# Patient Record
Sex: Female | Born: 1960
Health system: Southern US, Community
[De-identification: ages and names within clinical notes are randomized; demographics above are authoritative.]

## PROBLEM LIST (undated history)

## (undated) DIAGNOSIS — I10 Essential (primary) hypertension: Secondary | ICD-10-CM

## (undated) DIAGNOSIS — M545 Low back pain, unspecified: Secondary | ICD-10-CM

## (undated) DIAGNOSIS — G8929 Other chronic pain: Secondary | ICD-10-CM

## (undated) DIAGNOSIS — G894 Chronic pain syndrome: Secondary | ICD-10-CM

## (undated) DIAGNOSIS — F419 Anxiety disorder, unspecified: Secondary | ICD-10-CM

## (undated) DIAGNOSIS — G2581 Restless legs syndrome: Secondary | ICD-10-CM

## (undated) DIAGNOSIS — E119 Type 2 diabetes mellitus without complications: Secondary | ICD-10-CM

## (undated) DIAGNOSIS — M069 Rheumatoid arthritis, unspecified: Secondary | ICD-10-CM

## (undated) HISTORY — DX: Restless legs syndrome: G25.81

## (undated) HISTORY — DX: Anxiety disorder, unspecified: F41.9

## (undated) HISTORY — DX: Essential (primary) hypertension: I10

## (undated) HISTORY — DX: Rheumatoid arthritis, unspecified: M06.9

---
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
## 2005-10-07 ENCOUNTER — Inpatient Hospital Stay: Payer: Self-pay | Admitting: Endocrinology

## 2005-10-07 ENCOUNTER — Other Ambulatory Visit: Payer: Self-pay

## 2005-10-10 ENCOUNTER — Emergency Department: Payer: Self-pay | Admitting: Unknown Physician Specialty

## 2005-10-10 ENCOUNTER — Other Ambulatory Visit: Payer: Self-pay

## 2005-10-21 ENCOUNTER — Ambulatory Visit: Payer: Self-pay | Admitting: Endocrinology

## 2005-10-26 ENCOUNTER — Ambulatory Visit: Payer: Self-pay | Admitting: Rheumatology

## 2005-12-13 ENCOUNTER — Emergency Department: Payer: Self-pay | Admitting: General Practice

## 2009-01-08 ENCOUNTER — Emergency Department: Payer: Self-pay | Admitting: Emergency Medicine

## 2009-05-03 ENCOUNTER — Emergency Department: Payer: Self-pay | Admitting: Emergency Medicine

## 2009-05-10 ENCOUNTER — Emergency Department: Payer: Self-pay | Admitting: Emergency Medicine

## 2009-05-26 ENCOUNTER — Ambulatory Visit: Payer: Self-pay | Admitting: Neurology

## 2010-04-13 ENCOUNTER — Encounter: Admission: RE | Admit: 2010-04-13 | Discharge: 2010-04-13 | Payer: Self-pay | Admitting: Neurology

## 2010-06-03 ENCOUNTER — Emergency Department: Payer: Self-pay | Admitting: Emergency Medicine

## 2011-04-18 ENCOUNTER — Emergency Department: Payer: Self-pay | Admitting: Emergency Medicine

## 2012-08-17 ENCOUNTER — Emergency Department: Payer: Self-pay | Admitting: Emergency Medicine

## 2012-08-24 ENCOUNTER — Emergency Department: Payer: Self-pay | Admitting: Emergency Medicine

## 2012-08-27 ENCOUNTER — Emergency Department: Payer: Self-pay | Admitting: Emergency Medicine

## 2012-09-23 ENCOUNTER — Emergency Department: Payer: Self-pay | Admitting: Internal Medicine

## 2012-10-12 ENCOUNTER — Emergency Department: Payer: Self-pay | Admitting: Emergency Medicine

## 2012-10-12 LAB — URINALYSIS, COMPLETE
Glucose,UR: NEGATIVE mg/dL (ref 0–75)
Nitrite: NEGATIVE

## 2012-10-12 LAB — CBC
HCT: 40.6 % (ref 35.0–47.0)
HGB: 13.5 g/dL (ref 12.0–16.0)
MCHC: 33.2 g/dL (ref 32.0–36.0)
Platelet: 322 10*3/uL (ref 150–440)

## 2012-10-12 LAB — COMPREHENSIVE METABOLIC PANEL
Albumin: 3.3 g/dL — ABNORMAL LOW (ref 3.4–5.0)
Alkaline Phosphatase: 122 U/L (ref 50–136)
Anion Gap: 8 (ref 7–16)
BUN: 14 mg/dL (ref 7–18)
Bilirubin,Total: 0.4 mg/dL (ref 0.2–1.0)
Co2: 25 mmol/L (ref 21–32)
EGFR (African American): >60
Total Protein: 7.4 g/dL (ref 6.4–8.2)

## 2012-10-12 LAB — LIPASE, BLOOD: Lipase: 123 U/L (ref 73–393)

## 2013-02-04 ENCOUNTER — Emergency Department: Payer: Self-pay | Admitting: Emergency Medicine

## 2013-06-09 ENCOUNTER — Emergency Department: Payer: Self-pay | Admitting: Emergency Medicine

## 2013-06-09 LAB — URINALYSIS, COMPLETE
Leukocyte Esterase: NEGATIVE
Protein: NEGATIVE
RBC,UR: 2 /HPF (ref 0–5)
WBC UR: 1 /HPF (ref 0–5)

## 2013-09-20 ENCOUNTER — Ambulatory Visit (INDEPENDENT_AMBULATORY_CARE_PROVIDER_SITE_OTHER): Payer: 59 | Admitting: Neurology

## 2013-09-20 ENCOUNTER — Encounter: Payer: Self-pay | Admitting: Neurology

## 2013-09-20 ENCOUNTER — Ambulatory Visit: Payer: Self-pay | Admitting: Neurology

## 2013-09-20 VITALS — BP 144/72 | HR 80 | Resp 25 | Ht 68.0 in | Wt 155.0 lb

## 2013-09-20 DIAGNOSIS — G2581 Restless legs syndrome: Secondary | ICD-10-CM | POA: Insufficient documentation

## 2013-09-20 DIAGNOSIS — G609 Hereditary and idiopathic neuropathy, unspecified: Secondary | ICD-10-CM

## 2013-09-20 NOTE — Progress Notes (Signed)
Karen Chen was seen today in neurologic consultation at the request of Dr. Malvin Johns for second opinion re: RLS.  Her PCP is Elmo Putt, MD.  Dr. Geralyn Flash records were reviewed.    The pt presents with her husband who supplements the hx.  The pt reports that 6 years ago, she began with non stop, 24 hour creepy crawly sensation on the legs.  Then she started on the requip, which helped when she went on it initially.  She states that sx's would get better and worse with time and medications were added.  Stalevo was added, and again it was initially helpful.  However, sx's again returned and she was recently taken off of it.  She states that the RLS was got much worse after the stalevo was d/c.  On a side note, ritalin was added for ADD and initially she had so much energy that she forgot about the RLS but then it quit helping her concentration so she d/c that.   She does not think that it affected the RLS.  She remains on klonopin bid and she thinks that it doesn't help at all for the RLS, but it does help the anxiety.  She takes neurontin to help her sleep.  She states that Dr. Geralyn Flash plan is to get her off of the requip and she is worried about that because she cannot sleep without it.  She is on zoloft and has been on that for 2 months.  She is not sure if that made it worse.    The patient has been on numerous medications for restless leg.  She has been on Mirapex, Xanax, Requip, Stalevo, gabapentin, ferrous sulfate, hydrocodone, Pamelor, clonazepam, trazodone, tramadol.  PREVIOUS MEDICATIONS: n/a  ALLERGIES:   Allergies  Allergen Reactions  . Indocin [Indomethacin]   . Methotrexate Derivatives   . Remicade [Infliximab] Hives    CURRENT MEDICATIONS:     Medication List       This list is accurate as of: 09/20/13  3:24 PM.  Always use your most recent med list.               aspirin 81 MG tablet  Take 81 mg by mouth daily.     clonazePAM 1 MG tablet  Commonly known as:  KLONOPIN   Take 1 mg by mouth 3 (three) times daily as needed for anxiety.     gabapentin 300 MG capsule  Commonly known as:  NEURONTIN  Take 300 mg by mouth 3 (three) times daily.     hydrochlorothiazide 12.5 MG tablet  Commonly known as:  HYDRODIURIL  Take 12.5 mg by mouth daily.     ibuprofen 200 MG tablet  Commonly known as:  ADVIL,MOTRIN  Take 200 mg by mouth every 6 (six) hours as needed.     lisinopril 10 MG tablet  Commonly known as:  PRINIVIL,ZESTRIL  Take 10 mg by mouth daily.     POTASSIUM PO  Take by mouth daily.     predniSONE 5 MG tablet  Commonly known as:  DELTASONE  Take 5 mg by mouth daily with breakfast.     rOPINIRole 4 MG tablet  Commonly known as:  REQUIP  Take 4 mg by mouth at bedtime.     sertraline 100 MG tablet  Commonly known as:  ZOLOFT  Take 100 mg by mouth daily.     XELJANZ 5 MG Tabs  Generic drug:  Tofacitinib Citrate  Take 5 mg by mouth every  other day.         PAST MEDICAL HISTORY:   Past Medical History  Diagnosis Date  . Hypertension   . Anxiety   . Rheumatoid arthritis   . Restless leg     PAST SURGICAL HISTORY:   Past Surgical History  Procedure Laterality Date  . Back surgery  1980    SOCIAL HISTORY:   History   Social History  . Marital Status: Unknown    Spouse Name: N/A    Number of Children: N/A  . Years of Education: N/A   Occupational History  . Not on file.   Social History Main Topics  . Smoking status: Current Every Day Smoker -- 1.50 packs/day for 35 years  . Smokeless tobacco: Not on file  . Alcohol Use: No  . Drug Use: No  . Sexual Activity: Not on file   Other Topics Concern  . Not on file   Social History Narrative  . No narrative on file    FAMILY HISTORY:   Family Status  Relation Status Death Age  . Father Deceased     heart disease, alcoholism  . Mother Alive     diabetes, hypertension  . Sister Alive     diabetes, HTN  . Sister Alive     diabetes  . Brother Alive      rheumatoid arthritis  . Brother Alive     shunt in brain, unknown reason  . Brother Alive     HTN  . Daughter Alive     HTN  . Daughter Alive     anxiety    ROS:  A complete 10 system review of systems was obtained and was unremarkable apart from what is mentioned above.  PHYSICAL EXAMINATION:    VITALS:   Filed Vitals:   09/20/13 1247  BP: 144/72  Pulse: 80  Resp: 25  Height: 5\' 8"  (1.727 m)  Weight: 155 lb (70.308 kg)    GEN:  Normal appears female in no acute distress.  Appears stated age. HEENT:  Normocephalic, atraumatic. The mucous membranes are moist. The superficial temporal arteries are without ropiness or tenderness. Cardiovascular: Regular rate and rhythm. Lungs: Clear to auscultation bilaterally. Neck/Heme: There are no carotid bruits noted bilaterally. Musculoskeletal: Hands are deformed, as are the feet.  There is ulnar deviation of the fingers.  NEUROLOGICAL: Orientation:  The patient is alert and oriented x 3.  Fund of knowledge is appropriate.  Recent and remote memory intact.  Attention span and concentration normal.  Repeats and names without difficulty. Cranial nerves: There is good facial symmetry. The pupils are equal round and reactive to light bilaterally. Fundoscopic exam reveals clear disc margins bilaterally. Extraocular muscles are intact and visual fields are full to confrontational testing. Speech is fluent and clear. Soft palate rises symmetrically and there is no tongue deviation. Hearing is intact to conversational tone. Tone: Tone is good throughout. Sensation: Sensation is intact to light touch and pinprick throughout (facial, trunk, extremities).  Pinprick is decreased in a stocking distribution. Vibration is decreased at the bilateral big toe. There is no extinction with double simultaneous stimulation. There is no sensory dermatomal level identified. Coordination:  The patient has no difficulty with RAM's or FNF bilaterally. Motor: Strength  is 5/5 in the bilateral upper and lower extremities.  Shoulder shrug is equal and symmetric. There is no pronator drift.  There are no fasciculations noted. DTR's: Deep tendon reflexes are 2+-3-/4 at the bilateral biceps, triceps, brachioradialis, patella and  1/4 at the bilateral achilles.  Plantar responses are downgoing bilaterally. Gait and Station: The patient is able to ambulate without difficulty.  She is able to walk on his heels but not on the toes, primarily because of deformities from rheumatoid arthritis. The patient is able to ambulate in a tandem fashion. The patient is able to stand in the Romberg position.   IMPRESSION/PLAN  1. restless leg syndrome and paresthesias.  -Dr. Malvin Johns has done a very good workup and I agree with him that the patient is experiencing likely augmentation from exposure to dopaminergic medications.  I agree with him regarding discontinuing Stalevo for a while, as well as trying to discontinue or at least change the Requip.  While doing this, amantadine may be of value.  -Based on her examination I think that she may have a degree of peripheral neuropathy as well, which could be contributing.  She would be at risk for that.  I talked to her in detail about EMG.  I believe Dr. Malvin Johns has discussed this with her as well.  She was very nervous about it.  She is going to think it over, and talk about it further with Dr. Malvin Johns and they can decide if they would like to proceed.  -I. really have nothing further to offer, with the exception of the above recommendations.  She is going to return to Dr. Malvin Johns to discuss these and together they can decide the direction in which they would like to go.

## 2014-03-26 DIAGNOSIS — F419 Anxiety disorder, unspecified: Secondary | ICD-10-CM | POA: Insufficient documentation

## 2014-03-26 DIAGNOSIS — I10 Essential (primary) hypertension: Secondary | ICD-10-CM | POA: Insufficient documentation

## 2014-06-15 ENCOUNTER — Emergency Department: Payer: Self-pay | Admitting: Emergency Medicine

## 2014-06-15 LAB — BASIC METABOLIC PANEL
Anion Gap: 7 (ref 7–16)
BUN: 13 mg/dL (ref 7–18)
CALCIUM: 7.9 mg/dL — AB (ref 8.5–10.1)
CHLORIDE: 105 mmol/L (ref 98–107)
CO2: 28 mmol/L (ref 21–32)
CREATININE: 0.67 mg/dL (ref 0.60–1.30)
EGFR (Non-African Amer.): >60
GLUCOSE: 98 mg/dL (ref 65–99)
OSMOLALITY: 279 (ref 275–301)
POTASSIUM: 3.5 mmol/L (ref 3.5–5.1)
SODIUM: 140 mmol/L (ref 136–145)

## 2014-06-15 LAB — CBC WITH DIFFERENTIAL/PLATELET
BASOS ABS: 0.1 10*3/uL (ref 0.0–0.1)
BASOS PCT: 0.8 %
EOS ABS: 0.1 10*3/uL (ref 0.0–0.7)
EOS PCT: 1.5 %
HCT: 37 % (ref 35.0–47.0)
HGB: 11.9 g/dL — ABNORMAL LOW (ref 12.0–16.0)
LYMPHS PCT: 22.8 %
Lymphocyte #: 2.1 10*3/uL (ref 1.0–3.6)
MCH: 30.3 pg (ref 26.0–34.0)
MCHC: 32.2 g/dL (ref 32.0–36.0)
MCV: 94 fL (ref 80–100)
MONO ABS: 0.7 x10 3/mm (ref 0.2–0.9)
MONOS PCT: 7.2 %
NEUTROS ABS: 6.3 10*3/uL (ref 1.4–6.5)
NEUTROS PCT: 67.7 %
PLATELETS: 270 10*3/uL (ref 150–440)
RBC: 3.93 10*6/uL (ref 3.80–5.20)
RDW: 14 % (ref 11.5–14.5)
WBC: 9.3 10*3/uL (ref 3.6–11.0)

## 2014-06-18 DIAGNOSIS — M059 Rheumatoid arthritis with rheumatoid factor, unspecified: Secondary | ICD-10-CM | POA: Insufficient documentation

## 2014-10-12 ENCOUNTER — Emergency Department: Payer: Self-pay | Admitting: Emergency Medicine

## 2014-10-24 ENCOUNTER — Ambulatory Visit: Payer: Self-pay | Admitting: Orthopedic Surgery

## 2015-03-25 DIAGNOSIS — F909 Attention-deficit hyperactivity disorder, unspecified type: Secondary | ICD-10-CM | POA: Insufficient documentation

## 2015-03-25 DIAGNOSIS — F4310 Post-traumatic stress disorder, unspecified: Secondary | ICD-10-CM | POA: Insufficient documentation

## 2015-03-25 DIAGNOSIS — W57XXXA Bitten or stung by nonvenomous insect and other nonvenomous arthropods, initial encounter: Secondary | ICD-10-CM | POA: Insufficient documentation

## 2015-04-23 DIAGNOSIS — Z79811 Long term (current) use of aromatase inhibitors: Secondary | ICD-10-CM | POA: Insufficient documentation

## 2015-04-23 DIAGNOSIS — I10 Essential (primary) hypertension: Secondary | ICD-10-CM | POA: Insufficient documentation

## 2015-04-23 DIAGNOSIS — Z79899 Other long term (current) drug therapy: Secondary | ICD-10-CM | POA: Diagnosis not present

## 2015-04-23 DIAGNOSIS — L509 Urticaria, unspecified: Secondary | ICD-10-CM | POA: Insufficient documentation

## 2015-04-23 DIAGNOSIS — R21 Rash and other nonspecific skin eruption: Secondary | ICD-10-CM | POA: Diagnosis present

## 2015-04-23 DIAGNOSIS — Z720 Tobacco use: Secondary | ICD-10-CM | POA: Diagnosis not present

## 2015-04-23 DIAGNOSIS — Z7982 Long term (current) use of aspirin: Secondary | ICD-10-CM | POA: Insufficient documentation

## 2015-04-24 ENCOUNTER — Encounter: Payer: Self-pay | Admitting: Emergency Medicine

## 2015-04-24 ENCOUNTER — Emergency Department
Admission: EM | Admit: 2015-04-24 | Discharge: 2015-04-24 | Disposition: A | Discharge status: Home / Self Care | Payer: Medicare Other | Attending: Emergency Medicine | Admitting: Emergency Medicine

## 2015-04-24 DIAGNOSIS — L509 Urticaria, unspecified: Secondary | ICD-10-CM

## 2015-04-24 MED ORDER — PREDNISONE 20 MG PO TABS: 40.0000 mg | ORAL_TABLET | Freq: Once | ORAL | Status: DC

## 2015-04-24 MED ORDER — PREDNISONE 20 MG PO TABS
60.0000 mg | ORAL_TABLET | Freq: Once | ORAL | Status: AC
  Administered 2015-04-24: 60 mg via ORAL

## 2015-04-24 MED ORDER — DIPHENHYDRAMINE HCL 50 MG PO CAPS
50.0000 mg | ORAL_CAPSULE | Freq: Once | ORAL | Status: AC
  Administered 2015-04-24: 50 mg via ORAL

## 2015-04-24 MED ORDER — FAMOTIDINE 20 MG PO TABS
20.0000 mg | ORAL_TABLET | Freq: Once | ORAL | Status: AC
  Administered 2015-04-24: 20 mg via ORAL
  Filled 2015-04-24: qty 1

## 2015-04-24 MED ORDER — DIPHENHYDRAMINE HCL 25 MG PO CAPS
ORAL_CAPSULE | ORAL | Status: AC
  Administered 2015-04-24: 50 mg via ORAL
  Filled 2015-04-24: qty 2

## 2015-04-24 MED ORDER — PREDNISONE 20 MG PO TABS
ORAL_TABLET | ORAL | Status: AC
  Administered 2015-04-24: 60 mg via ORAL
  Filled 2015-04-24: qty 3

## 2015-04-24 NOTE — ED Provider Notes (Signed)
[facility] Emergency Department Provider Note  ____________________________________________  Time seen: 12:30 AM  I have reviewed the triage vital signs and the nursing notes.   HISTORY  Chief Complaint Rash    HPI Karen Chen is a 54 y.o. female [name] with generalized pruritic rash onset approximately 11:00 last night. Patient states rash started on her back and then rapidly spread to her chest and extremities. Patient denies any new change in medication or dietary change. Patient also denies any change in detergents. She denies any difficulty breathing or swallowing     Past Medical History  Diagnosis Date  . Hypertension   . Anxiety   . Rheumatoid arthritis   . Restless leg     Patient Active Problem List   Diagnosis Date Noted  . Restless leg 09/20/2013    Past Surgical History  Procedure Laterality Date  . Back surgery  1980    Current Outpatient Rx  Name  Route  Sig  Dispense  Refill  . aspirin 81 MG tablet   Oral   Take 81 mg by mouth daily.         . clonazePAM (KLONOPIN) 1 MG tablet   Oral   Take 1 mg by mouth 3 (three) times daily as needed for anxiety.         . gabapentin (NEURONTIN) 300 MG capsule   Oral   Take 300 mg by mouth 3 (three) times daily.          . hydrochlorothiazide (HYDRODIURIL) 12.5 MG tablet   Oral   Take 12.5 mg by mouth daily.         Marland Kitchen ibuprofen (ADVIL,MOTRIN) 200 MG tablet   Oral   Take 200 mg by mouth every 6 (six) hours as needed.         Marland Kitchen lisinopril (PRINIVIL,ZESTRIL) 10 MG tablet   Oral   Take 10 mg by mouth daily.          Marland Kitchen POTASSIUM PO   Oral   Take by mouth daily.         . predniSONE (DELTASONE) 5 MG tablet   Oral   Take 5 mg by mouth daily with breakfast.          . rOPINIRole (REQUIP) 4 MG tablet   Oral   Take 4 mg by mouth at bedtime.          . sertraline (ZOLOFT) 100 MG tablet   Oral   Take 100 mg by mouth daily.         . Tofacitinib  Citrate (XELJANZ) 5 MG TABS   Oral   Take 5 mg by mouth every other day.           Allergies Indocin; Methotrexate derivatives; and Remicade  Family History  Problem Relation Age of Onset  . Heart failure Father   . Hypertension Father   . Diabetes Sister     Social History Social History  Substance Use Topics  . Smoking status: Current Every Day Smoker -- 1.00 packs/day for 35 years  . Smokeless tobacco: None  . Alcohol Use: No    Review of Systems  Constitutional: Negative for fever. Eyes: Negative for visual changes. ENT: Negative for sore throat. Cardiovascular: Negative for chest pain. Respiratory: Negative for shortness of breath. Gastrointestinal: Negative for abdominal pain, vomiting and diarrhea. Genitourinary: Negative for dysuria. Musculoskeletal: Negative for back pain. Skin: Positive for rash. Neurological: Negative for headaches, focal weakness or numbness.  10-point ROS otherwise negative.  ____________________________________________   PHYSICAL EXAM:  VITAL SIGNS: ED Triage Vitals  Enc Vitals Group     BP 04/24/15 0030 121/71 mmHg     Pulse Rate 04/24/15 0030 0     Resp --      Temp 04/24/15 0027 97.1 F (36.2 C)     Temp Source 04/24/15 0027 Oral     SpO2 04/24/15 0030 98 %     Weight 04/24/15 0027 145 lb (65.772 kg)     Height 04/24/15 0027 5\' 8"  (1.727 m)     Head Cir --      Peak Flow --      Pain Score 04/24/15 0029 3     Pain Loc --      Pain Edu? --      Excl. in GC? --      Constitutional: Alert and oriented. Well appearing and in no distress. Eyes: Conjunctivae are normal. PERRL. Normal extraocular movements. ENT   Head: Normocephalic and atraumatic.   Nose: No congestion/rhinnorhea.   Mouth/Throat: Mucous membranes are moist.   Neck: No stridor. Hematological/Lymphatic/Immunilogical: No cervical lymphadenopathy. Cardiovascular: Normal rate, regular rhythm. Normal and symmetric distal pulses are present  in all extremities. No murmurs, rubs, or gallops. Respiratory: Normal respiratory effort without tachypnea nor retractions. Breath sounds are clear and equal bilaterally. No wheezes/rales/rhonchi. Gastrointestinal: Soft and nontender. No distention. There is no CVA tenderness. Genitourinary: deferred Musculoskeletal: Nontender with normal range of motion in all extremities. No joint effusions.  No lower extremity tenderness nor edema. Neurologic:  Normal speech and language. No gross focal neurologic deficits are appreciated. Speech is normal.  Skin:  Generalized hives noted on the patient's chest back bilateral lower upper extremities. Psychiatric: Mood and affect are normal. Speech and behavior are normal. Patient exhibits appropriate insight and judgment.     INITIAL IMPRESSION / ASSESSMENT AND PLAN / ED COURSE  Pertinent labs & imaging results that were available during my care of the patient were reviewed by me and considered in my medical decision making (see chart for details).  Patient received Benadryl prednisone and Pepcid with considerable improvement in hives.  ____________________________________________   FINAL CLINICAL IMPRESSION(S) / ED DIAGNOSES  Final diagnoses:  Hives      04/26/15, MD 04/24/15 [phone]

## 2015-04-24 NOTE — ED Notes (Signed)
Pt. States at around 11 pm this evening a rash started on back.  Pt. States it itches.  Pt. Denies SOB.  Pt. Has rash on all extremities and trunk.   Pt. Denies any change  In medication or cleaning detergents.  Pt. Denies change in diet.

## 2015-04-24 NOTE — Discharge Instructions (Signed)
Hives Hives are itchy, red, swollen areas of the skin. They can vary in size and location on your body. Hives can come and go for hours or several days (acute hives) or for several weeks (chronic hives). Hives do not spread from person to person (noncontagious). They may get worse with scratching, exercise, and emotional stress. CAUSES   Allergic reaction to food, additives, or drugs.  Infections, including the common cold.  Illness, such as vasculitis, lupus, or thyroid disease.  Exposure to sunlight, heat, or cold.  Exercise.  Stress.  Contact with chemicals. SYMPTOMS   Red or white swollen patches on the skin. The patches may change size, shape, and location quickly and repeatedly.  Itching.  Swelling of the hands, feet, and face. This may occur if hives develop deeper in the skin. DIAGNOSIS  Your caregiver can usually tell what is wrong by performing a physical exam. Skin or blood tests may also be done to determine the cause of your hives. In some cases, the cause cannot be determined. TREATMENT  Mild cases usually get better with medicines such as antihistamines. Severe cases may require an emergency epinephrine injection. If the cause of your hives is known, treatment includes avoiding that trigger.  HOME CARE INSTRUCTIONS   Avoid causes that trigger your hives.  Take antihistamines as directed by your caregiver to reduce the severity of your hives. Non-sedating or low-sedating antihistamines are usually recommended. Do not drive while taking an antihistamine.  Take any other medicines prescribed for itching as directed by your caregiver.  Wear loose-fitting clothing.  Keep all follow-up appointments as directed by your caregiver. SEEK MEDICAL CARE IF:   You have persistent or severe itching that is not relieved with medicine.  You have painful or swollen joints. SEEK IMMEDIATE MEDICAL CARE IF:   You have a fever.  Your tongue or lips are swollen.  You have  trouble breathing or swallowing.  You feel tightness in the throat or chest.  You have abdominal pain. These problems may be the first sign of a life-threatening allergic reaction. Call your local emergency services (911 in [place] MAKE SURE YOU:   Understand these instructions.  Will watch your condition.  Will get help right away if you are not doing well or get worse. Document Released: 08/15/2005 Document Revised: 08/20/2013 Document Reviewed: 11/08/2011 [employer] Patient Information 2015 [employer]. This information is not intended to replace advice given to you by your health care provider. Make sure you discuss any questions you have with your health care provider.  

## 2015-07-02 DIAGNOSIS — J3089 Other allergic rhinitis: Secondary | ICD-10-CM | POA: Diagnosis not present

## 2015-07-02 DIAGNOSIS — J3081 Allergic rhinitis due to animal (cat) (dog) hair and dander: Secondary | ICD-10-CM | POA: Diagnosis not present

## 2015-07-02 DIAGNOSIS — L509 Urticaria, unspecified: Secondary | ICD-10-CM | POA: Diagnosis not present

## 2015-07-02 DIAGNOSIS — F17200 Nicotine dependence, unspecified, uncomplicated: Secondary | ICD-10-CM | POA: Diagnosis not present

## 2015-07-03 DIAGNOSIS — M2041 Other hammer toe(s) (acquired), right foot: Secondary | ICD-10-CM | POA: Diagnosis not present

## 2015-07-03 DIAGNOSIS — M2042 Other hammer toe(s) (acquired), left foot: Secondary | ICD-10-CM | POA: Diagnosis not present

## 2015-07-03 DIAGNOSIS — L97512 Non-pressure chronic ulcer of other part of right foot with fat layer exposed: Secondary | ICD-10-CM | POA: Diagnosis not present

## 2015-07-03 DIAGNOSIS — M7751 Other enthesopathy of right foot: Secondary | ICD-10-CM | POA: Diagnosis not present

## 2015-07-03 DIAGNOSIS — L720 Epidermal cyst: Secondary | ICD-10-CM | POA: Diagnosis not present

## 2015-07-03 DIAGNOSIS — M810 Age-related osteoporosis without current pathological fracture: Secondary | ICD-10-CM | POA: Diagnosis not present

## 2015-08-02 DIAGNOSIS — R0781 Pleurodynia: Secondary | ICD-10-CM | POA: Diagnosis not present

## 2015-08-11 DIAGNOSIS — Z124 Encounter for screening for malignant neoplasm of cervix: Secondary | ICD-10-CM | POA: Diagnosis not present

## 2015-08-11 DIAGNOSIS — Z0001 Encounter for general adult medical examination with abnormal findings: Secondary | ICD-10-CM | POA: Diagnosis not present

## 2015-08-11 DIAGNOSIS — F17210 Nicotine dependence, cigarettes, uncomplicated: Secondary | ICD-10-CM | POA: Diagnosis not present

## 2015-08-11 DIAGNOSIS — R87810 Cervical high risk human papillomavirus (HPV) DNA test positive: Secondary | ICD-10-CM | POA: Diagnosis not present

## 2015-08-11 DIAGNOSIS — I739 Peripheral vascular disease, unspecified: Secondary | ICD-10-CM | POA: Diagnosis not present

## 2015-08-11 DIAGNOSIS — F411 Generalized anxiety disorder: Secondary | ICD-10-CM | POA: Diagnosis not present

## 2015-08-11 DIAGNOSIS — I10 Essential (primary) hypertension: Secondary | ICD-10-CM | POA: Diagnosis not present

## 2015-08-11 DIAGNOSIS — E559 Vitamin D deficiency, unspecified: Secondary | ICD-10-CM | POA: Diagnosis not present

## 2015-08-11 DIAGNOSIS — G2581 Restless legs syndrome: Secondary | ICD-10-CM | POA: Diagnosis not present

## 2015-08-17 DIAGNOSIS — N6489 Other specified disorders of breast: Secondary | ICD-10-CM | POA: Diagnosis not present

## 2015-08-17 DIAGNOSIS — Z1231 Encounter for screening mammogram for malignant neoplasm of breast: Secondary | ICD-10-CM | POA: Diagnosis not present

## 2015-08-26 DIAGNOSIS — G2581 Restless legs syndrome: Secondary | ICD-10-CM | POA: Diagnosis not present

## 2015-09-01 DIAGNOSIS — N63 Unspecified lump in breast: Secondary | ICD-10-CM | POA: Diagnosis not present

## 2015-09-03 DIAGNOSIS — M0579 Rheumatoid arthritis with rheumatoid factor of multiple sites without organ or systems involvement: Secondary | ICD-10-CM | POA: Diagnosis not present

## 2015-09-03 DIAGNOSIS — L508 Other urticaria: Secondary | ICD-10-CM | POA: Diagnosis not present

## 2015-09-03 DIAGNOSIS — M79641 Pain in right hand: Secondary | ICD-10-CM | POA: Diagnosis not present

## 2015-10-06 DIAGNOSIS — R110 Nausea: Secondary | ICD-10-CM | POA: Diagnosis not present

## 2015-10-06 DIAGNOSIS — F17210 Nicotine dependence, cigarettes, uncomplicated: Secondary | ICD-10-CM | POA: Diagnosis not present

## 2015-10-06 DIAGNOSIS — M069 Rheumatoid arthritis, unspecified: Secondary | ICD-10-CM | POA: Diagnosis not present

## 2015-10-06 DIAGNOSIS — I10 Essential (primary) hypertension: Secondary | ICD-10-CM | POA: Diagnosis not present

## 2015-10-06 DIAGNOSIS — G2581 Restless legs syndrome: Secondary | ICD-10-CM | POA: Diagnosis not present

## 2015-10-06 DIAGNOSIS — M150 Primary generalized (osteo)arthritis: Secondary | ICD-10-CM | POA: Diagnosis not present

## 2015-10-09 DIAGNOSIS — Z79899 Other long term (current) drug therapy: Secondary | ICD-10-CM | POA: Diagnosis not present

## 2015-10-13 DIAGNOSIS — G8929 Other chronic pain: Secondary | ICD-10-CM | POA: Diagnosis not present

## 2015-10-13 DIAGNOSIS — M810 Age-related osteoporosis without current pathological fracture: Secondary | ICD-10-CM | POA: Diagnosis not present

## 2015-10-13 DIAGNOSIS — M545 Low back pain: Secondary | ICD-10-CM | POA: Diagnosis not present

## 2015-10-13 DIAGNOSIS — M0579 Rheumatoid arthritis with rheumatoid factor of multiple sites without organ or systems involvement: Secondary | ICD-10-CM | POA: Diagnosis not present

## 2015-10-27 DIAGNOSIS — M0579 Rheumatoid arthritis with rheumatoid factor of multiple sites without organ or systems involvement: Secondary | ICD-10-CM | POA: Diagnosis not present

## 2015-10-27 DIAGNOSIS — M810 Age-related osteoporosis without current pathological fracture: Secondary | ICD-10-CM | POA: Diagnosis not present

## 2015-11-25 DIAGNOSIS — L97511 Non-pressure chronic ulcer of other part of right foot limited to breakdown of skin: Secondary | ICD-10-CM | POA: Diagnosis not present

## 2016-01-05 DIAGNOSIS — M069 Rheumatoid arthritis, unspecified: Secondary | ICD-10-CM | POA: Diagnosis not present

## 2016-01-05 DIAGNOSIS — I10 Essential (primary) hypertension: Secondary | ICD-10-CM | POA: Diagnosis not present

## 2016-01-05 DIAGNOSIS — F411 Generalized anxiety disorder: Secondary | ICD-10-CM | POA: Diagnosis not present

## 2016-01-05 DIAGNOSIS — G2581 Restless legs syndrome: Secondary | ICD-10-CM | POA: Diagnosis not present

## 2016-01-05 DIAGNOSIS — F17210 Nicotine dependence, cigarettes, uncomplicated: Secondary | ICD-10-CM | POA: Diagnosis not present

## 2016-01-08 DIAGNOSIS — K210 Gastro-esophageal reflux disease with esophagitis: Secondary | ICD-10-CM | POA: Diagnosis not present

## 2016-01-08 DIAGNOSIS — M0579 Rheumatoid arthritis with rheumatoid factor of multiple sites without organ or systems involvement: Secondary | ICD-10-CM | POA: Diagnosis not present

## 2016-01-08 DIAGNOSIS — M25512 Pain in left shoulder: Secondary | ICD-10-CM | POA: Diagnosis not present

## 2016-03-10 DIAGNOSIS — M25512 Pain in left shoulder: Secondary | ICD-10-CM | POA: Diagnosis not present

## 2016-03-10 DIAGNOSIS — M810 Age-related osteoporosis without current pathological fracture: Secondary | ICD-10-CM | POA: Diagnosis not present

## 2016-03-10 DIAGNOSIS — M0579 Rheumatoid arthritis with rheumatoid factor of multiple sites without organ or systems involvement: Secondary | ICD-10-CM | POA: Diagnosis not present

## 2016-03-31 DIAGNOSIS — M069 Rheumatoid arthritis, unspecified: Secondary | ICD-10-CM | POA: Diagnosis not present

## 2016-03-31 DIAGNOSIS — F411 Generalized anxiety disorder: Secondary | ICD-10-CM | POA: Diagnosis not present

## 2016-03-31 DIAGNOSIS — I10 Essential (primary) hypertension: Secondary | ICD-10-CM | POA: Diagnosis not present

## 2016-03-31 DIAGNOSIS — F17210 Nicotine dependence, cigarettes, uncomplicated: Secondary | ICD-10-CM | POA: Diagnosis not present

## 2016-03-31 DIAGNOSIS — G2581 Restless legs syndrome: Secondary | ICD-10-CM | POA: Diagnosis not present

## 2016-04-13 DIAGNOSIS — M069 Rheumatoid arthritis, unspecified: Secondary | ICD-10-CM | POA: Diagnosis not present

## 2016-04-13 DIAGNOSIS — I10 Essential (primary) hypertension: Secondary | ICD-10-CM | POA: Diagnosis not present

## 2016-04-13 DIAGNOSIS — G2581 Restless legs syndrome: Secondary | ICD-10-CM | POA: Diagnosis not present

## 2016-04-13 DIAGNOSIS — Z0000 Encounter for general adult medical examination without abnormal findings: Secondary | ICD-10-CM | POA: Diagnosis not present

## 2016-04-13 DIAGNOSIS — Z0001 Encounter for general adult medical examination with abnormal findings: Secondary | ICD-10-CM | POA: Diagnosis not present

## 2016-04-20 ENCOUNTER — Inpatient Hospital Stay: Payer: Medicare Other | Admitting: Hematology and Oncology

## 2016-04-22 ENCOUNTER — Ambulatory Visit: Payer: Medicare Other | Admitting: Oncology

## 2016-05-04 ENCOUNTER — Inpatient Hospital Stay: Payer: Medicare Other | Attending: Hematology and Oncology | Admitting: Hematology and Oncology

## 2016-05-04 ENCOUNTER — Encounter: Payer: Self-pay | Admitting: Hematology and Oncology

## 2016-05-04 ENCOUNTER — Inpatient Hospital Stay: Payer: Medicare Other

## 2016-05-04 DIAGNOSIS — G2581 Restless legs syndrome: Secondary | ICD-10-CM | POA: Insufficient documentation

## 2016-05-04 DIAGNOSIS — I10 Essential (primary) hypertension: Secondary | ICD-10-CM | POA: Insufficient documentation

## 2016-05-04 DIAGNOSIS — M069 Rheumatoid arthritis, unspecified: Secondary | ICD-10-CM

## 2016-05-04 DIAGNOSIS — F419 Anxiety disorder, unspecified: Secondary | ICD-10-CM | POA: Insufficient documentation

## 2016-05-04 DIAGNOSIS — Z7982 Long term (current) use of aspirin: Secondary | ICD-10-CM | POA: Insufficient documentation

## 2016-05-04 DIAGNOSIS — F17210 Nicotine dependence, cigarettes, uncomplicated: Secondary | ICD-10-CM | POA: Diagnosis not present

## 2016-05-04 DIAGNOSIS — M0579 Rheumatoid arthritis with rheumatoid factor of multiple sites without organ or systems involvement: Secondary | ICD-10-CM | POA: Insufficient documentation

## 2016-05-04 DIAGNOSIS — Z79899 Other long term (current) drug therapy: Secondary | ICD-10-CM

## 2016-05-04 LAB — CBC WITH DIFFERENTIAL/PLATELET
Basophils Absolute: 0.1 10*3/uL (ref 0–0.1)
Basophils Relative: 1 %
Eosinophils Absolute: 0.2 10*3/uL (ref 0–0.7)
Eosinophils Relative: 2 %
HCT: 37.1 % (ref 35.0–47.0)
Hemoglobin: 12.6 g/dL (ref 12.0–16.0)
Lymphocytes Relative: 26 %
Lymphs Abs: 2.8 10*3/uL (ref 1.0–3.6)
MCH: 31.4 pg (ref 26.0–34.0)
MCHC: 34 g/dL (ref 32.0–36.0)
MCV: 92.3 fL (ref 80.0–100.0)
Monocytes Absolute: 0.5 10*3/uL (ref 0.2–0.9)
Monocytes Relative: 5 %
Neutro Abs: 7.1 10*3/uL — ABNORMAL HIGH (ref 1.4–6.5)
Neutrophils Relative %: 66 %
Platelets: 306 10*3/uL (ref 150–440)
RBC: 4.02 MIL/uL (ref 3.80–5.20)
RDW: 14.1 % (ref 11.5–14.5)
WBC: 10.7 10*3/uL (ref 3.6–11.0)

## 2016-05-04 LAB — COMPREHENSIVE METABOLIC PANEL
ALT: 18 U/L (ref 14–54)
AST: 15 U/L (ref 15–41)
Albumin: 3.3 g/dL — ABNORMAL LOW (ref 3.5–5.0)
Alkaline Phosphatase: 85 U/L (ref 38–126)
Anion gap: 7 (ref 5–15)
BUN: 16 mg/dL (ref 6–20)
CO2: 29 mmol/L (ref 22–32)
Calcium: 8.8 mg/dL — ABNORMAL LOW (ref 8.9–10.3)
Chloride: 104 mmol/L (ref 101–111)
Creatinine, Ser: 0.86 mg/dL (ref 0.44–1.00)
GFR calc Af Amer: >60 mL/min (ref >60)
GFR calc non Af Amer: >60 mL/min (ref >60)
Glucose, Bld: 104 mg/dL — ABNORMAL HIGH (ref 65–99)
Potassium: 3.6 mmol/L (ref 3.5–5.1)
Sodium: 140 mmol/L (ref 135–145)
Total Bilirubin: <0.1 mg/dL — ABNORMAL LOW (ref 0.3–1.2)
Total Protein: 6.6 g/dL (ref 6.5–8.1)

## 2016-05-04 NOTE — Progress Notes (Addendum)
[facility] day:  05/04/2016  Chief Complaint: Karen Chen is a 55 y.o. female with rheumatoid arthritis who is referred in consultation by Dr. Jefm Bryant for Rituxan.  HPI:  The patient has had rheumatoid arthritis for 25 years.  Notes indicate she has erosive, seropositive rheumatoid arthritis. She has had pericarditis. She has received methotrexate (oral and IV).  She was on Enbrel.  She states Enbrel initially helped.  Leflunomide caused GI upset.  Sulfasalazine was stopped because of pill size.  Remicade caused watery eyes and itching.  Gwenlyn Fudge caused hives and shortness of breath.  Morrie Sheldon caused a rash and swallowing issues.  Symptomatically, the patient notes that her joints hurt. She can't open containers or drinks. She is in excruciating pain. In general, she doesn't feel good. Her neck and feet hurt. She sleeps poorly at night.  She notes that her weight is stable.   Past Medical History:  Diagnosis Date  . Anxiety   . Hypertension   . Restless leg   . Rheumatoid arthritis [facility])     Past Surgical History:  Procedure Laterality Date  . BACK SURGERY  1980    Family History  Problem Relation Age of Onset  . Heart failure Father   . Hypertension Father   . Diabetes Sister     Social History:  reports that she has been smoking.  She has a 35.00 pack-year smoking history. She does not have any smokeless tobacco history on file. She reports that she does not drink alcohol or use drugs. She smokes 1 pack a day.  She does not drink alcohol.  Allergies:  Allergies  Allergen Reactions  . Orencia [Abatacept] Anaphylaxis  . Sulfa Antibiotics Anaphylaxis  . Azathioprine Other (See Comments)  . Indocin [Indomethacin]   . Methotrexate Derivatives   . Remicade [Infliximab] Hives  . Adalimumab Itching and Rash    Current Medications: Current Outpatient Prescriptions  Medication Sig Dispense Refill  . ALPRAZolam (XANAX) 0.5 MG  tablet     . aspirin 81 MG tablet Take 81 mg by mouth daily.    . DULoxetine (CYMBALTA) 30 MG capsule     . gabapentin (NEURONTIN) 300 MG capsule Take 300 mg by mouth 3 (three) times daily.     . hydrochlorothiazide (HYDRODIURIL) 12.5 MG tablet Take 12.5 mg by mouth daily.    Marland Kitchen ibuprofen (ADVIL,MOTRIN) 200 MG tablet Take 200 mg by mouth every 6 (six) hours as needed.    Marland Kitchen lisinopril (PRINIVIL,ZESTRIL) 10 MG tablet Take 10 mg by mouth daily.     Marland Kitchen POTASSIUM PO Take by mouth daily.    . predniSONE (DELTASONE) 5 MG tablet Take 5 mg by mouth daily with breakfast.     . rOPINIRole (REQUIP) 4 MG tablet Take 4 mg by mouth at bedtime.      No current facility-administered medications for this visit.     Review of Systems:  GENERAL:  Doesn't feel good.  Feels cold sometimes.  No fevers, sweats or weight loss. PERFORMANCE STATUS (ECOG):  2 HEENT:  No visual changes, runny nose, sore throat, mouth sores or tenderness. Lungs: No shortness of breath or cough.  No hemoptysis. Cardiac:  No chest pain, palpitations, orthopnea, or PND. GI:  Difficulty swallowing pills.  No nausea, vomiting, diarrhea, constipation, melena or hematochezia.  No prior colonoscopy. GU:  No urgency, frequency, dysuria, or hematuria. Musculoskeletal:  Joints hurt (see HPI).  Neck and feet hurt.  No muscle tenderness.  Extremities:  No pain or swelling. Skin:  No rashes or skin changes. Neuro:  No headache, numbness or weakness, balance or coordination issues.  Restless leg. Endocrine:  No diabetes, thyroid issues, hot flashes or night sweats. Psych:  Poor sleep.  No mood changes, depression or anxiety. Pain:  No focal pain. Review of systems:  All other systems reviewed and found to be negative.  Physical Exam: Blood pressure 134/75, pulse 84, temperature 97.4 F (36.3 C), temperature source Tympanic, resp. rate 18, height '5\' 8"'$  (1.727 m), weight 147 lb 14.9 oz (67.1 kg). GENERAL:  Chronically ill appearing woman sitting  comfortably in the exam room in no acute distress. MENTAL STATUS:  Alert and oriented to person, place and time. HEAD:  Short brown hair.  Normocephalic, atraumatic, face symmetric, no Cushingoid features. EYES:  Blue eyes.  Pupils equal round and reactive to light and accomodation.  No conjunctivitis or scleral icterus. ENT:  Oropharynx clear without lesion.  Upper dentures.  Tongue normal. Mucous membranes moist.  RESPIRATORY:  Poor respiratory excursion.  Intermittent cough.  Clear to auscultation without rales, wheezes or rhonchi. CARDIOVASCULAR:  Regular rate and rhythm without murmur, rub or gallop. ABDOMEN:  Soft, non-tender, with active bowel sounds, and no hepatosplenomegaly.  No masses. SKIN:  Elbow nodules (right > left).  No rashes, ulcers or lesions. EXTREMITIES: Ulnar deviation of fingers. Trace lower extremity tibial edema.  No skin discoloration or tenderness.  No palpable cords. LYMPH NODES: No palpable cervical, supraclavicular, axillary or inguinal adenopathy  NEUROLOGICAL: Unremarkable. PSYCH:  Appropriate.   Appointment on 05/04/2016  Component Date Value Ref Range Status  . WBC 05/04/2016 10.7  3.6 - 11.0 K/uL Final  . RBC 05/04/2016 4.02  3.80 - 5.20 MIL/uL Final  . Hemoglobin 05/04/2016 12.6  12.0 - 16.0 g/dL Final  . HCT 05/04/2016 37.1  35.0 - 47.0 % Final  . MCV 05/04/2016 92.3  80.0 - 100.0 fL Final  . MCH 05/04/2016 31.4  26.0 - 34.0 pg Final  . MCHC 05/04/2016 34.0  32.0 - 36.0 g/dL Final  . RDW 05/04/2016 14.1  11.5 - 14.5 % Final  . Platelets 05/04/2016 306  150 - 440 K/uL Final  . Neutrophils Relative % 05/04/2016 66  % Final  . Neutro Abs 05/04/2016 7.1* 1.4 - 6.5 K/uL Final  . Lymphocytes Relative 05/04/2016 26  % Final  . Lymphs Abs 05/04/2016 2.8  1.0 - 3.6 K/uL Final  . Monocytes Relative 05/04/2016 5  % Final  . Monocytes Absolute 05/04/2016 0.5  0.2 - 0.9 K/uL Final  . Eosinophils Relative 05/04/2016 2  % Final  . Eosinophils Absolute  05/04/2016 0.2  0 - 0.7 K/uL Final  . Basophils Relative 05/04/2016 1  % Final  . Basophils Absolute 05/04/2016 0.1  0 - 0.1 K/uL Final  . Sodium 05/04/2016 140  135 - 145 mmol/L Final  . Potassium 05/04/2016 3.6  3.5 - 5.1 mmol/L Final  . Chloride 05/04/2016 104  101 - 111 mmol/L Final  . CO2 05/04/2016 29  22 - 32 mmol/L Final  . Glucose, Bld 05/04/2016 104* 65 - 99 mg/dL Final  . BUN 05/04/2016 16  6 - 20 mg/dL Final  . Creatinine, Ser 05/04/2016 0.86  0.44 - 1.00 mg/dL Final  . Calcium 05/04/2016 8.8* 8.9 - 10.3 mg/dL Final  . Total Protein 05/04/2016 6.6  6.5 - 8.1 g/dL Final  . Albumin 05/04/2016 3.3* 3.5 - 5.0 g/dL Final  . AST 05/04/2016 15  15 -  41 U/L Final  . ALT 05/04/2016 18  14 - 54 U/L Final  . Alkaline Phosphatase 05/04/2016 85  38 - 126 U/L Final  . Total Bilirubin 05/04/2016 <0.1* 0.3 - 1.2 mg/dL Final  . GFR calc non Af Amer 05/04/2016 >60  >60 mL/min Final  . GFR calc Af Amer 05/04/2016 >60  >60 mL/min Final   Comment: (NOTE) The eGFR has been calculated using the CKD EPI equation. This calculation has not been validated in all clinical situations. eGFR's persistently <60 mL/min signify possible Chronic Kidney Disease.   . Anion gap 05/04/2016 7  5 - 15 Final  . HCV Ab 05/05/2016 0.4  0.0 - 0.9 s/co ratio Final   Comment: (NOTE)                                  Negative:     < 0.8                             Indeterminate: 0.8 - 0.9                                  Positive:     > 0.9 The CDC recommends that a positive HCV antibody result be followed up with a HCV Nucleic Acid Amplification test ([id number]). Performed At: [facility] [place], [place] [id number] Lindon Romp MD FO:[id number]   . Hepatitis B Surface Ag 05/05/2016 Negative  Negative Final   Comment: (NOTE) Performed At: [facility] [place], [place] [id number] Lindon Romp MD JG:[id number]   . Hep B Core Total Ab 05/05/2016 Negative   Negative Final   Comment: (NOTE) Performed At: [facility] [place], [place] [id number] Lindon Romp MD FK:[id number]     Assessment:  Atina Feeley is a 55 y.o. female with severe rheumatoid arthritis.  She has been on multiple agents including methotrexate, Enbrel, leflunomide, sulfasalazine, Remicade, Hanley Seamen.  She has been allergic to multiple drugs in the past.  She has developed hives (Remicade) and anaphylaxis (sulfa and Orencia).   Symptomatically, she has significant joint pain secondary to rheumatoid arthritis.  Plan: 1.  Discuss administration of Rituxan.  Discussed weekly infusion x 2 spaced 2 weeks apart.  Discussed premedication with Solumedrol, Tylenol, and Benadryl.  Discussed potential side effects associated with Rituxan.  Most people tolerate extremely well.  We discussed small risk of allergic reactions.  Discuss administering Rituxan in [place] rather than [place] given her history of medications reactions.  Discuss checking for hepatitis virus secondary to risk of reactivation if patient has had hepatitis B.  Overall, discussed typically well tolerated medication. 2.  Labs today:  CBC with diff, CMP, hepatitis B core antibody total, hepatitis B surface antigen, hepatitis C antibody. 3.  Preauth Rituxan.  Plan Rituxan 1000 mg on days 1 and 15 with subsequent courses every 16-24 weeks (based on clinical evaluation).  Premedication with Solumedrol 100 mg IV, Tylenol 650 mg po, and Benadryl 50 mg po. 4.  RTC for MD assessment and week 1 Rituxan.  Addendum:  Spoke with Dr. Jefm Bryant after her visit.  Patient is considering other treatment options and will postpone administration of Rituxan at this time.   Lequita Asal, MD  05/04/2016

## 2016-05-05 LAB — HEPATITIS C ANTIBODY: HCV Ab: 0.4 s/co ratio (ref 0.0–0.9)

## 2016-05-05 LAB — HEPATITIS B SURFACE ANTIGEN: Hepatitis B Surface Ag: NEGATIVE

## 2016-05-05 LAB — HEPATITIS B CORE ANTIBODY, TOTAL: Hep B Core Total Ab: NEGATIVE

## 2016-05-09 DIAGNOSIS — M0579 Rheumatoid arthritis with rheumatoid factor of multiple sites without organ or systems involvement: Secondary | ICD-10-CM | POA: Diagnosis not present

## 2016-05-11 ENCOUNTER — Other Ambulatory Visit: Payer: Self-pay | Admitting: Hematology and Oncology

## 2016-05-12 ENCOUNTER — Inpatient Hospital Stay: Payer: Medicare Other

## 2016-05-12 ENCOUNTER — Other Ambulatory Visit: Payer: Medicare Other

## 2016-05-12 ENCOUNTER — Inpatient Hospital Stay: Payer: Medicare Other | Admitting: Hematology and Oncology

## 2016-05-12 ENCOUNTER — Telehealth: Payer: Self-pay | Admitting: *Deleted

## 2016-05-12 NOTE — Telephone Encounter (Signed)
Called pt just to confirm that she is not coming today. Dr. Merlene Pulling spoke to Dr. Gavin Potters about pt and how she has reactions to all the drugs given to her for rheumatoid arthritis.  They are going to try injections first in [place] office and if they need our help in future then Dr. Gavin Potters will contact us.  Patient and clinic on the same page and she ill not have any more appts unless called in future.

## 2016-05-28 ENCOUNTER — Encounter: Payer: Self-pay | Admitting: Hematology and Oncology

## 2016-07-12 DIAGNOSIS — M069 Rheumatoid arthritis, unspecified: Secondary | ICD-10-CM | POA: Diagnosis not present

## 2016-07-12 DIAGNOSIS — I10 Essential (primary) hypertension: Secondary | ICD-10-CM | POA: Diagnosis not present

## 2016-07-12 DIAGNOSIS — G2581 Restless legs syndrome: Secondary | ICD-10-CM | POA: Diagnosis not present

## 2016-08-23 DIAGNOSIS — G2581 Restless legs syndrome: Secondary | ICD-10-CM | POA: Diagnosis not present

## 2016-08-23 DIAGNOSIS — M150 Primary generalized (osteo)arthritis: Secondary | ICD-10-CM | POA: Diagnosis not present

## 2016-08-23 DIAGNOSIS — I10 Essential (primary) hypertension: Secondary | ICD-10-CM | POA: Diagnosis not present

## 2016-08-23 DIAGNOSIS — J4520 Mild intermittent asthma, uncomplicated: Secondary | ICD-10-CM | POA: Diagnosis not present

## 2016-09-12 DIAGNOSIS — M0579 Rheumatoid arthritis with rheumatoid factor of multiple sites without organ or systems involvement: Secondary | ICD-10-CM | POA: Diagnosis not present

## 2016-09-12 DIAGNOSIS — M5416 Radiculopathy, lumbar region: Secondary | ICD-10-CM | POA: Diagnosis not present

## 2016-09-12 DIAGNOSIS — M810 Age-related osteoporosis without current pathological fracture: Secondary | ICD-10-CM | POA: Diagnosis not present

## 2016-09-13 ENCOUNTER — Other Ambulatory Visit: Payer: Self-pay | Admitting: Rheumatology

## 2016-09-13 DIAGNOSIS — M5416 Radiculopathy, lumbar region: Secondary | ICD-10-CM

## 2016-09-15 ENCOUNTER — Encounter: Payer: Self-pay | Admitting: Emergency Medicine

## 2016-09-15 ENCOUNTER — Observation Stay
Admission: EM | Admit: 2016-09-15 | Discharge: 2016-09-16 | Disposition: A | Discharge status: Home / Self Care | Payer: Medicare Other | Attending: Internal Medicine | Admitting: Internal Medicine

## 2016-09-15 ENCOUNTER — Emergency Department: Payer: Medicare Other

## 2016-09-15 DIAGNOSIS — I10 Essential (primary) hypertension: Secondary | ICD-10-CM | POA: Diagnosis not present

## 2016-09-15 DIAGNOSIS — I6523 Occlusion and stenosis of bilateral carotid arteries: Secondary | ICD-10-CM | POA: Insufficient documentation

## 2016-09-15 DIAGNOSIS — M545 Low back pain: Secondary | ICD-10-CM | POA: Diagnosis not present

## 2016-09-15 DIAGNOSIS — N390 Urinary tract infection, site not specified: Secondary | ICD-10-CM | POA: Diagnosis not present

## 2016-09-15 DIAGNOSIS — R531 Weakness: Principal | ICD-10-CM | POA: Insufficient documentation

## 2016-09-15 DIAGNOSIS — I6359 Cerebral infarction due to unspecified occlusion or stenosis of other cerebral artery: Secondary | ICD-10-CM

## 2016-09-15 DIAGNOSIS — G629 Polyneuropathy, unspecified: Secondary | ICD-10-CM | POA: Diagnosis not present

## 2016-09-15 DIAGNOSIS — M48061 Spinal stenosis, lumbar region without neurogenic claudication: Secondary | ICD-10-CM | POA: Insufficient documentation

## 2016-09-15 DIAGNOSIS — J323 Chronic sphenoidal sinusitis: Secondary | ICD-10-CM | POA: Insufficient documentation

## 2016-09-15 DIAGNOSIS — G2581 Restless legs syndrome: Secondary | ICD-10-CM | POA: Insufficient documentation

## 2016-09-15 DIAGNOSIS — G8929 Other chronic pain: Secondary | ICD-10-CM | POA: Diagnosis not present

## 2016-09-15 DIAGNOSIS — Z888 Allergy status to other drugs, medicaments and biological substances status: Secondary | ICD-10-CM | POA: Diagnosis not present

## 2016-09-15 DIAGNOSIS — F17210 Nicotine dependence, cigarettes, uncomplicated: Secondary | ICD-10-CM | POA: Insufficient documentation

## 2016-09-15 DIAGNOSIS — R200 Anesthesia of skin: Secondary | ICD-10-CM | POA: Diagnosis not present

## 2016-09-15 DIAGNOSIS — Z7982 Long term (current) use of aspirin: Secondary | ICD-10-CM | POA: Insufficient documentation

## 2016-09-15 DIAGNOSIS — G459 Transient cerebral ischemic attack, unspecified: Secondary | ICD-10-CM | POA: Diagnosis present

## 2016-09-15 DIAGNOSIS — M069 Rheumatoid arthritis, unspecified: Secondary | ICD-10-CM | POA: Diagnosis not present

## 2016-09-15 DIAGNOSIS — R600 Localized edema: Secondary | ICD-10-CM | POA: Insufficient documentation

## 2016-09-15 DIAGNOSIS — Z882 Allergy status to sulfonamides status: Secondary | ICD-10-CM | POA: Insufficient documentation

## 2016-09-15 DIAGNOSIS — I639 Cerebral infarction, unspecified: Secondary | ICD-10-CM | POA: Diagnosis present

## 2016-09-15 DIAGNOSIS — I6350 Cerebral infarction due to unspecified occlusion or stenosis of unspecified cerebral artery: Secondary | ICD-10-CM | POA: Diagnosis not present

## 2016-09-15 DIAGNOSIS — F419 Anxiety disorder, unspecified: Secondary | ICD-10-CM | POA: Insufficient documentation

## 2016-09-15 DIAGNOSIS — E785 Hyperlipidemia, unspecified: Secondary | ICD-10-CM | POA: Diagnosis not present

## 2016-09-15 DIAGNOSIS — I739 Peripheral vascular disease, unspecified: Secondary | ICD-10-CM | POA: Diagnosis not present

## 2016-09-15 DIAGNOSIS — F329 Major depressive disorder, single episode, unspecified: Secondary | ICD-10-CM | POA: Insufficient documentation

## 2016-09-15 DIAGNOSIS — K219 Gastro-esophageal reflux disease without esophagitis: Secondary | ICD-10-CM | POA: Diagnosis not present

## 2016-09-15 DIAGNOSIS — Z9889 Other specified postprocedural states: Secondary | ICD-10-CM | POA: Diagnosis not present

## 2016-09-15 DIAGNOSIS — M549 Dorsalgia, unspecified: Secondary | ICD-10-CM

## 2016-09-15 HISTORY — DX: Other chronic pain: G89.29

## 2016-09-15 HISTORY — DX: Low back pain, unspecified: M54.50

## 2016-09-15 HISTORY — DX: Low back pain: M54.5

## 2016-09-15 LAB — URINALYSIS, COMPLETE (UACMP) WITH MICROSCOPIC
BILIRUBIN URINE: NEGATIVE
Bacteria, UA: NONE SEEN
GLUCOSE, UA: NEGATIVE mg/dL
HGB URINE DIPSTICK: NEGATIVE
Ketones, ur: NEGATIVE mg/dL
LEUKOCYTES UA: NEGATIVE
Nitrite: POSITIVE — AB
PH: 8 (ref 5.0–8.0)
PROTEIN: NEGATIVE mg/dL
Specific Gravity, Urine: 1.017 (ref 1.005–1.030)
WBC UA: NONE SEEN WBC/hpf (ref 0–5)

## 2016-09-15 LAB — COMPREHENSIVE METABOLIC PANEL
ALBUMIN: 3.6 g/dL (ref 3.5–5.0)
ALT: 20 U/L (ref 14–54)
ANION GAP: 8 (ref 5–15)
AST: 21 U/L (ref 15–41)
Alkaline Phosphatase: 87 U/L (ref 38–126)
BUN: 21 mg/dL — ABNORMAL HIGH (ref 6–20)
CHLORIDE: 99 mmol/L — AB (ref 101–111)
CO2: 30 mmol/L (ref 22–32)
Calcium: 9.2 mg/dL (ref 8.9–10.3)
Creatinine, Ser: 0.74 mg/dL (ref 0.44–1.00)
GFR calc Af Amer: >60 mL/min (ref >60)
Glucose, Bld: 162 mg/dL — ABNORMAL HIGH (ref 65–99)
Potassium: 3.5 mmol/L (ref 3.5–5.1)
Sodium: 137 mmol/L (ref 135–145)
TOTAL PROTEIN: 6.9 g/dL (ref 6.5–8.1)
Total Bilirubin: 0.4 mg/dL (ref 0.3–1.2)

## 2016-09-15 LAB — CBC WITH DIFFERENTIAL/PLATELET
BASOS ABS: 0.1 10*3/uL (ref 0–0.1)
BASOS PCT: 1 %
EOS PCT: 0 %
Eosinophils Absolute: 0 10*3/uL (ref 0–0.7)
HEMATOCRIT: 39.4 % (ref 35.0–47.0)
Hemoglobin: 13.4 g/dL (ref 12.0–16.0)
Lymphocytes Relative: 15 %
Lymphs Abs: 1.8 10*3/uL (ref 1.0–3.6)
MCH: 31.3 pg (ref 26.0–34.0)
MCHC: 34 g/dL (ref 32.0–36.0)
MCV: 91.9 fL (ref 80.0–100.0)
MONOS PCT: 3 %
Monocytes Absolute: 0.3 10*3/uL (ref 0.2–0.9)
Neutro Abs: 9.5 10*3/uL — ABNORMAL HIGH (ref 1.4–6.5)
Neutrophils Relative %: 81 %
PLATELETS: 388 10*3/uL (ref 150–440)
RBC: 4.28 MIL/uL (ref 3.80–5.20)
RDW: 13.4 % (ref 11.5–14.5)
WBC: 11.7 10*3/uL — ABNORMAL HIGH (ref 3.6–11.0)

## 2016-09-15 NOTE — ED Notes (Signed)
This RN spoke with Dr. Cyril Loosen regarding patient symptoms, received VORB for CT, and lab work at this time.

## 2016-09-15 NOTE — H&P (Signed)
History and Physical   [facility] [place] Admission History and Physical [employer], D.O.    Patient Name: Karen Chen MR#: [id number] Date of Birth: 02/10/1961 Date of Admission: 09/15/2016  Referring MD/NP/PA: Dr. Darnelle Catalan Primary Care Physician: Dr. Dayna Barker Patient coming from: Home  Chief Complaint: Weakness  HPI: Karen Chen is a 56 y.o. female with a known history of anxiety, hypertension, restless legs, rheumatoid arthritis was in a usual state of health until today when she reports the sudden onset of left leg weakness associated with numbness and a cold sensation in the left lower extremity. She also reports left upper extremity numbness. She states that she has been worked up through her primary care provider for radicular low back pain and was supposed to have an MRI of her low spine.   Otherwise there has been no change in status. Patient has been taking medication as prescribed and there has been no recent change in medication or diet.  No recent antibiotics.  There has been no recent illness, hospitalizations, travel or sick contacts.    Patient denies  facial droop, speech difficultyfevers/chills, weakness, dizziness, chest pain, shortness of breath, N/V/C/D, abdominal pain, dysuria/frequency, changes in mental status, bladder or bowel incontinence.    Review of Systems:  CONSTITUTIONAL: No fever/chills, fatigue, weakness, weight gain/loss, headache. EYES: No blurry or double vision. ENT: No tinnitus, postnasal drip, redness or soreness of the oropharynx. RESPIRATORY: No cough, dyspnea, wheeze.  No hemoptysis.  CARDIOVASCULAR: No chest pain, palpitations, syncope, orthopnea. No lower extremity edema.  GASTROINTESTINAL: No nausea, vomiting, abdominal pain, diarrhea, constipation.  No hematemesis, melena or hematochezia. GENITOURINARY: No dysuria, frequency, hematuria. ENDOCRINE: No polyuria or nocturia. No heat or cold intolerance. HEMATOLOGY: No  anemia, bruising, bleeding. INTEGUMENTARY: No rashes, ulcers, lesions. MUSCULOSKELETAL: No arthritis, gout, dyspnea. positive severe restless leg syndrome NEUROLOGIC:  Positive left sided weakness,numbness, tingling as described above.  ataxia, seizure-type activity. PSYCHIATRIC: No anxiety, depression, insomnia.   Past Medical History:  Diagnosis Date  . Anxiety   . Chronic lower back pain   . Hypertension   . Restless leg   . Rheumatoid arthritis [facility])     Past Surgical History:  Procedure Laterality Date  . BACK SURGERY  1980     reports that she has been smoking.  She has a 35.00 pack-year smoking history. She does not have any smokeless tobacco history on file. She reports that she does not drink alcohol or use drugs.  Allergies  Allergen Reactions  . Orencia [Abatacept] Anaphylaxis  . Sulfa Antibiotics Anaphylaxis  . Azathioprine Other (See Comments)  . Indocin [Indomethacin]   . Methotrexate Derivatives   . Remicade [Infliximab] Hives  . Adalimumab Itching and Rash    Family History  Problem Relation Age of Onset  . Heart failure Father   . Hypertension Father   . Diabetes Sister   Father died at 25 of unknown cause. Was an alcoholic  Family history has been reviewed and confirmed with patient.   Prior to Admission medications   Medication Sig Start Date End Date Taking? Authorizing Provider  ALPRAZolam Prudy Feeler) 0.5 MG tablet  04/29/16   Historical Provider, MD  aspirin 81 MG tablet Take 81 mg by mouth daily.    Historical Provider, MD  DULoxetine (CYMBALTA) 30 MG capsule  04/29/16   Historical Provider, MD  gabapentin (NEURONTIN) 300 MG capsule Take 300 mg by mouth 3 (three) times daily.  09/05/13   Historical Provider, MD  hydrochlorothiazide (HYDRODIURIL)  12.5 MG tablet Take 12.5 mg by mouth daily.    Historical Provider, MD  ibuprofen (ADVIL,MOTRIN) 200 MG tablet Take 200 mg by mouth every 6 (six) hours as needed.    Historical Provider, MD  lisinopril  (PRINIVIL,ZESTRIL) 10 MG tablet Take 10 mg by mouth daily.  09/13/13   Historical Provider, MD  POTASSIUM PO Take by mouth daily.    Historical Provider, MD  predniSONE (DELTASONE) 5 MG tablet Take 5 mg by mouth daily with breakfast.  09/13/13   Historical Provider, MD  rOPINIRole (REQUIP) 4 MG tablet Take 4 mg by mouth at bedtime.  09/05/13   Historical Provider, MD    Physical Exam: Vitals:   09/15/16 2109  BP: (!) 132/59  Pulse: 90  Resp: 18  Temp: 98 F (36.7 C)  TempSrc: Oral  SpO2: 99%  Weight: 68 kg (150 lb)  Height: 5\' 8"  (1.727 m)    GENERAL: 56 y.o.-year-old Chronically ill-appearing white female patient, well-developed, well-nourished lying in the bed in no acute distress.  Pleasant and cooperative.  Somewhat anxious with resting tremor that is worse with intentional movement HEENT: Head atraumatic, normocephalic. Pupils equal, round, reactive to light and accommodation. No scleral icterus. Extraocular muscles intact. Nares are patent. Oropharynx is clear. Mucus membranes moist. NECK: Supple, full range of motion. No JVD, no bruit heard. No thyroid enlargement, no tenderness, no cervical lymphadenopathy. CHEST: Normal breath sounds bilaterally. No wheezing, rales, rhonchi or crackles. No use of accessory muscles of respiration.  No reproducible chest wall tenderness.  CARDIOVASCULAR: S1, S2 normal. No murmurs, rubs, or gallops. Cap refill <2 seconds. Pulses intact distally.  ABDOMEN: Soft, nondistended, nontender. No rebound, guarding, rigidity. Normoactive bowel sounds present in all four quadrants. No organomegaly or mass. EXTREMITIES: No pedal edema, cyanosis, or clubbing. No calf tenderness or Homan's sign.  Ulnar deviation bilateral hands with hypertrophic joints. Left lower extremity is cool to the touch from the mid calf to the foot with chronic-appearing peripheral vascular changes including discoloration. She does have palpable dorsalis pedis and sensation is intact  bilaterally NEUROLOGIC: The patient is alert and oriented x 3. Cranial nerves II through XII are grossly intact with no focal sensorimotor deficit. Muscle strength 3/5 in  left upper and left lower extremities. 4 out of 5 in right upper and right lower extremities. Sensation intact. Gait not checked. PSYCHIATRIC:  Normal affect, mood, thought content. SKIN: Warm, dry, and intact without obvious rash, lesion, or ulcer.    Labs on Admission:  CBC:  Recent Labs Lab 09/15/16 2114  WBC 11.7*  NEUTROABS 9.5*  HGB 13.4  HCT 39.4  MCV 91.9  PLT 388   Basic Metabolic Panel:  Recent Labs Lab 09/15/16 2114  NA 137  K 3.5  CL 99*  CO2 30  GLUCOSE 162*  BUN 21*  CREATININE 0.74  CALCIUM 9.2   GFR: Estimated Creatinine Clearance: 80.2 mL/min (by C-G formula based on SCr of 0.74 mg/dL). Liver Function Tests:  Recent Labs Lab 09/15/16 2114  AST 21  ALT 20  ALKPHOS 87  BILITOT 0.4  PROT 6.9  ALBUMIN 3.6   No results for input(s): LIPASE, AMYLASE in the last 168 hours. No results for input(s): AMMONIA in the last 168 hours. Coagulation Profile: No results for input(s): INR, PROTIME in the last 168 hours. Cardiac Enzymes: No results for input(s): CKTOTAL, CKMB, CKMBINDEX, TROPONINI in the last 168 hours. BNP (last 3 results) No results for input(s): PROBNP in the last 8760 hours. HbA1C:  No results for input(s): HGBA1C in the last 72 hours. CBG: No results for input(s): GLUCAP in the last 168 hours. Lipid Profile: No results for input(s): CHOL, HDL, LDLCALC, TRIG, CHOLHDL, LDLDIRECT in the last 72 hours. Thyroid Function Tests: No results for input(s): TSH, T4TOTAL, FREET4, T3FREE, THYROIDAB in the last 72 hours. Anemia Panel: No results for input(s): VITAMINB12, FOLATE, FERRITIN, TIBC, IRON, RETICCTPCT in the last 72 hours. Urine analysis:    Component Value Date/Time   COLORURINE YELLOW (A) 09/15/2016 2114   APPEARANCEUR CLOUDY (A) 09/15/2016 2114   APPEARANCEUR  Clear 06/09/2013 0651   LABSPEC 1.017 09/15/2016 2114   LABSPEC 1.012 06/09/2013 0651   PHURINE 8.0 09/15/2016 2114   GLUCOSEU NEGATIVE 09/15/2016 2114   GLUCOSEU Negative 06/09/2013 0651   HGBUR NEGATIVE 09/15/2016 2114   BILIRUBINUR NEGATIVE 09/15/2016 2114   BILIRUBINUR Negative 06/09/2013 0651   KETONESUR NEGATIVE 09/15/2016 2114   PROTEINUR NEGATIVE 09/15/2016 2114   NITRITE POSITIVE (A) 09/15/2016 2114   LEUKOCYTESUR NEGATIVE 09/15/2016 2114   LEUKOCYTESUR Negative 06/09/2013 0651   Sepsis Labs: @LABRCNTIP (procalcitonin:4,lacticidven:4) )No results found for this or any previous visit (from the past 240 hour(s)).   Radiological Exams on Admission: Ct Head Wo Contrast  Result Date: 09/15/2016 CLINICAL DATA:  Left leg numbness.  Left-sided weakness. EXAM: CT HEAD WITHOUT CONTRAST TECHNIQUE: Contiguous axial images were obtained from the base of the skull through the vertex without intravenous contrast. COMPARISON:  Head CT 06/15/2016 FINDINGS: Brain: No evidence of acute infarction, hemorrhage, hydrocephalus, extra-axial collection or mass lesion/mass effect. Vascular: No hyperdense vessel or unexpected calcification. Skull: Normal. Negative for fracture or focal lesion. Sinuses/Orbits: Paranasal sinuses and mastoid air cells are clear. The visualized orbits are unremarkable. Other: None. IMPRESSION: No acute intracranial abnormality. Electronically Signed   By: 06/17/2016 M.D.   On: 09/15/2016 21:49    EKG: Normal sinus rhythm at 84 bpm with normal axis, LAE and nonspecific ST-T wave changes.   Assessment/Plan  This is a 56 y.o. female with a history of anxiety, RLS, LBP, HTN, RA now being admitted with: 1. Left sided weakness, rule out TIA/CVA - Admit telemetry observation for neuro workup including: - Studies: MRA/MRI, Echo, Carotids - Labs: CBC, BMP, Lipids, TFTs, A1C - Nursing: Neurochecks, O2, dysphagia screen, permissive hypertension.  - Consults: Neurology,  PT/OT, S/S consults.  - Meds: Daily aspirin 81mg .   - Fluids: IVNS@75cc /hr.   - Routine DVT Px: with Lovenox, SCDs, early ambulation  2. Peripheral vascular disease -Check arterial and venous Doppler bilateral lower extremities -Vascular surgery consultation has been requested  -Continue gabapentin for peripheral neuropathy  3. Hypertension -Continue hydrochlorothiazide, lisinopril   4. Anxiety -Continue Xanax, Zoloft  5. History of restless legs  -continue Requip  6. History of GERD -Continue Protonix in place of Prilosec  7. History of rheumatoid arthritis -Continue prednisone   Admission status: Observation IV Fluids: NS Diet/Nutrition: NPO Consults called: Neuro, Vascular  DVT Px: Lovenox, SCDs and early ambulation. Code Status: Full Code  Disposition Plan: To home in <24 hours   All the records are reviewed and case discussed with ED provider. Management plans discussed with the patient and/or family who express understanding and agree with plan of care.  Andreia Gandolfi D.O. on 09/15/2016 at 11:05 PM Between 7am to 6pm - Pager - [phone] After 6pm go to [email or web address] - 09/17/2016 [place] [facility] [phone] CC: Primary care physician; No primary care provider on file.   09/15/2016, 11:05 PM

## 2016-09-15 NOTE — ED Triage Notes (Signed)
Pt c/o lower back pain and L leg numbness. Pt states L leg numbness started today but has chronic lower back issues. Pt states saw Dr. Gavin Potters on Monday and was noted to have L sided weakness and decreased reflexes to her L leg. Pt also c/o L arm numbness that started this morning at approx 11 am. Pt is A&Ox4 at this time. Grip strengths equal and weak bilaterally, bilateral equal leg strength at this time.

## 2016-09-16 ENCOUNTER — Observation Stay: Admit: 2016-09-16 | Payer: Medicare Other

## 2016-09-16 ENCOUNTER — Observation Stay: Payer: Medicare Other

## 2016-09-16 DIAGNOSIS — G2581 Restless legs syndrome: Secondary | ICD-10-CM | POA: Diagnosis not present

## 2016-09-16 DIAGNOSIS — G459 Transient cerebral ischemic attack, unspecified: Secondary | ICD-10-CM | POA: Diagnosis present

## 2016-09-16 DIAGNOSIS — I63233 Cerebral infarction due to unspecified occlusion or stenosis of bilateral carotid arteries: Secondary | ICD-10-CM | POA: Diagnosis not present

## 2016-09-16 DIAGNOSIS — R200 Anesthesia of skin: Secondary | ICD-10-CM | POA: Diagnosis not present

## 2016-09-16 DIAGNOSIS — I10 Essential (primary) hypertension: Secondary | ICD-10-CM | POA: Diagnosis not present

## 2016-09-16 DIAGNOSIS — R531 Weakness: Secondary | ICD-10-CM | POA: Diagnosis not present

## 2016-09-16 DIAGNOSIS — F411 Generalized anxiety disorder: Secondary | ICD-10-CM | POA: Diagnosis not present

## 2016-09-16 DIAGNOSIS — M069 Rheumatoid arthritis, unspecified: Secondary | ICD-10-CM | POA: Diagnosis not present

## 2016-09-16 LAB — CBC
HEMATOCRIT: 39 % (ref 35.0–47.0)
HEMOGLOBIN: 13.4 g/dL (ref 12.0–16.0)
MCH: 31.7 pg (ref 26.0–34.0)
MCHC: 34.4 g/dL (ref 32.0–36.0)
MCV: 92.3 fL (ref 80.0–100.0)
Platelets: 359 10*3/uL (ref 150–440)
RBC: 4.22 MIL/uL (ref 3.80–5.20)
RDW: 13 % (ref 11.5–14.5)
WBC: 10.6 10*3/uL (ref 3.6–11.0)

## 2016-09-16 LAB — LIPID PANEL
CHOL/HDL RATIO: 3.6 ratio
Cholesterol: 191 mg/dL (ref 0–200)
HDL: 53 mg/dL (ref >40)
LDL Cholesterol: 119 mg/dL — ABNORMAL HIGH (ref 0–99)
Triglycerides: 94 mg/dL (ref <150)
VLDL: 19 mg/dL (ref 0–40)

## 2016-09-16 LAB — TROPONIN I: Troponin I: <0.03 ng/mL (ref <0.03)

## 2016-09-16 LAB — URINE DRUG SCREEN, QUALITATIVE (ARMC ONLY)
AMPHETAMINES, UR SCREEN: NOT DETECTED
BARBITURATES, UR SCREEN: NOT DETECTED
BENZODIAZEPINE, UR SCRN: POSITIVE — AB
Cannabinoid 50 Ng, Ur ~~LOC~~: NOT DETECTED
Cocaine Metabolite,Ur ~~LOC~~: NOT DETECTED
MDMA (Ecstasy)Ur Screen: NOT DETECTED
METHADONE SCREEN, URINE: NOT DETECTED
OPIATE, UR SCREEN: NOT DETECTED
Phencyclidine (PCP) Ur S: NOT DETECTED
TRICYCLIC, UR SCREEN: NOT DETECTED

## 2016-09-16 LAB — URINALYSIS, DIPSTICK ONLY
BILIRUBIN URINE: NEGATIVE
Glucose, UA: NEGATIVE mg/dL
HGB URINE DIPSTICK: NEGATIVE
Ketones, ur: NEGATIVE mg/dL
Leukocytes, UA: NEGATIVE
Nitrite: POSITIVE — AB
PH: 6 (ref 5.0–8.0)
Protein, ur: NEGATIVE mg/dL
SPECIFIC GRAVITY, URINE: 1.017 (ref 1.005–1.030)

## 2016-09-16 LAB — GLUCOSE, CAPILLARY: Glucose-Capillary: 170 mg/dL — ABNORMAL HIGH (ref 65–99)

## 2016-09-16 LAB — TSH: TSH: 1.757 u[IU]/mL (ref 0.350–4.500)

## 2016-09-16 LAB — CREATININE, SERUM
CREATININE: 0.67 mg/dL (ref 0.44–1.00)
GFR calc Af Amer: >60 mL/min (ref >60)
GFR calc non Af Amer: >60 mL/min (ref >60)

## 2016-09-16 MED ORDER — ATORVASTATIN CALCIUM 20 MG PO TABS
40.0000 mg | ORAL_TABLET | Freq: Every day | ORAL | Status: DC
  Filled 2016-09-16: qty 2

## 2016-09-16 MED ORDER — STROKE: EARLY STAGES OF RECOVERY BOOK
Freq: Once | Status: AC
  Administered 2016-09-16: 12:00:00

## 2016-09-16 MED ORDER — SENNOSIDES-DOCUSATE SODIUM 8.6-50 MG PO TABS: 1.0000 | ORAL_TABLET | Freq: Every evening | ORAL | Status: DC | PRN

## 2016-09-16 MED ORDER — ALPRAZOLAM 0.5 MG PO TABS
0.5000 mg | ORAL_TABLET | Freq: Every day | ORAL | Status: DC
  Administered 2016-09-16: 0.5 mg via ORAL
  Filled 2016-09-16: qty 1

## 2016-09-16 MED ORDER — ROPINIROLE HCL 1 MG PO TABS
4.0000 mg | ORAL_TABLET | Freq: Every day | ORAL | Status: DC
  Administered 2016-09-16: 4 mg via ORAL
  Filled 2016-09-16: qty 4

## 2016-09-16 MED ORDER — ASPIRIN 81 MG PO CHEW
324.0000 mg | CHEWABLE_TABLET | Freq: Once | ORAL | Status: AC
  Administered 2016-09-16: 324 mg via ORAL
  Filled 2016-09-16: qty 4

## 2016-09-16 MED ORDER — GABAPENTIN 300 MG PO CAPS
300.0000 mg | ORAL_CAPSULE | Freq: Every day | ORAL | Status: DC
  Administered 2016-09-16: 300 mg via ORAL
  Filled 2016-09-16: qty 1

## 2016-09-16 MED ORDER — ACETAMINOPHEN 325 MG PO TABS: 650.0000 mg | ORAL_TABLET | ORAL | Status: DC | PRN

## 2016-09-16 MED ORDER — IBUPROFEN 800 MG PO TABS
800.0000 mg | ORAL_TABLET | Freq: Three times a day (TID) | ORAL | Status: DC | PRN
  Filled 2016-09-16: qty 1

## 2016-09-16 MED ORDER — SERTRALINE HCL 50 MG PO TABS
200.0000 mg | ORAL_TABLET | Freq: Every day | ORAL | Status: DC
  Administered 2016-09-16: 13:00:00 200 mg via ORAL
  Filled 2016-09-16: qty 4

## 2016-09-16 MED ORDER — CIPROFLOXACIN HCL 500 MG PO TABS
500.0000 mg | ORAL_TABLET | Freq: Two times a day (BID) | ORAL | Status: DC
  Filled 2016-09-16: qty 1

## 2016-09-16 MED ORDER — ASPIRIN EC 81 MG PO TBEC
81.0000 mg | DELAYED_RELEASE_TABLET | Freq: Every day | ORAL | Status: DC
  Administered 2016-09-16: 81 mg via ORAL
  Filled 2016-09-16: qty 1

## 2016-09-16 MED ORDER — LORAZEPAM 2 MG/ML IJ SOLN
1.0000 mg | Freq: Once | INTRAMUSCULAR | Status: AC
  Administered 2016-09-16: 15:00:00 1 mg via INTRAVENOUS
  Filled 2016-09-16: qty 1

## 2016-09-16 MED ORDER — POTASSIUM CHLORIDE CRYS ER 10 MEQ PO TBCR
10.0000 meq | EXTENDED_RELEASE_TABLET | Freq: Every day | ORAL | Status: DC
  Administered 2016-09-16: 13:00:00 10 meq via ORAL
  Filled 2016-09-16 (×2): qty 1

## 2016-09-16 MED ORDER — ASPIRIN EC 81 MG PO TBEC: 81.0000 mg | DELAYED_RELEASE_TABLET | Freq: Every day | ORAL | Status: DC

## 2016-09-16 MED ORDER — LORAZEPAM 2 MG/ML IJ SOLN
1.0000 mg | Freq: Once | INTRAMUSCULAR | Status: AC
  Administered 2016-09-16: 1 mg via INTRAVENOUS
  Filled 2016-09-16: qty 1

## 2016-09-16 MED ORDER — LISINOPRIL 10 MG PO TABS
10.0000 mg | ORAL_TABLET | Freq: Every day | ORAL | Status: DC
  Administered 2016-09-16: 10 mg via ORAL
  Filled 2016-09-16: qty 1

## 2016-09-16 MED ORDER — HYDROCHLOROTHIAZIDE 25 MG PO TABS
25.0000 mg | ORAL_TABLET | Freq: Every day | ORAL | Status: DC
  Administered 2016-09-16: 25 mg via ORAL
  Filled 2016-09-16: qty 1

## 2016-09-16 MED ORDER — ENOXAPARIN SODIUM 40 MG/0.4ML ~~LOC~~ SOLN
40.0000 mg | SUBCUTANEOUS | Status: DC
  Administered 2016-09-16: 40 mg via SUBCUTANEOUS
  Filled 2016-09-16: qty 0.4

## 2016-09-16 MED ORDER — ACETAMINOPHEN 650 MG RE SUPP: 650.0000 mg | RECTAL | Status: DC | PRN

## 2016-09-16 MED ORDER — CIPROFLOXACIN HCL 500 MG PO TABS: 500.0000 mg | ORAL_TABLET | Freq: Two times a day (BID) | ORAL | 0 refills | Status: DC

## 2016-09-16 MED ORDER — ACETAMINOPHEN 160 MG/5ML PO SOLN
650.0000 mg | ORAL | Status: DC | PRN
  Filled 2016-09-16: qty 20.3

## 2016-09-16 MED ORDER — SODIUM CHLORIDE 0.9 % IV SOLN
INTRAVENOUS | Status: DC
  Administered 2016-09-16: 04:00:00 via INTRAVENOUS

## 2016-09-16 MED ORDER — PREDNISONE 5 MG PO TABS
10.0000 mg | ORAL_TABLET | Freq: Every day | ORAL | Status: DC
  Administered 2016-09-16: 10 mg via ORAL
  Filled 2016-09-16: qty 2

## 2016-09-16 MED ORDER — PANTOPRAZOLE SODIUM 40 MG PO TBEC
40.0000 mg | DELAYED_RELEASE_TABLET | Freq: Every day | ORAL | Status: DC
  Administered 2016-09-16: 13:00:00 40 mg via ORAL
  Filled 2016-09-16: qty 1

## 2016-09-16 NOTE — Progress Notes (Signed)
OT Cancellation Note  Patient Details Name: Karen Chen MRN: [id number] DOB: 02/14/1961   Cancelled Treatment:    Reason Eval/Treat Not Completed: OT screened, no needs identified, will sign off.  Order received and chart reviewed. Attempted twice to see patient earlier today and was at testing.  Met with patient, her husband and her sister.  Discussed options for ADLs to accommodate for arthritis in hands and given an adaptive equipment catalog but only screening completed, not a full OT evaluation, since she is able to complete all other areas of self care.  Thank you for the referral.  Chrys Racer, OTR/L ascom [phone] 09/16/16, 4:56 PM

## 2016-09-16 NOTE — Progress Notes (Signed)
SLP Cancellation Note  Patient Details Name: Karen Chen MRN: [id number] DOB: [birth year]   Cancelled treatment:       Reason Eval/Treat Not Completed: SLP screened, no needs identified, will sign off (reviewed chart and consulted NSG, then pt/family). Pt denied any difficulty swallowing at meals but indicated inconsistent difficulty swallowing pills - "some are just hard to get down but I know it's in my head. I've tried everything.". Discussed briefly the handout on tips for swallowing pills(provided) and offered the option of crushing the pills and mixing in ice cream, pudding, etc. Instructed pt to FIRST confirm w/ NSG or Pharmacist if her pills can be crushed. Pt and family agreed. No further skilled ST services indicated. NSG to reconsult if any change in status while admitted. Pt d/t discharge home today.   Jerilynn Som, MS, CCC-SLP [name],[name] 09/16/2016, 4:40 PM

## 2016-09-16 NOTE — Consult Note (Signed)
Reason for Consult:L sided weakness  Referring Physician: Dr. Imogene Burn   CC: L sided weakness   HPI: Karen Chen is an 56 y.o. female  known history of anxiety, hypertension, restless legs, rheumatoid arthritis was in a usual state of health until today when she reports the sudden onset of left leg weakness associated with numbness and a cold sensation in the left lower extremity. She also reports left upper extremity numbness. She states that she has been worked up through her primary care provider for radicular low back pain and was supposed to have an MRI of her low spine.     Past Medical History:  Diagnosis Date  . Anxiety   . Chronic lower back pain   . Hypertension   . Restless leg   . Rheumatoid arthritis [facility])     Past Surgical History:  Procedure Laterality Date  . BACK SURGERY  1980    Family History  Problem Relation Age of Onset  . Heart failure Father   . Hypertension Father   . Diabetes Sister     Social History:  reports that she has been smoking.  She has a 35.00 pack-year smoking history. She does not have any smokeless tobacco history on file. She reports that she does not drink alcohol or use drugs.  Allergies  Allergen Reactions  . Orencia [Abatacept] Anaphylaxis  . Sulfa Antibiotics Anaphylaxis  . Azathioprine Other (See Comments)  . Indocin [Indomethacin]   . Methotrexate Derivatives   . Remicade [Infliximab] Hives  . Adalimumab Itching and Rash    Medications: I have reviewed the patient's current medications.  ROS: Significant for anxiety/depression   Physical Examination: Blood pressure (!) 113/59, pulse 72, temperature 97.6 F (36.4 C), temperature source Oral, resp. rate 18, height 5\' 8"  (1.727 m), weight 66 kg (145 lb 9.6 oz), SpO2 100 %.   Neurological Examination Mental Status: Alert, oriented, thought content appropriate.  Speech fluent without evidence of aphasia.  Able to follow 3 step commands without difficulty. Cranial  Nerves: II: Discs flat bilaterally; Visual fields grossly normal, pupils equal, round, reactive to light and accommodation III,IV, VI: ptosis not present, extra-ocular motions intact bilaterally V,VII: smile symmetric, facial light touch sensation normal bilaterally VIII: hearing normal bilaterally IX,X: gag reflex present XI: bilateral shoulder shrug XII: midline tongue extension Motor: Right : Upper extremity   5/5    Left:     Upper extremity   5/5  Lower extremity   5/5     Lower extremity   5/5 Tone and bulk:normal tone throughout; no atrophy noted Sensory: Pinprick and light touch intact throughout, bilaterally Deep Tendon Reflexes: 2+ and symmetric throughout Plantars: Right: downgoing   Left: downgoing Cerebellar: normal finger-to-nose, normal rapid alternating movements and normal heel-to-shin test Gait: normal gait and station      Laboratory Studies:   Basic Metabolic Panel:  Recent Labs Lab 09/15/16 2114 09/16/16 0356  NA 137  --   K 3.5  --   CL 99*  --   CO2 30  --   GLUCOSE 162*  --   BUN 21*  --   CREATININE 0.74 0.67  CALCIUM 9.2  --     Liver Function Tests:  Recent Labs Lab 09/15/16 2114  AST 21  ALT 20  ALKPHOS 87  BILITOT 0.4  PROT 6.9  ALBUMIN 3.6   No results for input(s): LIPASE, AMYLASE in the last 168 hours. No results for input(s): AMMONIA in the last 168 hours.  CBC:  Recent Labs Lab 09/15/16 2114 09/16/16 0356  WBC 11.7* 10.6  NEUTROABS 9.5*  --   HGB 13.4 13.4  HCT 39.4 39.0  MCV 91.9 92.3  PLT 388 359    Cardiac Enzymes:  Recent Labs Lab 09/16/16 0356 09/16/16 1152  TROPONINI <0.03 <0.03    BNP: Invalid input(s): POCBNP  CBG: No results for input(s): GLUCAP in the last 168 hours.  Microbiology: No results found for this or any previous visit.  Coagulation Studies: No results for input(s): LABPROT, INR in the last 72 hours.  Urinalysis:  Recent Labs Lab 09/15/16 2114 09/16/16 1144  COLORURINE  YELLOW* YELLOW*  LABSPEC 1.017 1.017  PHURINE 8.0 6.0  GLUCOSEU NEGATIVE NEGATIVE  HGBUR NEGATIVE NEGATIVE  BILIRUBINUR NEGATIVE NEGATIVE  KETONESUR NEGATIVE NEGATIVE  PROTEINUR NEGATIVE NEGATIVE  NITRITE POSITIVE* POSITIVE*  LEUKOCYTESUR NEGATIVE NEGATIVE    Lipid Panel:     Component Value Date/Time   CHOL 191 09/16/2016 0356   TRIG 94 09/16/2016 0356   HDL 53 09/16/2016 0356   CHOLHDL 3.6 09/16/2016 0356   VLDL 19 09/16/2016 0356   LDLCALC 119 (H) 09/16/2016 0356    HgbA1C: No results found for: HGBA1C  Urine Drug Screen:     Component Value Date/Time   LABOPIA NONE DETECTED 09/16/2016 1143   COCAINSCRNUR NONE DETECTED 09/16/2016 1143   LABBENZ POSITIVE (A) 09/16/2016 1143   AMPHETMU NONE DETECTED 09/16/2016 1143   THCU NONE DETECTED 09/16/2016 1143   LABBARB NONE DETECTED 09/16/2016 1143    Alcohol Level: No results for input(s): ETH in the last 168 hours.  Other results: EKG: normal EKG, normal sinus rhythm, unchanged from previous tracings.  Imaging: Ct Head Wo Contrast  Result Date: 09/15/2016 CLINICAL DATA:  Left leg numbness.  Left-sided weakness. EXAM: CT HEAD WITHOUT CONTRAST TECHNIQUE: Contiguous axial images were obtained from the base of the skull through the vertex without intravenous contrast. COMPARISON:  Head CT 06/15/2016 FINDINGS: Brain: No evidence of acute infarction, hemorrhage, hydrocephalus, extra-axial collection or mass lesion/mass effect. Vascular: No hyperdense vessel or unexpected calcification. Skull: Normal. Negative for fracture or focal lesion. Sinuses/Orbits: Paranasal sinuses and mastoid air cells are clear. The visualized orbits are unremarkable. Other: None. IMPRESSION: No acute intracranial abnormality. Electronically Signed   By: Rubye Oaks M.D.   On: 09/15/2016 21:49   US Carotid Bilateral (at [facility] Only)  Result Date: 09/16/2016 CLINICAL DATA:  CVA. EXAM: BILATERAL CAROTID DUPLEX ULTRASOUND TECHNIQUE: Wallace Cullens scale  imaging, color Doppler and duplex ultrasound were performed of bilateral carotid and vertebral arteries in the neck. COMPARISON:  None. FINDINGS: Criteria: Quantification of carotid stenosis is based on velocity parameters that correlate the residual internal carotid diameter with NASCET-based stenosis levels, using the diameter of the distal internal carotid lumen as the denominator for stenosis measurement. The following velocity measurements were obtained: RIGHT ICA:  118 cm/sec CCA:  88 cm/sec SYSTOLIC ICA/CCA RATIO:  0.8 DIASTOLIC ICA/CCA RATIO:  0.9 ECA:  127 cm/sec LEFT ICA:  100 cm/sec CCA:  97 cm/sec SYSTOLIC ICA/CCA RATIO:  1.0 DIASTOLIC ICA/CCA RATIO:  0.9 ECA:  170 cm/sec RIGHT CAROTID ARTERY: Small amount of echogenic plaque at the right carotid bulb without significant stenosis. External carotid artery is patent with normal waveform. Small amount of echogenic plaque in the proximal internal carotid artery. Normal waveforms and velocities in the internal carotid artery. RIGHT VERTEBRAL ARTERY: Antegrade flow and normal waveform in the right vertebral artery. LEFT CAROTID ARTERY: Small amount of echogenic plaque at  the left carotid bulb. Echogenic plaque at the origin of the external carotid artery with mild stenosis. External carotid artery is patent with normal waveform. Small amount of plaque in the proximal internal carotid artery. Normal waveforms and velocities in the internal carotid artery. LEFT VERTEBRAL ARTERY: Antegrade flow and normal waveform in the left vertebral artery. IMPRESSION: Mild atherosclerotic disease in the carotid arteries. Estimated degree of stenosis in the internal carotid arteries is less than 50% bilaterally. Patent vertebral arteries. Electronically Signed   By: Richarda Overlie M.D.   On: 09/16/2016 11:56   [place] Lower Ext Art Bilat  Result Date: 09/16/2016 CLINICAL DATA:  Bilateral restless leg syndrome and peripheral vascular disease. History of hypertension and tobacco use.  EXAM: BILATERAL LOWER EXTREMITY ARTERIAL DUPLEX SCAN TECHNIQUE: Gray-scale sonography as well as color Doppler and duplex ultrasound was performed to evaluate the arteries of both lower extremities. COMPARISON:  None. FINDINGS: Right lower extremity: Normal common femoral artery without evidence of plaque or velocity elevation. Superficial femoral artery demonstrates biphasic waveform without evidence of significant focal plaque, stenosis or occlusion. Popliteal artery shows biphasic waveform without evidence of velocity elevation or focal plaque. Anterior tibial and posterior tibial arteries show normal velocities and biphasic waveforms. Left lower extremity: Common femoral artery demonstrates a mild amount of calcified plaque without evidence of velocity elevation. Waveform is biphasic. Superficial femoral artery demonstrates normal triphasic waveforms and velocities. Popliteal artery demonstrates no focal plaque or evidence of stenosis. Waveforms are biphasic. Anterior tibial and posterior tibial arteries show normal velocities and biphasic waveforms. IMPRESSION: No significant arterial occlusive disease identified by direct duplex interrogation of both lower extremities. Mild amount of plaque is present in the left common femoral artery without evidence of significant stenosis. Electronically Signed   By: Irish Lack M.D.   On: 09/16/2016 12:21     Assessment/Plan:  56 y.o. female  known history of anxiety, hypertension, restless legs, rheumatoid arthritis was in a usual state of health until today when she reports the sudden onset of left leg weakness associated with numbness and a cold sensation in the left lower extremity. She also reports left upper extremity numbness. She states that she has been worked up through her primary care provider for radicular low back pain and was supposed to have an MRI of her low spine.  - CTH no acute abnormality - Pt was not able to tolerate MRI due to restless  leg symptoms - Symptoms appear to be anxiety related and she is distract able and back to baseline - out pt follow up - no further imaging.   09/16/2016, 1:08 PM

## 2016-09-16 NOTE — Progress Notes (Signed)
OT Cancellation Note  Patient Details Name: Gustie Bobb MRN: [id number] DOB: Jun 18, 1961   Cancelled Treatment:    Reason Eval/Treat Not Completed: Patient at procedure or test/ unavailable.  Attempted evaluation again and pt's husband who was in the room indicated she was in MRI.  Will attempt again tomorrow.  Susanne Borders, OTR/L ascom [phone] 09/16/16, 1:43 PM

## 2016-09-16 NOTE — Discharge Summary (Addendum)
[facility] [place] [facility]   PATIENT NAME: Karen Chen    MR#:  [id number]  DATE OF BIRTH:  Jul 14, 1961  DATE OF ADMISSION:  09/15/2016   ADMITTING PHYSICIAN: Tonye Royalty, DO  DATE OF DISCHARGE: 09/16/2016 PRIMARY CARE PHYSICIAN: [facility]   ADMISSION DIAGNOSIS:  Peripheral vascular disease (HCC) [I73.9] CVA (cerebral vascular accident) (HCC) [I63.9] Cerebrovascular accident (CVA) due to occlusion of other cerebral artery (HCC) [I63.59] DISCHARGE DIAGNOSIS:  Active Problems:   CVA (cerebral vascular accident) (HCC)   TIA (transient ischemic attack) Left sided weakness, ruled out TIA/CVA UTI SECONDARY DIAGNOSIS:   Past Medical History:  Diagnosis Date  . Anxiety   . Chronic lower back pain   . Hypertension   . Restless leg   . Rheumatoid arthritis [facility])    HOSPITAL COURSE:  This is a 56 y.o. female with a history of anxiety, RLS, LBP, HTN, RA now being admitted with:  1. Left sided weakness, ruled out TIA/CVA No CVA per MRA/MRI, Echo is pending, Carotids: No stenosis. Symptoms appear to be anxiety related and she is distract able and back to baseline per neurologist. Continue aspirin. Hyperlipidemia. Follow-up with PCP.  2. peripheral neuropathy No significant arterial occlusive disease arterial Doppler bilateral lower extremities. -Continue gabapentin for peripheral neuropathy  3. Hypertension -Continue hydrochlorothiazide, lisinopril   4. Anxiety -Continue Xanax, Zoloft  5. History of restless legs  -continue Requip  6. History of GERD -Continue Protonix in place of Prilosec  7. History of rheumatoid arthritis -Continue prednisone, follow-up with Dr. Gavin Potters as outpatient.  UTI. Urinalysis show positive nitrate, start Cipro twice a day for 5 days. Lumbar spine stenosis, status post surgery. Per Dr. Gavin Potters, lumbar spine MRI today, but the patient could not get an MRI due to restless  leg.  Tobacco abuse. Smoking cessation was counseled for 3 minutes. DISCHARGE CONDITIONS:  Stable, discharge to home today. CONSULTS OBTAINED:  Treatment Team:  Pauletta Browns, MD Renford Dills, MD DRUG ALLERGIES:   Allergies  Allergen Reactions  . Orencia [Abatacept] Anaphylaxis  . Sulfa Antibiotics Anaphylaxis  . Azathioprine Other (See Comments)  . Indocin [Indomethacin]   . Methotrexate Derivatives   . Remicade [Infliximab] Hives  . Adalimumab Itching and Rash   DISCHARGE MEDICATIONS:   Allergies as of 09/16/2016      Reactions   Orencia [abatacept] Anaphylaxis   Sulfa Antibiotics Anaphylaxis   Azathioprine Other (See Comments)   Indocin [indomethacin]    Methotrexate Derivatives    Remicade [infliximab] Hives   Adalimumab Itching, Rash      Medication List    TAKE these medications   ALPRAZolam 0.5 MG tablet Commonly known as:  XANAX Take 0.5 mg by mouth at bedtime as needed.   aspirin 81 MG tablet Take 81 mg by mouth daily.   ciprofloxacin 500 MG tablet Commonly known as:  CIPRO Take 1 tablet (500 mg total) by mouth 2 (two) times daily.   gabapentin 300 MG capsule Commonly known as:  NEURONTIN Take 300 mg by mouth at bedtime.   hydrochlorothiazide 12.5 MG tablet Commonly known as:  HYDRODIURIL Take 25 mg by mouth daily.   hydrOXYzine 25 MG tablet Commonly known as:  ATARAX/VISTARIL Take 25 mg by mouth 3 (three) times daily as needed.   ibuprofen 200 MG tablet Commonly known as:  ADVIL,MOTRIN Take 800 mg by mouth every 8 (eight) hours as needed.   lisinopril 10 MG tablet Commonly known as:  PRINIVIL,ZESTRIL Take 10  mg by mouth daily.   omeprazole 20 MG capsule Commonly known as:  PRILOSEC Take 20 mg by mouth daily.   potassium chloride 10 MEQ tablet Commonly known as:  K-DUR Take 10 mEq by mouth daily.   predniSONE 5 MG tablet Commonly known as:  DELTASONE Take 10 mg by mouth daily with breakfast.   rOPINIRole 4 MG  tablet Commonly known as:  REQUIP Take 4 mg by mouth at bedtime.   sertraline 100 MG tablet Commonly known as:  ZOLOFT Take 200 mg by mouth daily.        DISCHARGE INSTRUCTIONS:  See AVS.  If you experience worsening of your admission symptoms, develop shortness of breath, life threatening emergency, suicidal or homicidal thoughts you must seek medical attention immediately by calling 911 or calling your MD immediately  if symptoms less severe.  You Must read complete instructions/literature along with all the possible adverse reactions/side effects for all the Medicines you take and that have been prescribed to you. Take any new Medicines after you have completely understood and accpet all the possible adverse reactions/side effects.   Please note  You were cared for by a hospitalist during your hospital stay. If you have any questions about your discharge medications or the care you received while you were in the hospital after you are discharged, you can call the unit and asked to speak with the hospitalist on call if the hospitalist that took care of you is not available. Once you are discharged, your primary care physician will handle any further medical issues. Please note that NO REFILLS for any discharge medications will be authorized once you are discharged, as it is imperative that you return to your primary care physician (or establish a relationship with a primary care physician if you do not have one) for your aftercare needs so that they can reassess your need for medications and monitor your lab values.    On the day of Discharge:  VITAL SIGNS:  Blood pressure 119/62, pulse 90, temperature 98.4 F (36.9 C), temperature source Oral, resp. rate 18, height 5\' 8"  (1.727 m), weight 145 lb 9.6 oz (66 kg), SpO2 91 %. PHYSICAL EXAMINATION:  GENERAL:  56 y.o.-year-old patient lying in the bed with no acute distress.  EYES: Pupils equal, round, reactive to light and accommodation.  No scleral icterus. Extraocular muscles intact.  HEENT: Head atraumatic, normocephalic. Oropharynx and nasopharynx clear.  NECK:  Supple, no jugular venous distention. No thyroid enlargement, no tenderness.  LUNGS: Normal breath sounds bilaterally, no wheezing, rales,rhonchi or crepitation. No use of accessory muscles of respiration.  CARDIOVASCULAR: S1, S2 normal. No murmurs, rubs, or gallops.  ABDOMEN: Soft, non-tender, non-distended. Bowel sounds present. No organomegaly or mass.  EXTREMITIES: No pedal edema, cyanosis, or clubbing.  NEUROLOGIC: Cranial nerves II through XII are intact. Muscle strength 5/5 in all extremities. Sensation intact. Gait not checked.  PSYCHIATRIC: The patient is alert and oriented x 3.  SKIN: No obvious rash, lesion, or ulcer.  DATA REVIEW:   CBC  Recent Labs Lab 09/16/16 0356  WBC 10.6  HGB 13.4  HCT 39.0  PLT 359    Chemistries   Recent Labs Lab 09/15/16 2114 09/16/16 0356  NA 137  --   K 3.5  --   CL 99*  --   CO2 30  --   GLUCOSE 162*  --   BUN 21*  --   CREATININE 0.74 0.67  CALCIUM 9.2  --   AST 21  --  ALT 20  --   ALKPHOS 87  --   BILITOT 0.4  --      Microbiology Results  No results found for this or any previous visit.  RADIOLOGY:  Ct Head Wo Contrast  Result Date: 09/15/2016 CLINICAL DATA:  Left leg numbness.  Left-sided weakness. EXAM: CT HEAD WITHOUT CONTRAST TECHNIQUE: Contiguous axial images were obtained from the base of the skull through the vertex without intravenous contrast. COMPARISON:  Head CT 06/15/2016 FINDINGS: Brain: No evidence of acute infarction, hemorrhage, hydrocephalus, extra-axial collection or mass lesion/mass effect. Vascular: No hyperdense vessel or unexpected calcification. Skull: Normal. Negative for fracture or focal lesion. Sinuses/Orbits: Paranasal sinuses and mastoid air cells are clear. The visualized orbits are unremarkable. Other: None. IMPRESSION: No acute intracranial abnormality.  Electronically Signed   By: Rubye Oaks M.D.   On: 09/15/2016 21:49   Mr Brain Wo Contrast  Result Date: 09/16/2016 CLINICAL DATA:  Sudden onset of left leg weakness. Left upper extremity numbness. EXAM: MRI HEAD WITHOUT CONTRAST MRA HEAD WITHOUT CONTRAST TECHNIQUE: Multiplanar, multiecho pulse sequences of the brain and surrounding structures were obtained without intravenous contrast. Angiographic images of the head were obtained using MRA technique without contrast. COMPARISON:  Head CT from yesterday FINDINGS: MRI HEAD FINDINGS Brain: No acute infarction, hemorrhage, hydrocephalus, extra-axial collection or mass lesion. 10-20 foci of FLAIR hyperintensity in the periventricular, deep, and juxta cortical cerebral white matter. Subtle patchy FLAIR hyperintensity in the pons. None of these areas restrict diffusion. Normal brain volume. Vascular: Arterial findings below. Normal dural venous sinus flow voids. Skull and upper cervical spine: Negative for marrow lesion. Sinuses/Orbits: Chronic right sphenoid sinusitis with sclerotic wall thickening and circumferential mucosal edema. MRA HEAD FINDINGS Symmetric carotid and vertebral arteries. Intact circle-of-Willis. Apparent proximal right M2 branch narrowing is not convincing on source images. No treatable stenosis or major branch occlusion. No vessel beading or aneurysm. IMPRESSION: 1. No acute finding, including infarct. 2. Nonspecific signal abnormalities in the cerebral white matter as seen with chronic microvascular disease or demyelination. 3. Negative intracranial MRA. 4. Chronic right sphenoid sinusitis. Electronically Signed   By: Marnee Spring M.D.   On: 09/16/2016 14:48   US Carotid Bilateral (at [facility] Only)  Result Date: 09/16/2016 CLINICAL DATA:  CVA. EXAM: BILATERAL CAROTID DUPLEX ULTRASOUND TECHNIQUE: Wallace Cullens scale imaging, color Doppler and duplex ultrasound were performed of bilateral carotid and vertebral arteries in the neck.  COMPARISON:  None. FINDINGS: Criteria: Quantification of carotid stenosis is based on velocity parameters that correlate the residual internal carotid diameter with NASCET-based stenosis levels, using the diameter of the distal internal carotid lumen as the denominator for stenosis measurement. The following velocity measurements were obtained: RIGHT ICA:  118 cm/sec CCA:  88 cm/sec SYSTOLIC ICA/CCA RATIO:  0.8 DIASTOLIC ICA/CCA RATIO:  0.9 ECA:  127 cm/sec LEFT ICA:  100 cm/sec CCA:  97 cm/sec SYSTOLIC ICA/CCA RATIO:  1.0 DIASTOLIC ICA/CCA RATIO:  0.9 ECA:  170 cm/sec RIGHT CAROTID ARTERY: Small amount of echogenic plaque at the right carotid bulb without significant stenosis. External carotid artery is patent with normal waveform. Small amount of echogenic plaque in the proximal internal carotid artery. Normal waveforms and velocities in the internal carotid artery. RIGHT VERTEBRAL ARTERY: Antegrade flow and normal waveform in the right vertebral artery. LEFT CAROTID ARTERY: Small amount of echogenic plaque at the left carotid bulb. Echogenic plaque at the origin of the external carotid artery with mild stenosis. External carotid artery is patent with normal waveform. Small  amount of plaque in the proximal internal carotid artery. Normal waveforms and velocities in the internal carotid artery. LEFT VERTEBRAL ARTERY: Antegrade flow and normal waveform in the left vertebral artery. IMPRESSION: Mild atherosclerotic disease in the carotid arteries. Estimated degree of stenosis in the internal carotid arteries is less than 50% bilaterally. Patent vertebral arteries. Electronically Signed   By: Richarda Overlie M.D.   On: 09/16/2016 11:56   [place] Lower Ext Art Bilat  Result Date: 09/16/2016 CLINICAL DATA:  Bilateral restless leg syndrome and peripheral vascular disease. History of hypertension and tobacco use. EXAM: BILATERAL LOWER EXTREMITY ARTERIAL DUPLEX SCAN TECHNIQUE: Gray-scale sonography as well as color Doppler and  duplex ultrasound was performed to evaluate the arteries of both lower extremities. COMPARISON:  None. FINDINGS: Right lower extremity: Normal common femoral artery without evidence of plaque or velocity elevation. Superficial femoral artery demonstrates biphasic waveform without evidence of significant focal plaque, stenosis or occlusion. Popliteal artery shows biphasic waveform without evidence of velocity elevation or focal plaque. Anterior tibial and posterior tibial arteries show normal velocities and biphasic waveforms. Left lower extremity: Common femoral artery demonstrates a mild amount of calcified plaque without evidence of velocity elevation. Waveform is biphasic. Superficial femoral artery demonstrates normal triphasic waveforms and velocities. Popliteal artery demonstrates no focal plaque or evidence of stenosis. Waveforms are biphasic. Anterior tibial and posterior tibial arteries show normal velocities and biphasic waveforms. IMPRESSION: No significant arterial occlusive disease identified by direct duplex interrogation of both lower extremities. Mild amount of plaque is present in the left common femoral artery without evidence of significant stenosis. Electronically Signed   By: Irish Lack M.D.   On: 09/16/2016 12:21   Mr Maxine Glenn Head/brain ZO Cm  Result Date: 09/16/2016 CLINICAL DATA:  Sudden onset of left leg weakness. Left upper extremity numbness. EXAM: MRI HEAD WITHOUT CONTRAST MRA HEAD WITHOUT CONTRAST TECHNIQUE: Multiplanar, multiecho pulse sequences of the brain and surrounding structures were obtained without intravenous contrast. Angiographic images of the head were obtained using MRA technique without contrast. COMPARISON:  Head CT from yesterday FINDINGS: MRI HEAD FINDINGS Brain: No acute infarction, hemorrhage, hydrocephalus, extra-axial collection or mass lesion. 10-20 foci of FLAIR hyperintensity in the periventricular, deep, and juxta cortical cerebral white matter. Subtle  patchy FLAIR hyperintensity in the pons. None of these areas restrict diffusion. Normal brain volume. Vascular: Arterial findings below. Normal dural venous sinus flow voids. Skull and upper cervical spine: Negative for marrow lesion. Sinuses/Orbits: Chronic right sphenoid sinusitis with sclerotic wall thickening and circumferential mucosal edema. MRA HEAD FINDINGS Symmetric carotid and vertebral arteries. Intact circle-of-Willis. Apparent proximal right M2 branch narrowing is not convincing on source images. No treatable stenosis or major branch occlusion. No vessel beading or aneurysm. IMPRESSION: 1. No acute finding, including infarct. 2. Nonspecific signal abnormalities in the cerebral white matter as seen with chronic microvascular disease or demyelination. 3. Negative intracranial MRA. 4. Chronic right sphenoid sinusitis. Electronically Signed   By: Marnee Spring M.D.   On: 09/16/2016 14:48     Management plans discussed with the patient, her husband and they are in agreement.  CODE STATUS:     Code Status Orders        Start     Ordered   09/16/16 0347  Full code  Continuous     09/16/16 0346    Code Status History    Date Active Date Inactive Code Status Order ID Comments User Context   This patient has a current code status but no historical  code status.      TOTAL TIME TAKING CARE OF THIS PATIENT: 38 minutes.    Shaune Pollack M.D on 09/16/2016 at 4:26 PM  Between 7am to 6pm - Pager - [phone]  After 6pm go to [email or web address] - Social research officer, government  [facility] [place] [facility]  Office  [phone]  CC: Primary care physician; [facility]   Note: This dictation was prepared with Dragon dictation along with smaller phrase technology. Any transcriptional errors that result from this process are unintentional.

## 2016-09-16 NOTE — Progress Notes (Signed)
PT Cancellation Note  Patient Details Name: Karen Chen MRN: [id number] DOB: 05-14-1961   Cancelled Treatment:    Reason Eval/Treat Not Completed: Patient at procedure or test/unavailable; Attempted PT eval in AM with pt out of room at procedure; attempted PT eval in PM with pt out of room at procedure.  Will attempt PT eval at a future date as appropriate.   Ovidio Hanger PT, DPT 09/16/16, 3:50 PM

## 2016-09-16 NOTE — Discharge Instructions (Signed)
Heart healthy diet. Fall precaution. Smoking cessation.

## 2016-09-16 NOTE — Progress Notes (Signed)
OT Cancellation Note  Patient Details Name: Karen Chen MRN: [id number] DOB: [birth year]   Cancelled Treatment:    Reason Eval/Treat Not Completed: Patient at procedure or test/ unavailable. Order received and chart reviewed.  Pt at testing and not available for OT evaluation.  Pt has orders for multiple tests but will attempt again later today or tomorrow.  Susanne Borders, OTR/L ascom [phone] 09/16/16, 10:51 AM

## 2016-09-16 NOTE — Progress Notes (Signed)
Pt has been discharged home. Discharge papers given and explained to pt. Pt verbalized understanding. Meds and f/u appointments reviewed with pt. RX given.  

## 2016-09-17 LAB — HEMOGLOBIN A1C
HEMOGLOBIN A1C: 6.7 % — AB (ref 4.8–5.6)
Mean Plasma Glucose: 146 mg/dL

## 2016-09-20 LAB — URINE CULTURE: Culture: >=100000 — AB

## 2016-09-26 NOTE — ED Provider Notes (Signed)
[facility] Emergency Department Provider Note   ____________________________________________   First MD Initiated Contact with Patient 09/15/16 2252     (approximate)  I have reviewed the triage vital signs and the nursing notes.   HISTORY  Chief Complaint Back Pain and Numbness    HPI Karen Chen is a 56 y.o. female patient had seen Dr. Rennie Plowman notable for previous Monday had left leg weakness and decreased reflexes. She also had some back pain and was scheduled to have an MRI of the back. Patient then developed left arm numbness and weakness as well and came to the emergency room. In the emergency room patient continued had left arm numbness and weakness. The left arm numbness and weakness was new leg weakness had not changed any. Patient had no other known problems no headache nausea vomiting etc.   Past Medical History:  Diagnosis Date  . Anxiety   . Chronic lower back pain   . Hypertension   . Restless leg   . Rheumatoid arthritis [facility])     Patient Active Problem List   Diagnosis Date Noted  . TIA (transient ischemic attack) 09/16/2016  . CVA (cerebral vascular accident) (HCC) 09/15/2016  . Rheumatoid arthritis involving multiple joints (HCC) 05/04/2016  . Restless leg 09/20/2013    Past Surgical History:  Procedure Laterality Date  . BACK SURGERY  1980    Prior to Admission medications   Medication Sig Start Date End Date Taking? Authorizing Provider  ALPRAZolam Prudy Feeler) 0.5 MG tablet Take 0.5 mg by mouth at bedtime as needed.  04/29/16  Yes Historical Provider, MD  aspirin 81 MG tablet Take 81 mg by mouth daily.   Yes Historical Provider, MD  gabapentin (NEURONTIN) 300 MG capsule Take 300 mg by mouth at bedtime.  09/05/13  Yes Historical Provider, MD  hydrochlorothiazide (HYDRODIURIL) 12.5 MG tablet Take 25 mg by mouth daily.    Yes Historical Provider, MD  hydrOXYzine (ATARAX/VISTARIL) 25 MG tablet Take 25 mg by mouth 3 (three)  times daily as needed.   Yes Historical Provider, MD  ibuprofen (ADVIL,MOTRIN) 200 MG tablet Take 800 mg by mouth every 8 (eight) hours as needed.    Yes Historical Provider, MD  lisinopril (PRINIVIL,ZESTRIL) 10 MG tablet Take 10 mg by mouth daily.  09/13/13  Yes Historical Provider, MD  omeprazole (PRILOSEC) 20 MG capsule Take 20 mg by mouth daily.   Yes Historical Provider, MD  potassium chloride (K-DUR) 10 MEQ tablet Take 10 mEq by mouth daily.   Yes Historical Provider, MD  predniSONE (DELTASONE) 5 MG tablet Take 10 mg by mouth daily with breakfast.  09/13/13  Yes Historical Provider, MD  rOPINIRole (REQUIP) 4 MG tablet Take 4 mg by mouth at bedtime.  09/05/13  Yes Historical Provider, MD  sertraline (ZOLOFT) 100 MG tablet Take 200 mg by mouth daily.    Yes Historical Provider, MD  ciprofloxacin (CIPRO) 500 MG tablet Take 1 tablet (500 mg total) by mouth 2 (two) times daily. 09/16/16   Shaune Pollack, MD    Allergies Orencia [abatacept]; Sulfa antibiotics; Azathioprine; Indocin [indomethacin]; Methotrexate derivatives; Remicade [infliximab]; and Adalimumab  Family History  Problem Relation Age of Onset  . Heart failure Father   . Hypertension Father   . Diabetes Sister     Social History Social History  Substance Use Topics  . Smoking status: Current Every Day Smoker    Packs/day: 1.00    Years: 35.00  . Smokeless tobacco: Never Used  .  Alcohol use No    Review of Systems Constitutional: No fever/chills Eyes: No visual changes. ENT: No sore throat. Cardiovascular: Denies chest pain. Respiratory: Denies shortness of breath. Gastrointestinal: No abdominal pain.  No nausea, no vomiting.  No diarrhea.  No constipation. Genitourinary: Negative for dysuria. Musculoskeletal: The history of present illness Skin: Negative for rash. Neurological:See history of present illness  10-point ROS otherwise negative.  ____________________________________________   PHYSICAL EXAM:  VITAL  SIGNS: ED Triage Vitals [09/15/16 2109]  Enc Vitals Group     BP (!) 132/59     Pulse Rate 90     Resp 18     Temp 98 F (36.7 C)     Temp Source Oral     SpO2 99 %     Weight 150 lb (68 kg)     Height 5\' 8"  (1.727 m)     Head Circumference      Peak Flow      Pain Score 0     Pain Loc      Pain Edu?      Excl. in GC?     Constitutional: Alert and oriented. Well appearing and in no acute distress. Eyes: Conjunctivae are normal. PERRL. EOMI. Head: Atraumatic. Nose: No congestion/rhinnorhea. Mouth/Throat: Mucous membranes are moist.  Oropharynx non-erythematous. Neck: No stridor.  Cardiovascular: Normal rate, regular rhythm. Grossly normal heart sounds.  Good peripheral circulation. Respiratory: Normal respiratory effort.  No retractions. Lungs CTAB. Gastrointestinal: Soft and nontender. No distention. No abdominal bruits. No CVA tenderness. Musculoskeletal: No lower extremity tenderness nor edema. Neurologic:  Normal speech and language.Cranial nerves II through XII are intact cerebellar finger to nose was intact motor strength was decreased on the left side both arm and the leg at all major muscle groups. The patient also complained of numbness on the left side especially in the arm and the leg. The numbness was all around the extremity. Skin:  Skin is warm, dry and intact. No rash noted. Psychiatric: Mood and affect are normal. Speech and behavior are normal.  ____________________________________________   LABS (all labs ordered are listed, but only abnormal results are displayed)  Labs Reviewed  URINE CULTURE - Abnormal; Notable for the following:       Result Value   Culture >=100,000 COLONIES/mL ESCHERICHIA COLI (*)    Organism ID, Bacteria ESCHERICHIA COLI (*)    All other components within normal limits  CBC WITH DIFFERENTIAL/PLATELET - Abnormal; Notable for the following:    WBC 11.7 (*)    Neutro Abs 9.5 (*)    All other components within normal limits    COMPREHENSIVE METABOLIC PANEL - Abnormal; Notable for the following:    Chloride 99 (*)    Glucose, Bld 162 (*)    BUN 21 (*)    All other components within normal limits  URINALYSIS, COMPLETE (UACMP) WITH MICROSCOPIC - Abnormal; Notable for the following:    Color, Urine YELLOW (*)    APPearance CLOUDY (*)    Nitrite POSITIVE (*)    Squamous Epithelial / LPF 0-5 (*)    All other components within normal limits  URINE DRUG SCREEN, QUALITATIVE ([facility] ONLY) - Abnormal; Notable for the following:    Benzodiazepine, Ur Scrn POSITIVE (*)    All other components within normal limits  HEMOGLOBIN A1C - Abnormal; Notable for the following:    Hgb A1c MFr Bld 6.7 (*)    All other components within normal limits  LIPID PANEL - Abnormal; Notable for the following:  LDL Cholesterol 119 (*)    All other components within normal limits  URINALYSIS, DIPSTICK ONLY - Abnormal; Notable for the following:    Color, Urine YELLOW (*)    APPearance HAZY (*)    Nitrite POSITIVE (*)    All other components within normal limits  GLUCOSE, CAPILLARY - Abnormal; Notable for the following:    Glucose-Capillary 170 (*)    All other components within normal limits  CBC  CREATININE, SERUM  TROPONIN I  TROPONIN I  TSH   ____________________________________________  EKG  EKG read and interpreted by me shows normal sinus rhythm rate of 84 normal axis no acute ST-T wave changes there is possible left atrial enlargement. ____________________________________________  RADIOLOGY  Study Result   CLINICAL DATA:  Left leg numbness.  Left-sided weakness.  EXAM: CT HEAD WITHOUT CONTRAST  TECHNIQUE: Contiguous axial images were obtained from the base of the skull through the vertex without intravenous contrast.  COMPARISON:  Head CT 06/15/2016  FINDINGS: Brain: No evidence of acute infarction, hemorrhage, hydrocephalus, extra-axial collection or mass lesion/mass effect.  Vascular: No hyperdense  vessel or unexpected calcification.  Skull: Normal. Negative for fracture or focal lesion.  Sinuses/Orbits: Paranasal sinuses and mastoid air cells are clear. The visualized orbits are unremarkable.  Other: None.  IMPRESSION: No acute intracranial abnormality.   Electronically Signed   By: Rubye Oaks M.D.   On: 09/15/2016 21:49     ____________________________________________   PROCEDURES  Procedure(s) performed:  Procedures  Critical Care performed:   ____________________________________________   INITIAL IMPRESSION / ASSESSMENT AND PLAN / ED COURSE  Pertinent labs & imaging results that were available during my care of the patient were reviewed by me and considered in my medical decision making (see chart for details).        ____________________________________________   FINAL CLINICAL IMPRESSION(S) / ED DIAGNOSES  Final diagnoses:  Cerebrovascular accident (CVA) due to occlusion of other cerebral artery (HCC)  Peripheral vascular disease (HCC)  CVA (cerebral vascular accident) (HCC)      NEW MEDICATIONS STARTED DURING THIS VISIT:  Discharge Medication List as of 09/16/2016  4:56 PM    START taking these medications   Details  ciprofloxacin (CIPRO) 500 MG tablet Take 1 tablet (500 mg total) by mouth 2 (two) times daily., Starting Fri 09/16/2016, Print         Note:  This document was prepared using Dragon voice recognition software and may include unintentional dictation errors.    Arnaldo Natal, MD 09/26/16 2056

## 2016-09-28 ENCOUNTER — Ambulatory Visit
Admission: RE | Admit: 2016-09-28 | Discharge: 2016-09-28 | Disposition: A | Discharge status: Home / Self Care | Payer: Medicare Other | Source: Ambulatory Visit | Attending: Rheumatology | Admitting: Rheumatology

## 2016-09-28 DIAGNOSIS — M5416 Radiculopathy, lumbar region: Secondary | ICD-10-CM | POA: Diagnosis present

## 2016-09-28 DIAGNOSIS — M48061 Spinal stenosis, lumbar region without neurogenic claudication: Secondary | ICD-10-CM | POA: Insufficient documentation

## 2016-09-28 DIAGNOSIS — Z9889 Other specified postprocedural states: Secondary | ICD-10-CM | POA: Insufficient documentation

## 2016-09-28 DIAGNOSIS — M5116 Intervertebral disc disorders with radiculopathy, lumbar region: Secondary | ICD-10-CM | POA: Diagnosis not present

## 2016-09-28 DIAGNOSIS — M545 Low back pain: Secondary | ICD-10-CM | POA: Diagnosis not present

## 2016-09-28 DIAGNOSIS — M0579 Rheumatoid arthritis with rheumatoid factor of multiple sites without organ or systems involvement: Secondary | ICD-10-CM | POA: Diagnosis present

## 2016-10-04 DIAGNOSIS — M069 Rheumatoid arthritis, unspecified: Secondary | ICD-10-CM | POA: Diagnosis not present

## 2016-10-04 DIAGNOSIS — N390 Urinary tract infection, site not specified: Secondary | ICD-10-CM | POA: Diagnosis not present

## 2016-10-04 DIAGNOSIS — M5117 Intervertebral disc disorders with radiculopathy, lumbosacral region: Secondary | ICD-10-CM | POA: Diagnosis not present

## 2016-10-04 DIAGNOSIS — I739 Peripheral vascular disease, unspecified: Secondary | ICD-10-CM | POA: Diagnosis not present

## 2016-10-04 DIAGNOSIS — I10 Essential (primary) hypertension: Secondary | ICD-10-CM | POA: Diagnosis not present

## 2016-10-06 DIAGNOSIS — M545 Low back pain: Secondary | ICD-10-CM | POA: Diagnosis not present

## 2016-10-06 DIAGNOSIS — G8929 Other chronic pain: Secondary | ICD-10-CM | POA: Diagnosis not present

## 2016-10-24 DIAGNOSIS — M545 Low back pain: Secondary | ICD-10-CM | POA: Diagnosis not present

## 2016-10-24 DIAGNOSIS — G8929 Other chronic pain: Secondary | ICD-10-CM | POA: Diagnosis not present

## 2016-10-24 DIAGNOSIS — M5416 Radiculopathy, lumbar region: Secondary | ICD-10-CM | POA: Diagnosis not present

## 2016-10-24 DIAGNOSIS — M0579 Rheumatoid arthritis with rheumatoid factor of multiple sites without organ or systems involvement: Secondary | ICD-10-CM | POA: Diagnosis not present

## 2016-11-16 DIAGNOSIS — M5126 Other intervertebral disc displacement, lumbar region: Secondary | ICD-10-CM | POA: Diagnosis not present

## 2016-11-16 DIAGNOSIS — M5416 Radiculopathy, lumbar region: Secondary | ICD-10-CM | POA: Diagnosis not present

## 2016-12-20 DIAGNOSIS — M5416 Radiculopathy, lumbar region: Secondary | ICD-10-CM | POA: Diagnosis not present

## 2016-12-20 DIAGNOSIS — M5126 Other intervertebral disc displacement, lumbar region: Secondary | ICD-10-CM | POA: Diagnosis not present

## 2016-12-21 DIAGNOSIS — J069 Acute upper respiratory infection, unspecified: Secondary | ICD-10-CM | POA: Diagnosis not present

## 2016-12-21 DIAGNOSIS — M069 Rheumatoid arthritis, unspecified: Secondary | ICD-10-CM | POA: Diagnosis not present

## 2016-12-21 DIAGNOSIS — R062 Wheezing: Secondary | ICD-10-CM | POA: Diagnosis not present

## 2016-12-21 DIAGNOSIS — I10 Essential (primary) hypertension: Secondary | ICD-10-CM | POA: Diagnosis not present

## 2017-01-02 ENCOUNTER — Encounter: Payer: Self-pay | Admitting: Emergency Medicine

## 2017-01-02 ENCOUNTER — Emergency Department: Payer: Medicare Other

## 2017-01-02 ENCOUNTER — Emergency Department
Admission: EM | Admit: 2017-01-02 | Discharge: 2017-01-02 | Disposition: A | Discharge status: Home / Self Care | Payer: Medicare Other | Attending: Emergency Medicine | Admitting: Emergency Medicine

## 2017-01-02 DIAGNOSIS — R52 Pain, unspecified: Secondary | ICD-10-CM

## 2017-01-02 DIAGNOSIS — Z79899 Other long term (current) drug therapy: Secondary | ICD-10-CM | POA: Diagnosis not present

## 2017-01-02 DIAGNOSIS — Z7982 Long term (current) use of aspirin: Secondary | ICD-10-CM | POA: Insufficient documentation

## 2017-01-02 DIAGNOSIS — F17210 Nicotine dependence, cigarettes, uncomplicated: Secondary | ICD-10-CM | POA: Insufficient documentation

## 2017-01-02 DIAGNOSIS — M25551 Pain in right hip: Secondary | ICD-10-CM | POA: Insufficient documentation

## 2017-01-02 DIAGNOSIS — I10 Essential (primary) hypertension: Secondary | ICD-10-CM | POA: Insufficient documentation

## 2017-01-02 MED ORDER — PREDNISONE 10 MG (21) PO TBPK: ORAL_TABLET | ORAL | 0 refills | Status: DC

## 2017-01-02 MED ORDER — KETOROLAC TROMETHAMINE 30 MG/ML IJ SOLN
30.0000 mg | Freq: Once | INTRAMUSCULAR | Status: AC
  Administered 2017-01-02: 30 mg via INTRAMUSCULAR
  Filled 2017-01-02: qty 1

## 2017-01-02 MED ORDER — LORAZEPAM 2 MG/ML IJ SOLN
1.0000 mg | Freq: Once | INTRAMUSCULAR | Status: AC
  Administered 2017-01-02: 1 mg via INTRAMUSCULAR
  Filled 2017-01-02: qty 1

## 2017-01-02 NOTE — Discharge Instructions (Signed)
Take medication as prescribed. Return to emergency department if symptoms worsen and follow-up with PCP as needed.   °

## 2017-01-02 NOTE — ED Triage Notes (Signed)
Pt reports crossing her legs yesterday and felt something "pop" in her right hip, reports weakness when walking now.

## 2017-01-02 NOTE — ED Provider Notes (Signed)
[facility] Emergency Department Provider Note ____________________________________________  Time seen: Approximately 3:04 PM  I have reviewed the triage vital signs and the nursing notes.   HISTORY  Chief Complaint Hip Pain   HPI Karen Chen is a 56 y.o. female presents with right hip pain she noted worsening after crossing her legs yesterday and feeling a "pop". She denies recent or remote falls. Pt has a long history of rheumatoid arthritis. She described the "popping" sensation along the superficial anterior aspect of the right hip. Patients states her right hip has become progressively more sore due to compensating her left sided back pain that is causing left leg pain. Patient reports increased pain with weight-bearing activities and unsteadiness of the right lower extremity. She denies radicular symptoms. Pt denies recent illness, fever, chills, headache, vision changes, chest pain or abdominal pain.   Past Medical History:  Diagnosis Date  . Anxiety   . Chronic lower back pain   . Hypertension   . Restless leg   . Rheumatoid arthritis [facility])     Patient Active Problem List   Diagnosis Date Noted  . TIA (transient ischemic attack) 09/16/2016  . CVA (cerebral vascular accident) (HCC) 09/15/2016  . Rheumatoid arthritis involving multiple joints (HCC) 05/04/2016  . Restless leg 09/20/2013    Past Surgical History:  Procedure Laterality Date  . BACK SURGERY  1980    Prior to Admission medications   Medication Sig Start Date End Date Taking? Authorizing Provider  ALPRAZolam Prudy Feeler) 0.5 MG tablet Take 0.5 mg by mouth at bedtime as needed.  04/29/16   [provider]  aspirin 81 MG tablet Take 81 mg by mouth daily.    [provider]  ciprofloxacin (CIPRO) 500 MG tablet Take 1 tablet (500 mg total) by mouth 2 (two) times daily. 09/16/16   Shaune Pollack, MD  gabapentin (NEURONTIN) 300 MG capsule Take 300 mg by mouth at bedtime.  09/05/13    [provider]  hydrochlorothiazide (HYDRODIURIL) 12.5 MG tablet Take 25 mg by mouth daily.     [provider]  hydrOXYzine (ATARAX/VISTARIL) 25 MG tablet Take 25 mg by mouth 3 (three) times daily as needed.    [provider]  ibuprofen (ADVIL,MOTRIN) 200 MG tablet Take 800 mg by mouth every 8 (eight) hours as needed.     [provider]  lisinopril (PRINIVIL,ZESTRIL) 10 MG tablet Take 10 mg by mouth daily.  09/13/13   [provider]  omeprazole (PRILOSEC) 20 MG capsule Take 20 mg by mouth daily.    [provider]  potassium chloride (K-DUR) 10 MEQ tablet Take 10 mEq by mouth daily.    [provider]  predniSONE (STERAPRED UNI-PAK 21 TAB) 10 MG (21) TBPK tablet Take 6 tablets on day 1. Take 5 tablets on day 2. Take 4 tablets on day 3. Take 3 tablets on day 4. Take 2 tablets on day 5. Take 1 tablets on day 6. 01/02/17   Bradlee Heitman M, PA-C  rOPINIRole (REQUIP) 4 MG tablet Take 4 mg by mouth at bedtime.  09/05/13   [provider]  sertraline (ZOLOFT) 100 MG tablet Take 200 mg by mouth daily.     [provider]    Allergies Orencia [abatacept]; Sulfa antibiotics; Azathioprine; Indocin [indomethacin]; Methotrexate derivatives; Remicade [infliximab]; and Adalimumab  Family History  Problem Relation Age of Onset  . Heart failure Father   . Hypertension Father   . Diabetes Sister  Social History Social History  Substance Use Topics  . Smoking status: Current Every Day Smoker    Packs/day: 1.00    Years: 35.00  . Smokeless tobacco: Never Used  . Alcohol use No    Review of Systems Constitutional: No recent illness. No fever/chills Eyes: No visual changes. Cardiovascular: Negative for chest pain. Respiratory: Negative shortness of breath. Gastrointestinal: Negative for abdominal pain Genitourinary: Negative for dysuria. Musculoskeletal: Left shoulder pain Skin: No rashes noted Neurological:  Negative for headaches.  ____________________________________________   PHYSICAL EXAM:  VITAL SIGNS: ED Triage Vitals  Enc Vitals Group     BP 01/02/17 1340 127/63     Pulse Rate 01/02/17 1339 78     Resp 01/02/17 1339 16     Temp 01/02/17 1339 98.2 F (36.8 C)     Temp Source 01/02/17 1339 Oral     SpO2 01/02/17 1339 99%     Weight 01/02/17 1339 150 lb (68 kg)     Height 01/02/17 1339 5\' 8"  (1.727 m)     Head Circumference --      Peak Flow --      Pain Score 01/02/17 1339 0     Pain Loc --      Pain Edu? --      Excl. in GC? --     Constitutional: Alert and oriented. Well appearing and in no acute distress. Eyes: Conjunctivae are normal. PERRL. Head: Atraumatic Mouth/Throat: Mucous membranes are moist.  Cardiovascular: Normal rate, regular rhythm. Grossly normal heart sounds.  Good peripheral circulation. Respiratory: Normal respiratory effort.  No retractions. Lungs CTA bilaterally.  Gastrointestinal: Soft and nontender. No distention. Musculoskeletal: Right hip pain. Increases with weight bearing. Right lower extremity strength limited by pain.  Neurologic:  Normal speech and language. No gross focal neurologic deficits are appreciated. No gait instability. No radicular symptoms. Skin: No rash noted. Skin warm and intact.  Psychiatric: Mood and affect are normal. ____________________________________________   LABS (all labs ordered are listed, but only abnormal results are displayed)  Labs Reviewed - No data to display ____________________________________________  EKG none ____________________________________________  RADIOLOGY Right Hip FINDINGS: There is no evidence of hip fracture or dislocation. There is no evidence of arthropathy or other focal bone abnormality.  IMPRESSION: Negative. ____________________________________________   PROCEDURES  Procedure(s) performed: no  Critical Care performed:  No  ____________________________________________   INITIAL IMPRESSION / ASSESSMENT AND PLAN / ED COURSE     Pertinent labs & imaging results that were available during my care of the patient were reviewed by me and considered in my medical decision making (see chart for details).  Patient presented with atraumatic right hip pain. Imaging unremarkable for fracture. Physical exam revealed painful ROM and tenderness along hip complex musculature, consistent with muscle strain. Patient pain management initiated during ED course. She will continue with Prednisone taper once discharged. Patient was advised to follow up with PCP or her Orthopedic doctor as needed . She was also advised to return to the emergency department for symptoms that change or worsen if unable to schedule an appointment.  ____________________________________________   FINAL CLINICAL IMPRESSION(S) / ED DIAGNOSES  Final diagnoses:  Pain  Right hip pain     Note:  This document was prepared using Dragon voice recognition software and may include unintentional dictation errors.   Arya Boxley, 03/04/17, PA-C 01/02/17 1720    03/04/17, MD 01/03/17 1447

## 2017-01-10 DIAGNOSIS — M5416 Radiculopathy, lumbar region: Secondary | ICD-10-CM | POA: Diagnosis not present

## 2017-01-10 DIAGNOSIS — M5126 Other intervertebral disc displacement, lumbar region: Secondary | ICD-10-CM | POA: Diagnosis not present

## 2017-01-17 ENCOUNTER — Telehealth: Payer: Self-pay | Admitting: *Deleted

## 2017-01-26 DIAGNOSIS — M150 Primary generalized (osteo)arthritis: Secondary | ICD-10-CM | POA: Diagnosis not present

## 2017-01-26 DIAGNOSIS — R251 Tremor, unspecified: Secondary | ICD-10-CM | POA: Diagnosis not present

## 2017-01-26 DIAGNOSIS — M0579 Rheumatoid arthritis with rheumatoid factor of multiple sites without organ or systems involvement: Secondary | ICD-10-CM | POA: Diagnosis not present

## 2017-01-26 DIAGNOSIS — M545 Low back pain: Secondary | ICD-10-CM | POA: Diagnosis not present

## 2017-01-26 DIAGNOSIS — I10 Essential (primary) hypertension: Secondary | ICD-10-CM | POA: Diagnosis not present

## 2017-01-26 DIAGNOSIS — M810 Age-related osteoporosis without current pathological fracture: Secondary | ICD-10-CM | POA: Diagnosis not present

## 2017-02-13 DIAGNOSIS — R251 Tremor, unspecified: Secondary | ICD-10-CM | POA: Diagnosis not present

## 2017-02-13 DIAGNOSIS — G2581 Restless legs syndrome: Secondary | ICD-10-CM | POA: Diagnosis not present

## 2017-02-13 DIAGNOSIS — I959 Hypotension, unspecified: Secondary | ICD-10-CM | POA: Diagnosis not present

## 2017-03-02 ENCOUNTER — Ambulatory Visit: Payer: Medicare Other | Admitting: Pain Medicine

## 2017-03-06 NOTE — Progress Notes (Signed)
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                 Electrolytes Lab Results  Component Value Date   NA 144 03/07/2017   K 3.5 03/07/2017   CL 100 03/07/2017   CALCIUM 9.9 03/07/2017   MG 2.0 03/07/2017                 Neuropathy Markers Lab Results  Component Value Date   VITAMINB12 607 03/07/2017                 Bone Pathology Markers Lab Results  Component Value Date   ALKPHOS 98 03/07/2017   25OHVITD1 WILL FOLLOW 03/07/2017   25OHVITD2 WILL FOLLOW 03/07/2017   25OHVITD3 WILL FOLLOW 03/07/2017   CALCIUM 9.9 03/07/2017                 Coagulation Parameters Lab Results  Component Value Date   PLT 359 09/16/2016                 Cardiovascular Markers Lab Results  Component Value Date   HGB 13.4 09/16/2016   HCT 39.0 09/16/2016                 Note: Lab results reviewed.  [place]  Drug: Karen Chen  reports that she does not use drugs. Alcohol:  reports that she does not drink  alcohol. Tobacco:  reports that she has been smoking.  She has a 35.00 pack-year smoking history. She has never used smokeless tobacco. Medical:  has a past medical history of Anxiety; Chronic lower back pain; Hypertension; Restless leg; and Rheumatoid arthritis ([place]). Family: family history includes Diabetes in her sister; Heart failure in her father; Hypertension in her father.  Past Surgical History:  Procedure Laterality Date  . BACK SURGERY  1980   Active Ambulatory Problems    Diagnosis Date Noted  .  Restless leg 09/20/2013  . Rheumatoid arthritis involving multiple sites with positive rheumatoid factor ([place]) 05/04/2016  . CVA (cerebral vascular accident) ([place]) 09/15/2016  . TIA (transient ischemic attack) 09/16/2016  . ADHD (attention deficit hyperactivity disorder) 03/25/2015  . Anxiety 03/26/2014  . Bite from insect 03/25/2015  . Hypertension 03/26/2014  . PTSD (post-traumatic stress disorder) 03/25/2015  . Chronic pain syndrome 03/07/2017  . Long-term use of high-risk medication 03/07/2017  . Long term prescription benzodiazepine use 03/07/2017  . Chronic low back pain (Location of Primary Source of Pain) (Bilateral) (L>R) 03/07/2017  . Chronic lower extremity pain (Location of Secondary source of pain) (Bilateral) (L>R) 03/07/2017  . Chronic hand pain (Location of Tertiary source of pain) (Bilateral) (R>L) 03/07/2017  . Chronic neck pain (Posterior) (Bilateral) (R>L) 03/07/2017  . Chronic shoulder pain (Bilateral) (R>L) 03/07/2017  . Chronic foot pain (Bilateral) (R>L) 03/07/2017  . Failed back surgical syndrome 03/09/2017  . Grade II Anterolisthesis of L4 over L5 03/09/2017  . Epidural fibrosis 03/09/2017  . Chronic L4 bilateral pars defect 03/09/2017  . L2-3 and L3-4 intervertebral disc extrusion 03/09/2017  . Abnormal MRI, lumbar spine (09/28/2016) 03/09/2017  . Lumbar facet hypertrophy 03/09/2017  . Lumbar facet syndrome (Bilateral) (L>R) 03/09/2017  . Lumbar L3-4  lateral recess stenosis (Bilateral) (L>R) 03/09/2017   Resolved Ambulatory Problems    Diagnosis Date Noted  . No Resolved Ambulatory Problems   Past Medical History:  Diagnosis Date  . Anxiety   . Chronic lower back pain   . Hypertension   . Restless leg   . Rheumatoid arthritis ([place])    Constitutional Exam  General appearance: Well nourished, well developed, and well hydrated. In no apparent acute distress Vitals:   03/07/17 1054  BP: 119/85  Pulse: 72  Resp: 16  Temp: 98.9 F (37.2 C)  TempSrc: Oral  SpO2: 99%  Weight: 145 lb (65.8 kg)  Height: _0  (1.727 m)   BMI Assessment: Estimated body mass index is 22.05 kg/m as calculated from the following:   Height as of this encounter: _1  (1.727 m).   Weight as of this encounter: 145 lb (65.8 kg).  BMI interpretation table: BMI level Category Range association with higher incidence of chronic pain  <18 kg/m2 Underweight   18.5-24.9 kg/m2 Ideal body weight   25-29.9 kg/m2 Overweight Increased incidence by 20%  30-34.9 kg/m2 Obese (Class I) Increased incidence by 68%  35-39.9 kg/m2 Severe obesity (Class II) Increased incidence by 136%  >40 kg/m2 Extreme obesity (Class III) Increased incidence by 254%   BMI Readings from Last 4 Encounters:  03/07/17 22.05 kg/m  01/02/17 22.81 kg/m  09/16/16 22.14 kg/m  05/04/16 22.49 kg/m   Wt Readings from Last 4 Encounters:  03/07/17 145 lb (65.8 kg)  01/02/17 150 lb (68 kg)  09/16/16 145 lb 9.6 oz (66 kg)  05/04/16 147 lb 14.9 oz (67.1 kg)  Psych/Mental status: Alert, oriented x 3 (person, place, & time)       Eyes: PERLA Respiratory: No evidence of acute respiratory distress  Cervical Spine Exam  Inspection: No masses, redness, or swelling Alignment: Symmetrical Functional ROM: Unrestricted ROM      Stability: No instability detected Muscle strength & Tone: Functionally intact Sensory: Unimpaired Palpation: No palpable anomalies              Upper Extremity (UE)  Exam    Side: Right upper extremity  Side: Left upper extremity  Inspection: No masses, redness, swelling, or asymmetry. No contractures  Inspection: No masses, redness, swelling, or asymmetry. No contractures  Functional ROM: Unrestricted ROM          Functional ROM: Unrestricted ROM          Muscle strength & Tone: Functionally intact  Muscle strength & Tone: Functionally intact  Sensory: Unimpaired  Sensory: Unimpaired  Palpation: No palpable anomalies              Palpation: No palpable anomalies              Specialized Test(s): Deferred         Specialized Test(s): Deferred          Thoracic Spine Exam  Inspection: No masses, redness, or swelling Alignment: Symmetrical Functional ROM: Unrestricted ROM Stability: No instability detected Sensory: Unimpaired Muscle strength & Tone: No palpable anomalies  Lumbar Spine Exam  Inspection: No masses, redness, or swelling Alignment: Symmetrical Functional ROM: Decreased ROM      Stability: No instability detected Muscle strength & Tone: Functionally intact Sensory: Movement-associated pain Palpation: Complains of area being tender to palpation       Provocative Tests: Lumbar Hyperextension and rotation test: Positive bilaterally for facet joint pain. Patrick's Maneuver: evaluation deferred today                    Gait & Posture Assessment  Ambulation: Limited Gait: Antalgic Posture: Antalgic   Lower Extremity Exam    Side: Right lower extremity  Side: Left lower extremity  Inspection: No masses, redness, swelling, or asymmetry. No contractures  Inspection: No masses, redness, swelling, or asymmetry. No contractures  Functional ROM: Unrestricted ROM          Functional ROM: Unrestricted ROM          Muscle strength & Tone: Functionally intact  Muscle strength & Tone: Functionally intact  Sensory: Unimpaired  Sensory: Unimpaired  Palpation: No palpable anomalies  Palpation: No palpable anomalies   Assessment  Primary Diagnosis  & Pertinent Problem List: The primary encounter diagnosis was Chronic low back pain (Location of Primary Source of Pain) (Bilateral) (L>R). Diagnoses of Lumbar facet hypertrophy, Lumbar facet syndrome (Bilateral) (L>R), Chronic lower extremity pain (Location of Secondary source of pain) (Bilateral) (L>R), Failed back surgical syndrome, Epidural fibrosis, L2-3 and L3-4 intervertebral disc extrusion, Chronic L4 bilateral pars defect, Grade II Anterolisthesis of L4 over L5, Chronic foot pain (Bilateral) (R>L), Chronic hand pain (Location of Tertiary source of pain) (Bilateral) (R>L), Rheumatoid arthritis involving multiple joints (HCC), Rheumatoid arthritis involving multiple sites with positive rheumatoid factor (HCC), Chronic neck pain (Posterior) (Bilateral) (R>L), Chronic shoulder pain (Bilateral) (R>L), Abnormal MRI, lumbar spine (09/28/2016), Chronic pain syndrome, Restless leg, Long term prescription benzodiazepine use, and Lumbar L3-4 lateral recess stenosis (Bilateral) (L>R) were also pertinent to this visit.  Visit Diagnosis (New problems to examiner): 1. Chronic low back pain (Location of Primary Source of Pain) (Bilateral) (L>R)   2. Lumbar facet hypertrophy   3. Lumbar facet syndrome (Bilateral) (L>R)   4. Chronic lower extremity pain (Location of Secondary source of pain) (Bilateral) (L>R)   5. Failed back surgical syndrome   6. Epidural fibrosis   7. L2-3 and L3-4 intervertebral disc extrusion   8. Chronic L4 bilateral pars defect   9. Grade II Anterolisthesis of L4 over L5   10. Chronic foot pain (Bilateral) (R>L)   11. Chronic hand pain (Location of Tertiary source of pain) (Bilateral) (R>L)   12. Rheumatoid arthritis involving multiple joints (HCC)   13.  Rheumatoid arthritis involving multiple sites with positive rheumatoid factor ([place])   14. Chronic neck pain (Posterior) (Bilateral) (R>L)   15. Chronic shoulder pain (Bilateral) (R>L)   16. Abnormal MRI, lumbar spine (09/28/2016)    17. Chronic pain syndrome   18. Restless leg   19. Long term prescription benzodiazepine use   20. Lumbar L3-4 lateral recess stenosis (Bilateral) (L>R)    Plan of Care (Initial workup plan)  Note: Please be advised that as per protocol, today's visit has been an evaluation only. We have not taken over the patient's controlled substance management.  Problem-specific plan: No problem-specific Assessment & Plan notes found for this encounter.  Ordered Lab-work, Procedure(s), Referral(s), & Consult(s): Orders Placed This Encounter  Procedures  . DG Cervical Spine Complete  . DG Lumbar Spine Complete W/Bend  . DG Shoulder Left  . DG Shoulder Right  . DG Hand Complete Right  . DG Hand Complete Left  . DG Foot Complete Left  . DG Foot Complete Right  . Compliance Drug Analysis, Ur  . C-reactive protein  . Sedimentation rate  . Comprehensive metabolic panel  . Magnesium  . 25-Hydroxyvitamin D Lcms D2+D3  . Vitamin B12  . Ambulatory referral to Psychology   Pharmacotherapy (current): Medications ordered:  No orders of the defined types were placed in this encounter.  Medications administered during this visit: Karen Chen had no medications administered during this visit.   Pharmacological management options:  Opioid Analgesics: The patient was informed that there is no guarantee that she would be a candidate for opioid analgesics. The decision will be made following CDC guidelines. This decision will be based on the results of diagnostic studies, as well as Karen Chen's risk profile.   Membrane stabilizer: To be determined at a later time  Muscle relaxant: To be determined at a later time  NSAID: To be determined at a later time  Other analgesic(s): To be determined at a later time   Interventional management options: Karen Chen was informed that there is no guarantee that she would be a candidate for interventional therapies. The decision will be based on the results of  diagnostic studies, as well as Karen Chen's risk profile.  Dr. Sharlet Salina procedures included: Left L3-4 transforaminal epidural steroid injection 2.(No help) Procedure(s) under consideration:  Diagnostic bilateral lumbar facet block  Possible bilateral lumbar facet RFA  Diagnostic right L2-3 and bilateral L3-4 transforaminal epidural steroid injection  Diagnostic bilateral L4-5 transforaminal epidural steroid injection  Possible bilateral L4-5 transforaminal Racz procedure  Diagnostic bilateral S1 transforaminal epidural steroid injection  Diagnostic caudal epidural steroid injection + diagnostic epidurogram  Possible Racz procedure  Possible spinal cord stimulator trial  Possible spinal cord stimulator implant  Diagnostic right-sided cervical epidural steroid injection  Diagnostic bilateral cervical facet block  Possible bilateral cervical facet RFA  Diagnostic bilateral intra-articular shoulder joint injection  Diagnostic bilateral suprascapular nerve block  Possible bilateral suprascapular nerve RFA    Provider-requested follow-up: Return for 2nd Visit, after MedPsych eval.  No future appointments.  Primary Care Physician: [facility] Location: [facility] Note by: Gaspar Cola, MD Date: 03/07/2017; Time: 1:08 PM  Patient Instructions    Domestic Violence Information What is domestic violence? Domestic violence, also called intimate partner violence, can involve physical, emotional, psychological, sexual, and economic abuse by a current or former intimate partner. Stalking is also considered a type of domestic violence. Domestic violence can happen between people who are or were:  Married.  Dating.  Living together.  Abusers repeatedly act to maintain control and power over their partner. Physical abuse Physical abuse can include:  Slapping.  Hitting.  Kicking.  Punching.  Choking.  Pulling the victim's  hair.  Damaging the victim's property.  Threatening or hurting the victim with weapons.  Trapping the victim in his or her home.  Forcing the victim to use drugs or alcohol.  Emotional and psychological abuse Emotional and psychological abuse can include:  Threats.  Insults.  Isolation.  Humiliation.  Jealousy and possessiveness.  Blame.  Withholding affection.  Intimidation.  Manipulation.  Limiting contact with friends and family.  Sexual abuse Sexual abuse can include:  Forcing sex.  Forcing sexual touching.  Hurting the victim during sex.  Forcing the victim to have sex with other people.  Giving the victim a sexually transmitted disease (STD) on purpose.  Economic abuse Economic abuse can include:  Controlling resources, such as money, food, transportation, a phone, or computer.  Stealing money from the victim or his or her family or friends.  Forbidding the victim to work.  Refusing to work or to contribute to the household.  Stalking Stalking can include:  Making repeated, unwanted phone calls, emails, or text messages.  Leaving cards, letters, flowers or other items the victim does not want.  Watching or following the victim from a distance.  Going to places where the victim does not want the abuser.  Entering the victim's home or car.  Damaging the victim's personal property.  What are some warning signs of domestic violence? Physical signs  Bruises.  Broken bones.  Burns or cuts.  Physical pain.  Head injury. Emotional and psychological signs  Crying.  Depression.  Hopelessness.  Desperation.  Trouble sleeping.  Fear of partner.  Anxiety.  Suicidal behavior.  Antisocial behavior.  Low self-esteem.  Fear of intimacy.  Flashbacks. Sexual signs  Bruising, swelling, or bleeding of the genital or rectal area.  Signs of a sexually transmitted infection, such as genital sores, warts, or discharge  coming from the genital area.  Pain in the genital area.  Unintended pregnancy.  Problems with pregnancy, including delayed prenatal care and prematurity. Economic signs  Having little money or food.  Homelessness.  Asking for or borrowing money. What are common behaviors of those affected by domestic violence? Those affected by domestic violence may:  Be late to work or other events.  Not show up to places as promised.  Have to let their partner know where they are and who they are with.  Be isolated or kept from seeing friends or family.  Make comments about their partner's temper or behavior.  Make excuses for their partner.  Engage in high-risk sexual behaviors.  Use drugs or alcohol.  Have unhealthy diet-related behaviors.  What are common feelings of those affected by domestic violence? Those affected by domestic violence may feel that:  They have to be careful not to say or do things that trigger their partner's anger.  They cannot do anything right.  They deserve to be treated badly.  They are overreacting to their partner's behavior or temper.  They cannot trust their own feelings.  They cannot trust other people.  They are trapped.  Their partner would take away their children.  They are emotionally drained or numb.  Their life is in danger.  They might have to kill their partner to survive.  Where can you get help? Domestic violence hotlines and websites If you do  not feel safe searching for help online at home, use a computer at a [employer] to access the Internet. Call 911 if you are in immediate danger or need medical help.  The [employer]. ? The 24-hour phone hotline is 7628351378 or (671)512-1616 (TTY). ? The videophone is available Monday through Friday, 9 a.m. to 5 p.m. Call 234-653-3212. ? The website is http://thehotline.org  The National Sexual Assault Hotline. ? The 24-hour phone hotline is  229-571-8074. ? You can access the online hotline at http://www.dixon.info/  Shelters for victims of domestic violence If you are a victim of domestic violence, there are resources to help you find a temporary place for you and your children to live (shelter). The specific address of these shelters is often not known to the public. Police Report assaults, threats, and stalking to the police. Counselors and counseling centers People who have been victims of domestic violence can benefit from counseling. Counseling can help you cope with difficult emotions and empower you to plan for your future safety. The topics you discuss with a counselor are private and confidential. Children of domestic violence victims also might need counseling to manage stress and anxiety. The court system You can work with a Chief Executive Officer or an advocate to get legal protection against an abuser. Protection includes restraining orders and private addresses. Crimes against you, such as assault, can also be prosecuted through the courts. Laws vary by state. This information is not intended to replace advice given to you by your health care provider. Make sure you discuss any questions you have with your health care provider. Document Released: 11/05/2003 Document Revised: 07/29/2016 Document Reviewed: 05/02/2014 Elsevier Interactive Patient Education  2018 [employer].  ____________________________________________________________________________________________  Appointment Policy Summary  It is our goal and responsibility to provide the medical community with assistance in the evaluation and management of patients with chronic pain. Unfortunately our resources are limited. Because we do not have an unlimited amount of time, or available appointments, we are required to closely monitor and manage their use. The following rules exist to maximize their use:  Patient's responsibilities: 1. Punctuality:  At what time  should I arrive? You should be physically present in our office 30 minutes before your scheduled appointment. Your scheduled appointment is with your assigned healthcare provider. However, it takes 5-10 minutes to be "checked-in", and another 15 minutes for the nurses to do the admission. If you arrive to our office at the time you were given for your appointment, you will end up being at least 20-25 minutes late to your appointment with the provider. 2. Tardiness:  What happens if I arrive only a few minutes after my scheduled appointment time? You will need to reschedule your appointment. The cutoff is your appointment time. This is why it is so important that you arrive at least 30 minutes before that appointment. If you have an appointment scheduled for 10:00 AM and you arrive at 10:01, you will be required to reschedule your appointment.  3. Plan ahead:  Always assume that you will encounter traffic on your way in. Plan for it. If you are dependent on a driver, make sure they understand these rules and the need to arrive early. 4. Other appointments and responsibilities:  Avoid scheduling any other appointments before or after your pain clinic appointments.  5. Be prepared:  Write down everything that you need to discuss with your healthcare provider and give this information to the admitting nurse. Write down the medications that  you will need refilled. Bring your pills and bottles (even the empty ones), to all of your appointments, except for those where a procedure is scheduled. 6. No children or pets:  Find someone to take care of them. It is not appropriate to bring them in. 7. Scheduling changes:  We request "advanced notification" of any changes or cancellations. 8. Advanced notification:  Defined as a time period of more than 24 hours prior to the originally scheduled appointment. This allows for the appointment to be offered to other patients. 9. Rescheduling:  When a visit is  rescheduled, it will require the cancellation of the original appointment. For this reason they both fall within the category of "Cancellations".  10. Cancellations:  They require advanced notification. Any cancellation less than 24 hours before the  appointment will be recorded as a "No Show". 11. No Show:  Defined as an unkept appointment where the patient failed to notify or declare to the practice their intention or inability to keep the appointment.  Corrective process for repeat offenders:  1. Tardiness: Three (3) episodes of rescheduling due to late arrivals will be recorded as one (1) "No Show". 2. Cancellation or reschedule: Three (3) cancellations or rescheduling will be recorded as one (1) "No Show". 3. "No Shows": Three (3) "No Shows" within a 12 month period will result in discharge from the practice.  ____________________________________________________________________________________________  ____________________________________________________________________________________________  Pain Scale  Introduction: The pain score used by this practice is the Verbal Numerical Rating Scale (VNRS-11). This is an 11-point scale. It is for adults and children 10 years or older. There are significant differences in how the pain score is reported, used, and applied. Forget everything you learned in the past and learn this scoring system.  General Information: The scale should reflect your current level of pain. Unless you are specifically asked for the level of your worst pain, or your average pain. If you are asked for one of these two, then it should be understood that it is over the past 24 hours.  Basic Activities of Daily Living (ADL): Personal hygiene, dressing, eating, transferring, and using restroom.  Instructions: Most patients tend to report their level of pain as a combination of two factors, their physical pain and their psychosocial pain. This last one is also known as  "suffering" and it is reflection of how physical pain affects you socially and psychologically. From now on, report them separately. From this point on, when asked to report your pain level, report only your physical pain. Use the following table for reference.  Pain Clinic Pain Levels (0-5/10)  Pain Level Score  Description  No Pain 0   Mild pain 1 Nagging, annoying, but does not interfere with basic activities of daily living (ADL). Patients are able to eat, bathe, get dressed, toileting (being able to get on and off the toilet and perform personal hygiene functions), transfer (move in and out of bed or a chair without assistance), and maintain continence (able to control bladder and bowel functions). Blood pressure and heart rate are unaffected. A normal heart rate for a healthy adult ranges from 60 to 100 bpm (beats per minute).   Mild to moderate pain 2 Noticeable and distracting. Impossible to hide from other people. More frequent flare-ups. Still possible to adapt and function close to normal. It can be very annoying and may have occasional stronger flare-ups. With discipline, patients may get used to it and adapt.   Moderate pain 3 Interferes significantly with activities of  daily living (ADL). It becomes difficult to feed, bathe, get dressed, get on and off the toilet or to perform personal hygiene functions. Difficult to get in and out of bed or a chair without assistance. Very distracting. With effort, it can be ignored when deeply involved in activities.   Moderately severe pain 4 Impossible to ignore for more than a few minutes. With effort, patients may still be able to manage work or participate in some social activities. Very difficult to concentrate. Signs of autonomic nervous system discharge are evident: dilated pupils (mydriasis); mild sweating (diaphoresis); sleep interference. Heart rate becomes elevated (>115 bpm). Diastolic blood pressure (lower number) rises above 100 mmHg.  Patients find relief in laying down and not moving.   Severe pain 5 Intense and extremely unpleasant. Associated with frowning face and frequent crying. Pain overwhelms the senses.  Ability to do any activity or maintain social relationships becomes significantly limited. Conversation becomes difficult. Pacing back and forth is common, as getting into a comfortable position is nearly impossible. Pain wakes you up from deep sleep. Physical signs will be obvious: pupillary dilation; increased sweating; goosebumps; brisk reflexes; cold, clammy hands and feet; nausea, vomiting or dry heaves; loss of appetite; significant sleep disturbance with inability to fall asleep or to remain asleep. When persistent, significant weight loss is observed due to the complete loss of appetite and sleep deprivation.  Blood pressure and heart rate becomes significantly elevated. Caution: If elevated blood pressure triggers a pounding headache associated with blurred vision, then the patient should immediately seek attention at an urgent or emergency care unit, as these may be signs of an impending stroke.    Emergency Department Pain Levels (6-10/10)  Emergency Room Pain 6 Severely limiting. Requires emergency care and should not be seen or managed at an outpatient pain management facility. Communication becomes difficult and requires great effort. Assistance to reach the emergency department may be required. Facial flushing and profuse sweating along with potentially dangerous increases in heart rate and blood pressure will be evident.   Distressing pain 7 Self-care is very difficult. Assistance is required to transport, or use restroom. Assistance to reach the emergency department will be required. Tasks requiring coordination, such as bathing and getting dressed become very difficult.   Disabling pain 8 Self-care is no longer possible. At this level, pain is disabling. The individual is unable to do even the most "basic"  activities such as walking, eating, bathing, dressing, transferring to a bed, or toileting. Fine motor skills are lost. It is difficult to think clearly.   Incapacitating pain 9 Pain becomes incapacitating. Thought processing is no longer possible. Difficult to remember your own name. Control of movement and coordination are lost.   The worst pain imaginable 10 At this level, most patients pass out from pain. When this level is reached, collapse of the autonomic nervous system occurs, leading to a sudden drop in blood pressure and heart rate. This in turn results in a temporary and dramatic drop in blood flow to the brain, leading to a loss of consciousness. Fainting is one of the body's self defense mechanisms. Passing out puts the brain in a calmed state and causes it to shut down for a while, in order to begin the healing process.    Summary: 1. Refer to this scale when providing [place] with your pain level. 2. Be accurate and careful when reporting your pain level. This will help with your care. 3. Over-reporting your pain level will lead to loss  of credibility. 4. Even a level of 1/10 means that there is pain and will be treated at our facility. 5. High, inaccurate reporting will be documented as "Symptom Exaggeration", leading to loss of credibility and suspicions of possible secondary gains such as obtaining more narcotics, or wanting to appear disabled, for fraudulent reasons. 6. Only pain levels of 5 or below will be seen at our facility. 7. Pain levels of 6 and above will be sent to the Emergency Department and the appointment cancelled.  Please get your x-rays done as soon as possible. ____________________________________________________________________________________________

## 2017-03-07 ENCOUNTER — Ambulatory Visit
Admission: RE | Admit: 2017-03-07 | Discharge: 2017-03-07 | Disposition: A | Discharge status: Home / Self Care | Payer: Medicare Other | Source: Ambulatory Visit | Attending: Pain Medicine | Admitting: Pain Medicine

## 2017-03-07 ENCOUNTER — Ambulatory Visit: Payer: Medicare Other | Attending: Pain Medicine | Admitting: Pain Medicine

## 2017-03-07 ENCOUNTER — Encounter: Payer: Self-pay | Admitting: Pain Medicine

## 2017-03-07 VITALS — BP 119/85 | HR 72 | Temp 98.9°F | Resp 16 | Ht 68.0 in | Wt 145.0 lb

## 2017-03-07 DIAGNOSIS — M79642 Pain in left hand: Secondary | ICD-10-CM | POA: Insufficient documentation

## 2017-03-07 DIAGNOSIS — R937 Abnormal findings on diagnostic imaging of other parts of musculoskeletal system: Secondary | ICD-10-CM

## 2017-03-07 DIAGNOSIS — M069 Rheumatoid arthritis, unspecified: Secondary | ICD-10-CM | POA: Insufficient documentation

## 2017-03-07 DIAGNOSIS — G96198 Other disorders of meninges, not elsewhere classified: Secondary | ICD-10-CM

## 2017-03-07 DIAGNOSIS — M25511 Pain in right shoulder: Secondary | ICD-10-CM

## 2017-03-07 DIAGNOSIS — M79605 Pain in left leg: Secondary | ICD-10-CM

## 2017-03-07 DIAGNOSIS — G8929 Other chronic pain: Secondary | ICD-10-CM | POA: Insufficient documentation

## 2017-03-07 DIAGNOSIS — M0579 Rheumatoid arthritis with rheumatoid factor of multiple sites without organ or systems involvement: Secondary | ICD-10-CM

## 2017-03-07 DIAGNOSIS — M79604 Pain in right leg: Secondary | ICD-10-CM

## 2017-03-07 DIAGNOSIS — I10 Essential (primary) hypertension: Secondary | ICD-10-CM | POA: Diagnosis not present

## 2017-03-07 DIAGNOSIS — M79672 Pain in left foot: Secondary | ICD-10-CM | POA: Insufficient documentation

## 2017-03-07 DIAGNOSIS — F419 Anxiety disorder, unspecified: Secondary | ICD-10-CM | POA: Insufficient documentation

## 2017-03-07 DIAGNOSIS — M79641 Pain in right hand: Secondary | ICD-10-CM | POA: Insufficient documentation

## 2017-03-07 DIAGNOSIS — G2581 Restless legs syndrome: Secondary | ICD-10-CM | POA: Diagnosis not present

## 2017-03-07 DIAGNOSIS — G9619 Other disorders of meninges, not elsewhere classified: Secondary | ICD-10-CM | POA: Insufficient documentation

## 2017-03-07 DIAGNOSIS — M5136 Other intervertebral disc degeneration, lumbar region: Secondary | ICD-10-CM | POA: Diagnosis not present

## 2017-03-07 DIAGNOSIS — M12871 Other specific arthropathies, not elsewhere classified, right ankle and foot: Secondary | ICD-10-CM | POA: Diagnosis not present

## 2017-03-07 DIAGNOSIS — Z79899 Other long term (current) drug therapy: Secondary | ICD-10-CM | POA: Diagnosis not present

## 2017-03-07 DIAGNOSIS — M4310 Spondylolisthesis, site unspecified: Secondary | ICD-10-CM

## 2017-03-07 DIAGNOSIS — M25512 Pain in left shoulder: Secondary | ICD-10-CM | POA: Diagnosis not present

## 2017-03-07 DIAGNOSIS — M79643 Pain in unspecified hand: Secondary | ICD-10-CM

## 2017-03-07 DIAGNOSIS — M50322 Other cervical disc degeneration at C5-C6 level: Secondary | ICD-10-CM | POA: Diagnosis not present

## 2017-03-07 DIAGNOSIS — M4316 Spondylolisthesis, lumbar region: Secondary | ICD-10-CM | POA: Insufficient documentation

## 2017-03-07 DIAGNOSIS — F17210 Nicotine dependence, cigarettes, uncomplicated: Secondary | ICD-10-CM | POA: Insufficient documentation

## 2017-03-07 DIAGNOSIS — M12841 Other specific arthropathies, not elsewhere classified, right hand: Secondary | ICD-10-CM | POA: Diagnosis not present

## 2017-03-07 DIAGNOSIS — Z7982 Long term (current) use of aspirin: Secondary | ICD-10-CM | POA: Insufficient documentation

## 2017-03-07 DIAGNOSIS — M4696 Unspecified inflammatory spondylopathy, lumbar region: Secondary | ICD-10-CM

## 2017-03-07 DIAGNOSIS — M12872 Other specific arthropathies, not elsewhere classified, left ankle and foot: Secondary | ICD-10-CM | POA: Diagnosis not present

## 2017-03-07 DIAGNOSIS — M5126 Other intervertebral disc displacement, lumbar region: Secondary | ICD-10-CM | POA: Diagnosis not present

## 2017-03-07 DIAGNOSIS — M8938 Hypertrophy of bone, other site: Secondary | ICD-10-CM | POA: Diagnosis not present

## 2017-03-07 DIAGNOSIS — M19011 Primary osteoarthritis, right shoulder: Secondary | ICD-10-CM | POA: Diagnosis not present

## 2017-03-07 DIAGNOSIS — G894 Chronic pain syndrome: Secondary | ICD-10-CM | POA: Diagnosis not present

## 2017-03-07 DIAGNOSIS — M47816 Spondylosis without myelopathy or radiculopathy, lumbar region: Secondary | ICD-10-CM

## 2017-03-07 DIAGNOSIS — M79671 Pain in right foot: Secondary | ICD-10-CM | POA: Insufficient documentation

## 2017-03-07 DIAGNOSIS — M542 Cervicalgia: Secondary | ICD-10-CM | POA: Diagnosis not present

## 2017-03-07 DIAGNOSIS — M545 Low back pain, unspecified: Secondary | ICD-10-CM | POA: Insufficient documentation

## 2017-03-07 DIAGNOSIS — M12842 Other specific arthropathies, not elsewhere classified, left hand: Secondary | ICD-10-CM | POA: Diagnosis not present

## 2017-03-07 DIAGNOSIS — M47896 Other spondylosis, lumbar region: Secondary | ICD-10-CM

## 2017-03-07 DIAGNOSIS — M48061 Spinal stenosis, lumbar region without neurogenic claudication: Secondary | ICD-10-CM | POA: Insufficient documentation

## 2017-03-07 DIAGNOSIS — M961 Postlaminectomy syndrome, not elsewhere classified: Secondary | ICD-10-CM

## 2017-03-07 DIAGNOSIS — M79673 Pain in unspecified foot: Secondary | ICD-10-CM

## 2017-03-07 DIAGNOSIS — M19012 Primary osteoarthritis, left shoulder: Secondary | ICD-10-CM | POA: Diagnosis not present

## 2017-03-07 DIAGNOSIS — M50321 Other cervical disc degeneration at C4-C5 level: Secondary | ICD-10-CM | POA: Diagnosis not present

## 2017-03-07 DIAGNOSIS — M4306 Spondylolysis, lumbar region: Secondary | ICD-10-CM

## 2017-03-07 NOTE — Progress Notes (Signed)
Safety precautions to be maintained throughout the outpatient stay will include: orient to surroundings, keep bed in low position, maintain call bell within reach at all times, provide assistance with transfer out of bed and ambulation.  

## 2017-03-07 NOTE — Patient Instructions (Addendum)
Domestic Violence Information What is domestic violence? Domestic violence, also called intimate partner violence, can involve physical, emotional, psychological, sexual, and economic abuse by a current or former intimate partner. Stalking is also considered a type of domestic violence. Domestic violence can happen between people who are or were:  Married.  Dating.  Living together.  Abusers repeatedly act to maintain control and power over their partner. Physical abuse Physical abuse can include:  Slapping.  Hitting.  Kicking.  Punching.  Choking.  Pulling the victim's hair.  Damaging the victim's property.  Threatening or hurting the victim with weapons.  Trapping the victim in his or her home.  Forcing the victim to use drugs or alcohol.  Emotional and psychological abuse Emotional and psychological abuse can include:  Threats.  Insults.  Isolation.  Humiliation.  Jealousy and possessiveness.  Blame.  Withholding affection.  Intimidation.  Manipulation.  Limiting contact with friends and family.  Sexual abuse Sexual abuse can include:  Forcing sex.  Forcing sexual touching.  Hurting the victim during sex.  Forcing the victim to have sex with other people.  Giving the victim a sexually transmitted disease (STD) on purpose.  Economic abuse Economic abuse can include:  Controlling resources, such as money, food, transportation, a phone, or computer.  Stealing money from the victim or his or her family or friends.  Forbidding the victim to work.  Refusing to work or to contribute to the household.  Stalking Stalking can include:  Making repeated, unwanted phone calls, emails, or text messages.  Leaving cards, letters, flowers or other items the victim does not want.  Watching or following the victim from a distance.  Going to places where the victim does not want the abuser.  Entering the victim's home or car.  Damaging  the victim's personal property.  What are some warning signs of domestic violence? Physical signs  Bruises.  Broken bones.  Burns or cuts.  Physical pain.  Head injury. Emotional and psychological signs  Crying.  Depression.  Hopelessness.  Desperation.  Trouble sleeping.  Fear of partner.  Anxiety.  Suicidal behavior.  Antisocial behavior.  Low self-esteem.  Fear of intimacy.  Flashbacks. Sexual signs  Bruising, swelling, or bleeding of the genital or rectal area.  Signs of a sexually transmitted infection, such as genital sores, warts, or discharge coming from the genital area.  Pain in the genital area.  Unintended pregnancy.  Problems with pregnancy, including delayed prenatal care and prematurity. Economic signs  Having little money or food.  Homelessness.  Asking for or borrowing money. What are common behaviors of those affected by domestic violence? Those affected by domestic violence may:  Be late to work or other events.  Not show up to places as promised.  Have to let their partner know where they are and who they are with.  Be isolated or kept from seeing friends or family.  Make comments about their partner's temper or behavior.  Make excuses for their partner.  Engage in high-risk sexual behaviors.  Use drugs or alcohol.  Have unhealthy diet-related behaviors.  What are common feelings of those affected by domestic violence? Those affected by domestic violence may feel that:  They have to be careful not to say or do things that trigger their partner's anger.  They cannot do anything right.  They deserve to be treated badly.  They are overreacting to their partner's behavior or temper.  They cannot trust their own feelings.  They cannot trust other people.  They are trapped.  Their partner would take away their children.  They are emotionally drained or numb.  Their life is in danger.  They might have to  kill their partner to survive.  Where can you get help? Domestic violence hotlines and websites If you do not feel safe searching for help online at home, use a computer at [employer] to access [employer]. Call 911 if you are in immediate danger or need medical help.  [employer]. ? The 24-hour phone hotline is (856) 405-2373 or 3602403677 (TTY). ? The videophone is available Monday through Friday, 9 a.m. to 5 p.m. Call 786-032-4333. ? The website is http://thehotline.org  The National Sexual Assault Hotline. ? The 24-hour phone hotline is 612-729-6939. ? You can access the online hotline at [employer]  Shelters for victims of domestic violence If you are a victim of domestic violence, there are resources to help you find a temporary place for you and your children to live (shelter). The specific address of these shelters is often not known to the public. Police Report assaults, threats, and stalking to the police. Counselors and counseling centers People who have been victims of domestic violence can benefit from counseling. Counseling can help you cope with difficult emotions and empower you to plan for your future safety. The topics you discuss with a counselor are private and confidential. Children of domestic violence victims also might need counseling to manage stress and anxiety. The court system You can work with a Clinical research associate or an advocate to get legal protection against an abuser. Protection includes restraining orders and private addresses. Crimes against you, such as assault, can also be prosecuted through the courts. Laws vary by state. This information is not intended to replace advice given to you by your health care provider. Make sure you discuss any questions you have with your health care provider. Document Released: 11/05/2003 Document Revised: 07/29/2016 Document Reviewed: 05/02/2014 Elsevier Interactive Patient  Education  2018 [employer].  ____________________________________________________________________________________________  Appointment Policy Summary  It is our goal and responsibility to provide the medical community with assistance in the evaluation and management of patients with chronic pain. Unfortunately our resources are limited. Because we do not have an unlimited amount of time, or available appointments, we are required to closely monitor and manage their use. The following rules exist to maximize their use:  Patient's responsibilities: 1. Punctuality:  At what time should I arrive? You should be physically present in our office 30 minutes before your scheduled appointment. Your scheduled appointment is with your assigned healthcare provider. However, it takes 5-10 minutes to be "checked-in", and another 15 minutes for the nurses to do the admission. If you arrive to our office at the time you were given for your appointment, you will end up being at least 20-25 minutes late to your appointment with the provider. 2. Tardiness:  What happens if I arrive only a few minutes after my scheduled appointment time? You will need to reschedule your appointment. The cutoff is your appointment time. This is why it is so important that you arrive at least 30 minutes before that appointment. If you have an appointment scheduled for 10:00 AM and you arrive at 10:01, you will be required to reschedule your appointment.  3. Plan ahead:  Always assume that you will encounter traffic on your way in. Plan for it. If you are dependent on a driver, make sure they understand these rules and the need to arrive early.  4. Other appointments and responsibilities:  Avoid scheduling any other appointments before or after your pain clinic appointments.  5. Be prepared:  Write down everything that you need to discuss with your healthcare provider and give this information to the admitting nurse. Write down the  medications that you will need refilled. Bring your pills and bottles (even the empty ones), to all of your appointments, except for those where a procedure is scheduled. 6. No children or pets:  Find someone to take care of them. It is not appropriate to bring them in. 7. Scheduling changes:  We request "advanced notification" of any changes or cancellations. 8. Advanced notification:  Defined as a time period of more than 24 hours prior to the originally scheduled appointment. This allows for the appointment to be offered to other patients. 9. Rescheduling:  When a visit is rescheduled, it will require the cancellation of the original appointment. For this reason they both fall within the category of "Cancellations".  10. Cancellations:  They require advanced notification. Any cancellation less than 24 hours before the  appointment will be recorded as a "No Show". 11. No Show:  Defined as an unkept appointment where the patient failed to notify or declare to the practice their intention or inability to keep the appointment.  Corrective process for repeat offenders:  1. Tardiness: Three (3) episodes of rescheduling due to late arrivals will be recorded as one (1) "No Show". 2. Cancellation or reschedule: Three (3) cancellations or rescheduling will be recorded as one (1) "No Show". 3. "No Shows": Three (3) "No Shows" within a 12 month period will result in discharge from the practice.  ____________________________________________________________________________________________  ____________________________________________________________________________________________  Pain Scale  Introduction: The pain score used by this practice is the Verbal Numerical Rating Scale (VNRS-11). This is an 11-point scale. It is for adults and children 10 years or older. There are significant differences in how the pain score is reported, used, and applied. Forget everything you learned in the past and learn  this scoring system.  General Information: The scale should reflect your current level of pain. Unless you are specifically asked for the level of your worst pain, or your average pain. If you are asked for one of these two, then it should be understood that it is over the past 24 hours.  Basic Activities of Daily Living (ADL): Personal hygiene, dressing, eating, transferring, and using restroom.  Instructions: Most patients tend to report their level of pain as a combination of two factors, their physical pain and their psychosocial pain. This last one is also known as "suffering" and it is reflection of how physical pain affects you socially and psychologically. From now on, report them separately. From this point on, when asked to report your pain level, report only your physical pain. Use the following table for reference.  Pain Clinic Pain Levels (0-5/10)  Pain Level Score  Description  No Pain 0   Mild pain 1 Nagging, annoying, but does not interfere with basic activities of daily living (ADL). Patients are able to eat, bathe, get dressed, toileting (being able to get on and off the toilet and perform personal hygiene functions), transfer (move in and out of bed or a chair without assistance), and maintain continence (able to control bladder and bowel functions). Blood pressure and heart rate are unaffected. A normal heart rate for a healthy adult ranges from 60 to 100 bpm (beats per minute).   Mild to moderate pain 2 Noticeable and distracting. Impossible  to hide from other people. More frequent flare-ups. Still possible to adapt and function close to normal. It can be very annoying and may have occasional stronger flare-ups. With discipline, patients may get used to it and adapt.   Moderate pain 3 Interferes significantly with activities of daily living (ADL). It becomes difficult to feed, bathe, get dressed, get on and off the toilet or to perform personal hygiene functions. Difficult to get  in and out of bed or a chair without assistance. Very distracting. With effort, it can be ignored when deeply involved in activities.   Moderately severe pain 4 Impossible to ignore for more than a few minutes. With effort, patients may still be able to manage work or participate in some social activities. Very difficult to concentrate. Signs of autonomic nervous system discharge are evident: dilated pupils (mydriasis); mild sweating (diaphoresis); sleep interference. Heart rate becomes elevated (>115 bpm). Diastolic blood pressure (lower number) rises above 100 mmHg. Patients find relief in laying down and not moving.   Severe pain 5 Intense and extremely unpleasant. Associated with frowning face and frequent crying. Pain overwhelms the senses.  Ability to do any activity or maintain social relationships becomes significantly limited. Conversation becomes difficult. Pacing back and forth is common, as getting into a comfortable position is nearly impossible. Pain wakes you up from deep sleep. Physical signs will be obvious: pupillary dilation; increased sweating; goosebumps; brisk reflexes; cold, clammy hands and feet; nausea, vomiting or dry heaves; loss of appetite; significant sleep disturbance with inability to fall asleep or to remain asleep. When persistent, significant weight loss is observed due to the complete loss of appetite and sleep deprivation.  Blood pressure and heart rate becomes significantly elevated. Caution: If elevated blood pressure triggers a pounding headache associated with blurred vision, then the patient should immediately seek attention at an urgent or emergency care unit, as these may be signs of an impending stroke.    Emergency Department Pain Levels (6-10/10)  Emergency Room Pain 6 Severely limiting. Requires emergency care and should not be seen or managed at an outpatient pain management facility. Communication becomes difficult and requires great effort. Assistance to  reach the emergency department may be required. Facial flushing and profuse sweating along with potentially dangerous increases in heart rate and blood pressure will be evident.   Distressing pain 7 Self-care is very difficult. Assistance is required to transport, or use restroom. Assistance to reach the emergency department will be required. Tasks requiring coordination, such as bathing and getting dressed become very difficult.   Disabling pain 8 Self-care is no longer possible. At this level, pain is disabling. The individual is unable to do even the most "basic" activities such as walking, eating, bathing, dressing, transferring to a bed, or toileting. Fine motor skills are lost. It is difficult to think clearly.   Incapacitating pain 9 Pain becomes incapacitating. Thought processing is no longer possible. Difficult to remember your own name. Control of movement and coordination are lost.   The worst pain imaginable 10 At this level, most patients pass out from pain. When this level is reached, collapse of the autonomic nervous system occurs, leading to a sudden drop in blood pressure and heart rate. This in turn results in a temporary and dramatic drop in blood flow to the brain, leading to a loss of consciousness. Fainting is one of the body's self defense mechanisms. Passing out puts the brain in a calmed state and causes it to shut down for a while,  in order to begin the healing process.    Summary: 1. Refer to this scale when providing [place] with your pain level. 2. Be accurate and careful when reporting your pain level. This will help with your care. 3. Over-reporting your pain level will lead to loss of credibility. 4. Even a level of 1/10 means that there is pain and will be treated at our facility. 5. High, inaccurate reporting will be documented as "Symptom Exaggeration", leading to loss of credibility and suspicions of possible secondary gains such as obtaining more narcotics, or wanting  to appear disabled, for fraudulent reasons. 6. Only pain levels of 5 or below will be seen at our facility. 7. Pain levels of 6 and above will be sent to the Emergency Department and the appointment cancelled.  Please get your x-rays done as soon as possible. ____________________________________________________________________________________________

## 2017-03-09 DIAGNOSIS — G96198 Other disorders of meninges, not elsewhere classified: Secondary | ICD-10-CM | POA: Insufficient documentation

## 2017-03-09 DIAGNOSIS — G9619 Other disorders of meninges, not elsewhere classified: Secondary | ICD-10-CM

## 2017-03-09 DIAGNOSIS — M961 Postlaminectomy syndrome, not elsewhere classified: Secondary | ICD-10-CM | POA: Insufficient documentation

## 2017-03-09 DIAGNOSIS — M4310 Spondylolisthesis, site unspecified: Secondary | ICD-10-CM | POA: Insufficient documentation

## 2017-03-09 DIAGNOSIS — M47816 Spondylosis without myelopathy or radiculopathy, lumbar region: Secondary | ICD-10-CM | POA: Insufficient documentation

## 2017-03-09 DIAGNOSIS — M48061 Spinal stenosis, lumbar region without neurogenic claudication: Secondary | ICD-10-CM | POA: Insufficient documentation

## 2017-03-09 DIAGNOSIS — R937 Abnormal findings on diagnostic imaging of other parts of musculoskeletal system: Secondary | ICD-10-CM | POA: Insufficient documentation

## 2017-03-09 DIAGNOSIS — M4306 Spondylolysis, lumbar region: Secondary | ICD-10-CM | POA: Insufficient documentation

## 2017-03-09 DIAGNOSIS — M5126 Other intervertebral disc displacement, lumbar region: Secondary | ICD-10-CM | POA: Insufficient documentation

## 2017-03-11 LAB — COMPREHENSIVE METABOLIC PANEL
ALBUMIN: 4.2 g/dL (ref 3.5–5.5)
ALK PHOS: 98 IU/L (ref 39–117)
ALT: 17 IU/L (ref 0–32)
AST: 15 IU/L (ref 0–40)
Albumin/Globulin Ratio: 1.6 (ref 1.2–2.2)
BILIRUBIN TOTAL: 0.3 mg/dL (ref 0.0–1.2)
BUN / CREAT RATIO: 19 (ref 9–23)
BUN: 15 mg/dL (ref 6–24)
CO2: 26 mmol/L (ref 20–29)
CREATININE: 0.78 mg/dL (ref 0.57–1.00)
Calcium: 9.9 mg/dL (ref 8.7–10.2)
Chloride: 100 mmol/L (ref 96–106)
GFR calc Af Amer: 98 mL/min/{1.73_m2} (ref >59)
GFR calc non Af Amer: 85 mL/min/{1.73_m2} (ref >59)
GLOBULIN, TOTAL: 2.6 g/dL (ref 1.5–4.5)
Glucose: 82 mg/dL (ref 65–99)
Potassium: 3.5 mmol/L (ref 3.5–5.2)
SODIUM: 144 mmol/L (ref 134–144)
Total Protein: 6.8 g/dL (ref 6.0–8.5)

## 2017-03-11 LAB — VITAMIN B12: VITAMIN B 12: 607 pg/mL (ref 232–1245)

## 2017-03-11 LAB — MAGNESIUM: Magnesium: 2 mg/dL (ref 1.6–2.3)

## 2017-03-11 LAB — 25-HYDROXYVITAMIN D LCMS D2+D3
25-HYDROXY, VITAMIN D-2: 2 ng/mL
25-HYDROXY, VITAMIN D-3: 27 ng/mL
25-HYDROXY, VITAMIN D: 29 ng/mL — AB

## 2017-03-11 LAB — COMPLIANCE DRUG ANALYSIS, UR

## 2017-03-11 LAB — C-REACTIVE PROTEIN: CRP: 34.6 mg/L — ABNORMAL HIGH (ref 0.0–4.9)

## 2017-03-11 LAB — SEDIMENTATION RATE: Sed Rate: 38 mm/hr (ref 0–40)

## 2017-03-15 DIAGNOSIS — M5126 Other intervertebral disc displacement, lumbar region: Secondary | ICD-10-CM | POA: Diagnosis not present

## 2017-03-15 DIAGNOSIS — M5416 Radiculopathy, lumbar region: Secondary | ICD-10-CM | POA: Diagnosis not present

## 2017-03-21 DIAGNOSIS — R200 Anesthesia of skin: Secondary | ICD-10-CM | POA: Diagnosis not present

## 2017-03-21 DIAGNOSIS — M545 Low back pain: Secondary | ICD-10-CM | POA: Diagnosis not present

## 2017-03-21 DIAGNOSIS — G8929 Other chronic pain: Secondary | ICD-10-CM | POA: Diagnosis not present

## 2017-03-21 DIAGNOSIS — R29898 Other symptoms and signs involving the musculoskeletal system: Secondary | ICD-10-CM | POA: Diagnosis not present

## 2017-03-27 DIAGNOSIS — M5416 Radiculopathy, lumbar region: Secondary | ICD-10-CM | POA: Diagnosis not present

## 2017-03-27 DIAGNOSIS — M810 Age-related osteoporosis without current pathological fracture: Secondary | ICD-10-CM | POA: Diagnosis not present

## 2017-03-27 DIAGNOSIS — M0579 Rheumatoid arthritis with rheumatoid factor of multiple sites without organ or systems involvement: Secondary | ICD-10-CM | POA: Diagnosis not present

## 2017-04-15 DIAGNOSIS — Z79899 Other long term (current) drug therapy: Secondary | ICD-10-CM | POA: Insufficient documentation

## 2017-04-15 DIAGNOSIS — Z7982 Long term (current) use of aspirin: Secondary | ICD-10-CM | POA: Insufficient documentation

## 2017-04-15 DIAGNOSIS — G4709 Other insomnia: Secondary | ICD-10-CM | POA: Diagnosis not present

## 2017-04-15 DIAGNOSIS — G8929 Other chronic pain: Secondary | ICD-10-CM | POA: Diagnosis not present

## 2017-04-15 DIAGNOSIS — G4701 Insomnia due to medical condition: Secondary | ICD-10-CM | POA: Diagnosis not present

## 2017-04-15 DIAGNOSIS — G2581 Restless legs syndrome: Secondary | ICD-10-CM | POA: Insufficient documentation

## 2017-04-15 DIAGNOSIS — M79605 Pain in left leg: Secondary | ICD-10-CM | POA: Insufficient documentation

## 2017-04-15 DIAGNOSIS — R252 Cramp and spasm: Secondary | ICD-10-CM | POA: Diagnosis not present

## 2017-04-15 DIAGNOSIS — I10 Essential (primary) hypertension: Secondary | ICD-10-CM | POA: Insufficient documentation

## 2017-04-15 DIAGNOSIS — F17200 Nicotine dependence, unspecified, uncomplicated: Secondary | ICD-10-CM | POA: Insufficient documentation

## 2017-04-15 NOTE — ED Triage Notes (Signed)
Patient reports having worsening left leg pain, numbness and weakness.  Patient reports history of ruptured disc.  Patient also reports having increase in her restless leg syndrome.

## 2017-04-16 ENCOUNTER — Emergency Department
Admission: EM | Admit: 2017-04-16 | Discharge: 2017-04-16 | Disposition: A | Discharge status: Home / Self Care | Payer: Medicare Other | Attending: Emergency Medicine | Admitting: Emergency Medicine

## 2017-04-16 DIAGNOSIS — G4701 Insomnia due to medical condition: Secondary | ICD-10-CM

## 2017-04-16 DIAGNOSIS — M79605 Pain in left leg: Secondary | ICD-10-CM

## 2017-04-16 DIAGNOSIS — G2581 Restless legs syndrome: Secondary | ICD-10-CM

## 2017-04-16 DIAGNOSIS — R252 Cramp and spasm: Secondary | ICD-10-CM

## 2017-04-16 LAB — BASIC METABOLIC PANEL
Anion gap: 9 (ref 5–15)
BUN: 21 mg/dL — AB (ref 6–20)
CHLORIDE: 104 mmol/L (ref 101–111)
CO2: 29 mmol/L (ref 22–32)
Calcium: 9 mg/dL (ref 8.9–10.3)
Creatinine, Ser: 0.68 mg/dL (ref 0.44–1.00)
GFR calc Af Amer: >60 mL/min (ref >60)
GFR calc non Af Amer: >60 mL/min (ref >60)
Glucose, Bld: 212 mg/dL — ABNORMAL HIGH (ref 65–99)
POTASSIUM: 2.9 mmol/L — AB (ref 3.5–5.1)
Sodium: 142 mmol/L (ref 135–145)

## 2017-04-16 LAB — CBC
HEMATOCRIT: 38.2 % (ref 35.0–47.0)
HEMOGLOBIN: 13 g/dL (ref 12.0–16.0)
MCH: 32.1 pg (ref 26.0–34.0)
MCHC: 34 g/dL (ref 32.0–36.0)
MCV: 94.2 fL (ref 80.0–100.0)
Platelets: 358 10*3/uL (ref 150–440)
RBC: 4.05 MIL/uL (ref 3.80–5.20)
RDW: 13.8 % (ref 11.5–14.5)
WBC: 10 10*3/uL (ref 3.6–11.0)

## 2017-04-16 LAB — MAGNESIUM: Magnesium: 1.9 mg/dL (ref 1.7–2.4)

## 2017-04-16 MED ORDER — LIDOCAINE 5 % EX PTCH
1.0000 | MEDICATED_PATCH | CUTANEOUS | Status: DC
  Administered 2017-04-16: 1 via TRANSDERMAL
  Filled 2017-04-16: qty 1

## 2017-04-16 MED ORDER — DIAZEPAM 2 MG PO TABS
2.0000 mg | ORAL_TABLET | Freq: Once | ORAL | Status: AC
  Administered 2017-04-16: 2 mg via ORAL
  Filled 2017-04-16: qty 1

## 2017-04-16 MED ORDER — POTASSIUM CHLORIDE CRYS ER 20 MEQ PO TBCR
40.0000 meq | EXTENDED_RELEASE_TABLET | Freq: Once | ORAL | Status: AC
  Administered 2017-04-16: 40 meq via ORAL
  Filled 2017-04-16: qty 2

## 2017-04-16 MED ORDER — LIDOCAINE 5 % EX PTCH: 1.0000 | MEDICATED_PATCH | Freq: Two times a day (BID) | CUTANEOUS | 0 refills | Status: DC

## 2017-04-16 MED ORDER — MAGNESIUM SULFATE 2 GM/50ML IV SOLN
2.0000 g | Freq: Once | INTRAVENOUS | Status: AC
Start: 2017-04-16 — End: 2017-04-16
  Administered 2017-04-16: 2 g via INTRAVENOUS
  Filled 2017-04-16: qty 50

## 2017-04-16 MED ORDER — IBUPROFEN 600 MG PO TABS
600.0000 mg | ORAL_TABLET | Freq: Once | ORAL | Status: AC
  Administered 2017-04-16: 600 mg via ORAL
  Filled 2017-04-16: qty 1

## 2017-04-16 MED ORDER — DIAZEPAM 2 MG PO TABS: 2.0000 mg | ORAL_TABLET | Freq: Four times a day (QID) | ORAL | 0 refills | Status: DC | PRN

## 2017-04-16 NOTE — ED Notes (Signed)
Patient verbalizes understanding of discharge instructions, home care and follow up care. Patient out of department at this time. 

## 2017-04-16 NOTE — Discharge Instructions (Signed)
Please follow up with your primary care physician. Please do not take your alprazolam along with your valium.

## 2017-04-16 NOTE — ED Provider Notes (Signed)
[facility] Emergency Department Provider Note   ____________________________________________   First MD Initiated Contact with Patient 04/16/17 0236     (approximate)  I have reviewed the triage vital signs and the nursing notes.   HISTORY  Chief Complaint Leg Pain    HPI Karen Chen is a 56 y.o. female who comes into the hospital today with some left leg pain. The patient states that she has a ruptured disc for a while and her doctors are having a hard time figuring out her symptoms. She reports that although she has a ruptured disc her discomfort and her leg symptoms do not match the disc that is ruptured. She reports that she has some restless leg symptoms and it's been worse due to this bulging disc. The patient states that she feels like her muscles tightening in the back of her leg. She feels that her left hamstring is swollen and tight. She reports that she has to keep her legs moving. She is unable to sleep because she can't sit still. She's been taking Xanax, gabapentin and rhythm without any improvement. The patient also has a history of rheumatoid arthritis but she is unable to take anything. She states that she also has possible Parkinson's disease. The patient states that when she was last here for the same symptoms she received a shot but it didn't help with her leg pains. She reports that her left leg feels a little weak denies any heaviness. She reports it is just tightening. She called her doctor and as he is out of town he told her to come in and get checked out. The patient reports that she cannot tolerate the symptoms anymore. She reports that she stopped taking her sleeping medication because she felt it wasn't working. She decided to come in for evaluation.   Past Medical History:  Diagnosis Date  . Anxiety   . Chronic lower back pain   . Hypertension   . Restless leg   . Rheumatoid arthritis [facility])     Patient Active Problem List     Diagnosis Date Noted  . Failed back surgical syndrome 03/09/2017  . Grade II Anterolisthesis of L4 over L5 03/09/2017  . Epidural fibrosis 03/09/2017  . Chronic L4 bilateral pars defect 03/09/2017  . L2-3 and L3-4 intervertebral disc extrusion 03/09/2017  . Abnormal MRI, lumbar spine (09/28/2016) 03/09/2017  . Lumbar facet hypertrophy 03/09/2017  . Lumbar facet syndrome (Bilateral) (L>R) 03/09/2017  . Lumbar L3-4 lateral recess stenosis (Bilateral) (L>R) 03/09/2017  . Chronic pain syndrome 03/07/2017  . Long-term use of high-risk medication 03/07/2017  . Long term prescription benzodiazepine use 03/07/2017  . Chronic low back pain (Location of Primary Source of Pain) (Bilateral) (L>R) 03/07/2017  . Chronic lower extremity pain (Location of Secondary source of pain) (Bilateral) (L>R) 03/07/2017  . Chronic hand pain (Location of Tertiary source of pain) (Bilateral) (R>L) 03/07/2017  . Chronic neck pain (Posterior) (Bilateral) (R>L) 03/07/2017  . Chronic shoulder pain (Bilateral) (R>L) 03/07/2017  . Chronic foot pain (Bilateral) (R>L) 03/07/2017  . TIA (transient ischemic attack) 09/16/2016  . CVA (cerebral vascular accident) (HCC) 09/15/2016  . Rheumatoid arthritis involving multiple sites with positive rheumatoid factor (HCC) 05/04/2016  . ADHD (attention deficit hyperactivity disorder) 03/25/2015  . Bite from insect 03/25/2015  . PTSD (post-traumatic stress disorder) 03/25/2015  . Anxiety 03/26/2014  . Hypertension 03/26/2014  . Restless leg 09/20/2013    Past Surgical History:  Procedure Laterality Date  . BACK SURGERY  1980    Prior to Admission medications   Medication Sig Start Date End Date Taking? Authorizing Provider  ALPRAZolam Prudy Feeler) 0.5 MG tablet Take 0.5 mg by mouth at bedtime as needed.  04/29/16   [provider]  aspirin 81 MG tablet Take 81 mg by mouth daily.    [provider]  diazepam (VALIUM) 2 MG tablet Take 1 tablet (2 mg total) by mouth  every 6 (six) hours as needed for anxiety. 04/16/17   Rebecka Apley, MD  escitalopram (LEXAPRO) 20 MG tablet Take 20 mg by mouth daily.    [provider]  gabapentin (NEURONTIN) 300 MG capsule Take 300 mg by mouth at bedtime.  09/05/13   [provider]  hydrochlorothiazide (HYDRODIURIL) 12.5 MG tablet Take 25 mg by mouth daily.     [provider]  lidocaine (LIDODERM) 5 % Place 1 patch onto the skin every 12 (twelve) hours. Remove & Discard patch within 12 hours or as directed by MD 04/16/17 04/16/18  Rebecka Apley, MD  omeprazole (PRILOSEC) 20 MG capsule Take 20 mg by mouth daily.    [provider]  potassium chloride (K-DUR) 10 MEQ tablet Take 10 mEq by mouth daily.    [provider]  rOPINIRole (REQUIP) 4 MG tablet Take 4 mg by mouth at bedtime. 2 tablets 09/05/13   [provider]    Allergies Orencia [abatacept]; Sulfa antibiotics; Azathioprine; Indocin [indomethacin]; Methotrexate derivatives; Remicade [infliximab]; and Adalimumab  Family History  Problem Relation Age of Onset  . Heart failure Father   . Hypertension Father   . Diabetes Sister     Social History Social History  Substance Use Topics  . Smoking status: Current Every Day Smoker    Packs/day: 1.00    Years: 35.00  . Smokeless tobacco: Never Used  . Alcohol use No    Review of Systems  Constitutional: No fever/chills Eyes: No visual changes. ENT: No sore throat. Cardiovascular: Denies chest pain. Respiratory: Denies shortness of breath. Gastrointestinal: No abdominal pain.  No nausea, no vomiting.  No diarrhea.  No constipation. Genitourinary: Negative for dysuria. Musculoskeletal: Left leg pain, restlessness Skin: Negative for rash. Neurological: Negative for headaches, focal weakness or numbness.   ____________________________________________   PHYSICAL EXAM:  VITAL SIGNS: ED Triage Vitals  Enc Vitals Group     BP 04/16/17 0002 133/89      Pulse Rate 04/16/17 0002 73     Resp 04/16/17 0002 (!) 22     Temp 04/16/17 0002 98.4 F (36.9 C)     Temp Source 04/16/17 0002 Oral     SpO2 04/16/17 0002 95 %     Weight 04/16/17 0000 150 lb (68 kg)     Height 04/16/17 0000 5\' 8"  (1.727 m)     Head Circumference --      Peak Flow --      Pain Score 04/16/17 0000 10     Pain Loc --      Pain Edu? --      Excl. in GC? --     Constitutional: Alert and oriented. Well appearing and in Mild distress. Eyes: Conjunctivae are normal. PERRL. EOMI. Head: Atraumatic. Nose: No congestion/rhinnorhea. Mouth/Throat: Mucous membranes are moist.  Oropharynx non-erythematous. Cardiovascular: Normal rate, regular rhythm. Grossly normal heart sounds.  Good peripheral circulation. Respiratory: Normal respiratory effort.  No retractions. Lungs CTAB. Gastrointestinal: Soft and nontender. No distention. Positive bowel sounds Musculoskeletal: No lower extremity tenderness nor edema.  Left hamstring  with some tightness and tenderness to palpation. Left SI joint tenderness and lower back tenderness Neurologic:  Normal speech and language.  Skin:  Skin is warm, dry and intact.  Psychiatric: Mood and affect are normal.   ____________________________________________   LABS (all labs ordered are listed, but only abnormal results are displayed)  Labs Reviewed  BASIC METABOLIC PANEL - Abnormal; Notable for the following:       Result Value   Potassium 2.9 (*)    Glucose, Bld 212 (*)    BUN 21 (*)    All other components within normal limits  CBC  MAGNESIUM   ____________________________________________  EKG  none ____________________________________________  RADIOLOGY  No results found.  ____________________________________________   PROCEDURES  Procedure(s) performed: None  Procedures  Critical Care performed: No  ____________________________________________   INITIAL IMPRESSION / ASSESSMENT AND PLAN / ED  COURSE  Pertinent labs & imaging results that were available during my care of the patient were reviewed by me and considered in my medical decision making (see chart for details).  This is a 56 year old female who comes into the hospital today with some left leg pain. The patient states that she has a bulging disc and she also has restless leg syndrome. The patient has been having some difficulty dealing with these symptoms for some time. I did give the patient some Valium and a Lidoderm patch to her left leg. Check some blood work with a concern that she may have similar joint abnormalities and she was found to have a potassium of 2.9 a magnesium of 1.9. I will give the patient is dose of potassium orally and some IV magnesium. I will then reassess the patient.     After the potassium and the magnesium the patient reports that she feels much better. She is no longer pacing and she is able to lay comfortably. I Wilson the patient home with some Lidoderm as well as some Valium 5 encourage the patient did not take her Valium along with her alprazolam. The patient should follow back up with her primary care physician for further evaluation. She'll be discharged home. ____________________________________________   FINAL CLINICAL IMPRESSION(S) / ED DIAGNOSES  Final diagnoses:  Left leg pain  Muscle cramps  Insomnia due to restless legs syndrome  Restless leg      NEW MEDICATIONS STARTED DURING THIS VISIT:  New Prescriptions   DIAZEPAM (VALIUM) 2 MG TABLET    Take 1 tablet (2 mg total) by mouth every 6 (six) hours as needed for anxiety.   LIDOCAINE (LIDODERM) 5 %    Place 1 patch onto the skin every 12 (twelve) hours. Remove & Discard patch within 12 hours or as directed by MD     Note:  This document was prepared using Dragon voice recognition software and may include unintentional dictation errors.    Rebecka Apley, MD 04/16/17 [phone]

## 2017-04-16 NOTE — ED Notes (Addendum)
Pt states "i have got the worst restless legs you'll ever encounter in your life". Pt states she also has low back pain. Pt uses a cane to ambulate normally. Bilateral feet are swollen, slightly dusky in color. Pt states her feet are "a normal" color for her. Pt states swelling is new. Difficult to assess feet due to pt's need for constant ambulation for "restless legs". Pt states bilateral legs feel weak and are intermittently numb. md in at this time to assess pt.

## 2017-04-17 ENCOUNTER — Other Ambulatory Visit: Payer: Self-pay

## 2017-04-20 DIAGNOSIS — M5117 Intervertebral disc disorders with radiculopathy, lumbosacral region: Secondary | ICD-10-CM | POA: Diagnosis not present

## 2017-04-20 DIAGNOSIS — M069 Rheumatoid arthritis, unspecified: Secondary | ICD-10-CM | POA: Diagnosis not present

## 2017-04-20 DIAGNOSIS — E876 Hypokalemia: Secondary | ICD-10-CM | POA: Diagnosis not present

## 2017-04-24 ENCOUNTER — Ambulatory Visit: Payer: Medicare Other | Attending: Pain Medicine | Admitting: Pain Medicine

## 2017-04-24 ENCOUNTER — Encounter: Payer: Self-pay | Admitting: Pain Medicine

## 2017-04-24 VITALS — BP 143/67 | HR 72 | Temp 97.6°F | Ht 68.0 in | Wt 147.0 lb

## 2017-04-24 DIAGNOSIS — E559 Vitamin D deficiency, unspecified: Secondary | ICD-10-CM | POA: Insufficient documentation

## 2017-04-24 DIAGNOSIS — G8929 Other chronic pain: Secondary | ICD-10-CM | POA: Diagnosis not present

## 2017-04-24 DIAGNOSIS — Z7982 Long term (current) use of aspirin: Secondary | ICD-10-CM | POA: Insufficient documentation

## 2017-04-24 DIAGNOSIS — Z7952 Long term (current) use of systemic steroids: Secondary | ICD-10-CM | POA: Diagnosis not present

## 2017-04-24 DIAGNOSIS — M48061 Spinal stenosis, lumbar region without neurogenic claudication: Secondary | ICD-10-CM | POA: Insufficient documentation

## 2017-04-24 DIAGNOSIS — F4310 Post-traumatic stress disorder, unspecified: Secondary | ICD-10-CM | POA: Diagnosis not present

## 2017-04-24 DIAGNOSIS — Z882 Allergy status to sulfonamides status: Secondary | ICD-10-CM | POA: Diagnosis not present

## 2017-04-24 DIAGNOSIS — I10 Essential (primary) hypertension: Secondary | ICD-10-CM | POA: Diagnosis not present

## 2017-04-24 DIAGNOSIS — Z79899 Other long term (current) drug therapy: Secondary | ICD-10-CM | POA: Diagnosis not present

## 2017-04-24 DIAGNOSIS — M961 Postlaminectomy syndrome, not elsewhere classified: Secondary | ICD-10-CM | POA: Diagnosis not present

## 2017-04-24 DIAGNOSIS — Z833 Family history of diabetes mellitus: Secondary | ICD-10-CM | POA: Insufficient documentation

## 2017-04-24 DIAGNOSIS — M545 Low back pain: Secondary | ICD-10-CM

## 2017-04-24 DIAGNOSIS — Z881 Allergy status to other antibiotic agents status: Secondary | ICD-10-CM | POA: Diagnosis not present

## 2017-04-24 DIAGNOSIS — M79643 Pain in unspecified hand: Secondary | ICD-10-CM | POA: Diagnosis not present

## 2017-04-24 DIAGNOSIS — G894 Chronic pain syndrome: Secondary | ICD-10-CM | POA: Diagnosis not present

## 2017-04-24 DIAGNOSIS — G2581 Restless legs syndrome: Secondary | ICD-10-CM | POA: Insufficient documentation

## 2017-04-24 DIAGNOSIS — M0589 Other rheumatoid arthritis with rheumatoid factor of multiple sites: Secondary | ICD-10-CM | POA: Insufficient documentation

## 2017-04-24 DIAGNOSIS — Z8249 Family history of ischemic heart disease and other diseases of the circulatory system: Secondary | ICD-10-CM | POA: Insufficient documentation

## 2017-04-24 DIAGNOSIS — F419 Anxiety disorder, unspecified: Secondary | ICD-10-CM | POA: Insufficient documentation

## 2017-04-24 DIAGNOSIS — M488X6 Other specified spondylopathies, lumbar region: Secondary | ICD-10-CM | POA: Diagnosis not present

## 2017-04-24 DIAGNOSIS — F909 Attention-deficit hyperactivity disorder, unspecified type: Secondary | ICD-10-CM | POA: Insufficient documentation

## 2017-04-24 DIAGNOSIS — M79642 Pain in left hand: Secondary | ICD-10-CM | POA: Diagnosis not present

## 2017-04-24 DIAGNOSIS — M79604 Pain in right leg: Secondary | ICD-10-CM | POA: Insufficient documentation

## 2017-04-24 DIAGNOSIS — F17210 Nicotine dependence, cigarettes, uncomplicated: Secondary | ICD-10-CM | POA: Insufficient documentation

## 2017-04-24 DIAGNOSIS — M79605 Pain in left leg: Secondary | ICD-10-CM | POA: Diagnosis not present

## 2017-04-24 DIAGNOSIS — M79641 Pain in right hand: Secondary | ICD-10-CM | POA: Diagnosis not present

## 2017-04-24 DIAGNOSIS — M5126 Other intervertebral disc displacement, lumbar region: Secondary | ICD-10-CM | POA: Diagnosis not present

## 2017-04-24 DIAGNOSIS — E876 Hypokalemia: Secondary | ICD-10-CM | POA: Diagnosis not present

## 2017-04-24 DIAGNOSIS — M79671 Pain in right foot: Secondary | ICD-10-CM | POA: Insufficient documentation

## 2017-04-24 DIAGNOSIS — M79672 Pain in left foot: Secondary | ICD-10-CM | POA: Insufficient documentation

## 2017-04-24 DIAGNOSIS — M549 Dorsalgia, unspecified: Secondary | ICD-10-CM | POA: Diagnosis present

## 2017-04-24 DIAGNOSIS — Z8673 Personal history of transient ischemic attack (TIA), and cerebral infarction without residual deficits: Secondary | ICD-10-CM | POA: Diagnosis not present

## 2017-04-24 MED ORDER — ERGOCALCIFEROL 1.25 MG (50000 UT) PO CAPS: 50000.0000 [IU] | ORAL_CAPSULE | ORAL | 0 refills | Status: AC

## 2017-04-24 MED ORDER — CHOLECALCIFEROL 125 MCG (5000 UT) PO CAPS: 5000.0000 [IU] | ORAL_CAPSULE | Freq: Every day | ORAL | 0 refills | Status: AC

## 2017-04-24 MED ORDER — CALCIUM PLUS D3 ABSORBABLE 600-2500 MG-UNIT PO CAPS: 1.0000 | ORAL_CAPSULE | Freq: Every day | ORAL | 0 refills | Status: AC

## 2017-04-24 NOTE — Progress Notes (Signed)
Safety precautions to be maintained throughout the outpatient stay will include: orient to surroundings, keep bed in low position, maintain call bell within reach at all times, provide assistance with transfer out of bed and ambulation.  

## 2017-04-24 NOTE — Progress Notes (Signed)
Patient's Name: Karen Chen  MRN: [id number]  Referring Provider: [facility]  DOB: [birth year]  PCP: [facility]  DOS: 04/24/2017  Note by: Gaspar Cola, MD  Service setting: Ambulatory outpatient  Specialty: Interventional Pain Management  Location: [facility] (AMB) Pain Management Facility    Patient type: Established   Primary Reason(s) for Visit: Encounter for evaluation before starting new chronic pain management plan of care (Level of risk: moderate) CC: Back Pain (all the way down her spine) and Hand Pain (bilateral with numbness)  HPI  Karen Chen is a 56 y.o. year old, female patient, who comes today for a follow-up evaluation to review the test results and decide on a treatment plan. She has Restless leg; Rheumatoid arthritis involving multiple sites with positive rheumatoid factor (HCC); CVA (cerebral vascular accident) ([place]); TIA (transient ischemic attack); ADHD (attention deficit hyperactivity disorder); Anxiety; Bite from insect; Hypertension; PTSD (post-traumatic stress disorder); Chronic pain syndrome; Long-term use of high-risk medication; Long term prescription benzodiazepine use; Chronic low back pain (Primary Source of Pain) (Bilateral) (L>R); Chronic lower extremity pain (Secondary source of pain) (Bilateral) (L>R); Chronic hand pain [facility] source of pain) (Bilateral) (R>L); Chronic neck pain (Posterior) (Bilateral) (R>L); Chronic shoulder pain (Bilateral) (R>L); Chronic foot pain (Bilateral) (R>L); Failed back surgical syndrome; Grade II Anterolisthesis of L4 over L5; Epidural fibrosis; Chronic L4 bilateral pars defect; L2-3 and L3-4 intervertebral disc extrusion; Abnormal MRI, lumbar spine (09/28/2016); Lumbar facet hypertrophy; Lumbar facet syndrome (Bilateral) (L>R); Lumbar L3-4 lateral recess stenosis (Bilateral) (L>R); Hypokalemia; and Vitamin D insufficiency on her problem list. Her primarily concern today is the Back Pain (all the way down  her spine) and Hand Pain (bilateral with numbness)  Pain Assessment: Location: Lower, Mid, Upper Back Radiating: leg -- left, hands Onset: More than a month ago Duration: Chronic pain Quality: Throbbing, Sharp Severity: 8 /10 (self-reported pain score)  Note: Reported level is compatible with observation.                   Timing: Intermittent Modifying factors: activity, heat  Karen Chen comes in today for a follow-up visit after her initial evaluation on 03/07/2017. Today we went over the results of her tests. These were explained in "Layman's terms". During today's appointment we went over my diagnostic impression, as well as the proposed treatment plan.  According to the patient the primary area of pain is that of the lower back but the left side being worst on the right. The patient indicates having had one back surgery around the 1980s at the [facility] in [place]. She denies any side effects or complications. This first surgery was done due to problems with low back pain and left lower extremity pain. The low back pain and leg pain completely went away for several years and approximately 1 year ago it came back. The patient indicates having had physical therapy at the [facility] 1 day due to the fact that she could not afford any more than that. She describes that it did not help. The patient had II nerve blocks done by Dr. Sharlet Salina with the last one having been done one month ago. She indicates that it did not help her pain. She also indicates recently having had some x-rays and perhaps a CT scan. Dr. Sharlet Salina procedures included: Left L3-4 transforaminal epidural steroid injection 2.  The patient's secondary area pain is that of the lower extremities with the left being worst on the right. In the  case of the left lower extremity the pain go down to the lateral aspect of her calf but does not enter into the area of the foot. The pain pattern is that of a radiculitis. The  patient denies any physical therapy, surgery, nerve blocks, joint injections on the left lower extremity. She does admit to having had an ultrasound study which was found to be negative. In the case of the right lower extremity the pain travels down the leg when area just below the knee through the posterior aspect of the leg. This pain pattern seems to follow more of a referred pain pattern. She again denies any prior physical therapy, surgery, nerve blocks, or joint injections in that leg. She again indicates having had an ultrasound done which was shown to be negative.  The third area of pain is described to be that of the hands with the right being worst on the left. The patient indicates being right handed. She also has rheumatoid arthritis with ulnar deviation of her fingers. She denies any prior surgeries, nerve blocks, joint injections, x-rays, or physical therapy for her hands. Her rheumatoid arthritis is being treated by Dr. Lyndon Code. She usually takes ibuprofen + prednisone 10 mg by mouth twice a day.  The patient's fourth area of pain is that of the neck with the pain being primarily in the posterior aspect of bilateral but with the right side being worst on the left. She denies any prior surgeries, physical therapy, nerve blocks, joint injections, or any recent x-rays.  The fifth area of pain is that of her shoulders with the right being worst on the left. Once again she denies any prior shoulder surgeries, physical therapy, nerve blocks, joint injections, or recent x-rays.  Next she complains about bilateral feet pain with the right being worst on the left. The pain is described to be in the bottom of her feet and she denies any prior surgeries, nerve blocks, joint injections, physical therapy, x-rays, or recent nerve conduction test. In addition to this, the patient also complains of restless leg syndrome for which she takes Requip, gabapentin, and alprazolam. She also suffers from  anxiety.  Dr. Sharlet Salina procedures included: Left L3-4 transforaminal epidural steroid injection 2. (No help)  In considering the treatment plan options, Ms. Beddow was reminded that I no longer take patients for medication management only. I asked her to let me know if she had no intention of taking advantage of the interventional therapies, so that we could make arrangements to provide this space to someone interested. I also made it clear that undergoing interventional therapies for the purpose of getting pain medications is very inappropriate on the part of a patient, and it will not be tolerated in this practice. This type of behavior would suggest true addiction and therefore it requires referral to an addiction specialist.   Despite our best efforts in explained to the patient that she has multiple interventional options that we can take advantage of, in order to lower her pain, she has indicated that she is not willing to try anymore since the injections done by Dr. Sharlet Salina did not help her. I tried to explain to the patient that since her primary pain is the lower back, based on her MRIs and x-rays, my impression was that she would benefit from a diagnostic bilateral lumbar facet block and possibly radiofrequency. However, she has indicated that she is not interested in any interventional therapies and all she wants this to get some pain  medicine. Although the patient does have pathology that would justify the use of these medications, they should be written by Dr. Sharlet Salina are rather than loss, since we were not involved with her prior treatments.  Controlled Substance Pharmacotherapy Assessment REMS (Risk Evaluation and Mitigation Strategy)  Analgesic: None Highest recorded MME/day: 30 mg/day MME/day: 0 mg/day Pill Count: None expected due to no prior prescriptions written by our practice. Hart Rochester, RN  04/24/2017  9:02 AM  Sign at close encounter Safety precautions to be maintained  throughout the outpatient stay will include: orient to surroundings, keep bed in low position, maintain call bell within reach at all times, provide assistance with transfer out of bed and ambulation.    Pharmacokinetics: Liberation and absorption (onset of action): WNL Distribution (time to peak effect): WNL Metabolism and excretion (duration of action): WNL         Pharmacodynamics: Desired effects: Analgesia: Ms. Dierolf reports >50% benefit. Functional ability: Patient reports that medication allows her to accomplish basic ADLs Clinically meaningful improvement in function (CMIF): Sustained CMIF goals met Perceived effectiveness: Described as relatively effective, allowing for increase in activities of daily living (ADL) Undesirable effects: Side-effects or Adverse reactions: None reported Monitoring: [place] PMP: Online review of the past 73-monthperiod previously conducted. Not applicable at this point since we have not taken over the patient's medication management yet. List of all Serum Drug Screening Test(s):  No results found for: AMPHSCRSER, BARBSCRSER, BENZOSCRSER, COCAINSCRSER, PCPSCRSER, THCSCRSER, OPIATESCRSER, OMamers PInvernessList of all UDS test(s) done:  Lab Results  Component Value Date   SUMMARY FINAL 03/07/2017   Last UDS on record: Summary  Date Value Ref Range Status  03/07/2017 FINAL  Final    Comment:    ==================================================================== TOXASSURE COMP DRUG ANALYSIS,UR ==================================================================== Test                             Result       Flag       Units Drug Present and Declared for Prescription Verification   Alprazolam                     86           EXPECTED   ng/mg creat   Alpha-hydroxyalprazolam        109          EXPECTED   ng/mg creat    Source of alprazolam is a scheduled prescription medication.    Alpha-hydroxyalprazolam is an expected metabolite of alprazolam.    Gabapentin                     PRESENT      EXPECTED   Citalopram                     PRESENT      EXPECTED   Desmethylcitalopram            PRESENT      EXPECTED    Desmethylcitalopram is an expected metabolite of citalopram or    the enantiomeric form, escitalopram. Drug Present not Declared for Prescription Verification   Fluoxetine                     PRESENT      UNEXPECTED   Norfluoxetine                  PRESENT  UNEXPECTED    Norfluoxetine is an expected metabolite of fluoxetine.   Mirtazapine                    PRESENT      UNEXPECTED   Ibuprofen                      PRESENT      UNEXPECTED Drug Absent but Declared for Prescription Verification   Salicylate                     Not Detected UNEXPECTED    Aspirin, as indicated in the declared medication list, is not    always detected even when used as directed. ==================================================================== Test                      Result    Flag   Units      Ref Range   Creatinine              74               mg/dL      >=20 ==================================================================== Declared Medications:  The flagging and interpretation on this report are based on the  following declared medications.  Unexpected results may arise from  inaccuracies in the declared medications.  **Note: The testing scope of this panel includes these medications:  Alprazolam  Escitalopram  Gabapentin  **Note: The testing scope of this panel does not include small to  moderate amounts of these reported medications:  Aspirin  **Note: The testing scope of this panel does not include following  reported medications:  Hydrochlorothiazide  Omeprazole  Potassium  Ropinirole ==================================================================== For clinical consultation, please call [phone]. ====================================================================    UDS interpretation: Unexpected  findings not considered significantly abnormal          Medication Assessment Form: Not applicable. Treatment compliance: Not applicable Risk Assessment Profile: Aberrant behavior: claims that "nothing else works", continued use despite claims of ineffective analgesia, diminished ability to recognize a problem with one's behavior or use of the medication, dysfunctional emotional process, extensive time discussing medicaiton and frequent emergency department visits for pain Comorbid factors increasing risk of overdose: concomitant use of Benzodiazepines Medical Psychology Evaluation: Please see scanned results in medical record.     Opioid Risk Tool - 03/07/17 1103      Family History of Substance Abuse   Alcohol Positive Female   Illegal Drugs Positive Female   Rx Drugs Negative     Personal History of Substance Abuse   Alcohol Negative   Illegal Drugs Negative   Rx Drugs Negative     Age   Age between 29-45 years  No     History of Preadolescent Sexual Abuse   History of Preadolescent Sexual Abuse Negative or Female     Psychological Disease   Psychological Disease Negative   Depression Negative     Total Score   Opioid Risk Tool Scoring 3   Opioid Risk Interpretation Low Risk     ORT Scoring interpretation table:  Score <3 = Low Risk for SUD  Score between 4-7 = Moderate Risk for SUD  Score >8 = High Risk for Opioid Abuse   Risk Mitigation Strategies:  Patient opioid safety counseling:  Not applicable. Patient-Prescriber Agreement (PPA): No agreement signed.  Controlled substance notification to other providers: Not applicable  Pharmacologic Plan: Burnis Medin will not be taken over the patient's medication.  Laboratory Chemistry  Inflammation Markers (CRP: Acute Phase) (ESR: Chronic Phase) Lab Results  Component Value Date   CRP 34.6 (H) 03/07/2017   ESRSEDRATE 38 03/07/2017                 Renal Function Markers Lab Results  Component Value Date   BUN  21 (H) 04/16/2017   CREATININE 0.68 04/16/2017   GFRAA >60 04/16/2017   GFRNONAA >60 04/16/2017                 Hepatic Function Markers Lab Results  Component Value Date   AST 15 03/07/2017   ALT 17 03/07/2017   ALBUMIN 4.2 03/07/2017   ALKPHOS 98 03/07/2017   HCVAB 0.4 05/04/2016                 Electrolytes Lab Results  Component Value Date   NA 142 04/16/2017   K 2.9 (L) 04/16/2017   CL 104 04/16/2017   CALCIUM 9.0 04/16/2017   MG 1.9 04/16/2017                 Neuropathy Markers Lab Results  Component Value Date   VITAMINB12 607 03/07/2017                 Bone Pathology Markers Lab Results  Component Value Date   ALKPHOS 98 03/07/2017   25OHVITD1 29 (L) 03/07/2017   25OHVITD2 2.0 03/07/2017   25OHVITD3 27 03/07/2017   CALCIUM 9.0 04/16/2017                 Coagulation Parameters Lab Results  Component Value Date   PLT 358 04/16/2017                 Cardiovascular Markers Lab Results  Component Value Date   HGB 13.0 04/16/2017   HCT 38.2 04/16/2017                 Note: Lab results reviewed.  Recent Diagnostic Imaging Review  Cervical Imaging: Cervical DG complete:  Results for orders placed during the hospital encounter of 03/07/17  DG Cervical Spine Complete   Narrative CLINICAL DATA:  56 year old female with rheumatoid arthritis. Chronic neck pain. Initial encounter.  EXAM: CERVICAL SPINE - COMPLETE 4+ VIEW  COMPARISON:  None.  FINDINGS: Straightening of the cervical spine.  Mild C4-5 and C5-6 disc space narrowing and spur.  No bony neural foraminal narrowing.  No abnormal prevertebral soft tissue swelling.  No bony erosions.  Lung apices clear.  Question right carotid bifurcation calcifications.  IMPRESSION: Mild C4-5 and C5-6 disc space narrowing and spur.   Electronically Signed   By: Genia Del M.D.   On: 03/07/2017 17:21    Shoulder Imaging: Shoulder-R DG:  Results for orders placed during the hospital  encounter of 03/07/17  DG Shoulder Right   Narrative CLINICAL DATA:  56 year old female rheumatoid arthritis. Chronic bilateral shoulder pain. Initial encounter.  EXAM: RIGHT SHOULDER - 2+ VIEW  COMPARISON:  None.  FINDINGS: Minimal acromioclavicular joint degenerative changes.  No abnormal soft tissue calcifications.  No fracture or dislocation.  No bony erosions.  Visualized lungs clear.  IMPRESSION: Minimal acromioclavicular joint degenerative changes.   Electronically Signed   By: Genia Del M.D.   On: 03/07/2017 17:28    Shoulder-L DG:  Results for orders placed during the hospital encounter of 03/07/17  DG Shoulder Left   Narrative CLINICAL DATA:  56 year old female with rheumatoid arthritis. Chronic bilateral shoulder pain. Initial encounter.  EXAM: LEFT SHOULDER - 2+ VIEW  COMPARISON:  None.  FINDINGS: Minimal acromioclavicular joint degenerative changes without erosion.  No abnormal soft tissue calcifications.  No fracture or dislocation.  Visualized lungs clear.  IMPRESSION: Minimal acromioclavicular joint degenerative changes without erosion.   Electronically Signed   By: Genia Del M.D.   On: 03/07/2017 17:18    Lumbosacral Imaging: Lumbar MR wo contrast:  Results for orders placed during the hospital encounter of 09/28/16  MR LUMBAR SPINE WO CONTRAST   Narrative CLINICAL DATA:  Lumbar radiculopathy. Low back pain with left leg and buttock pain. Leg numbness and weakness. Symptoms for 2 months. Remote lumbar spine surgery.  EXAM: MRI LUMBAR SPINE WITHOUT CONTRAST  TECHNIQUE: Multiplanar, multisequence MR imaging of the lumbar spine was performed. No intravenous contrast was administered.  COMPARISON:  10/24/2014  FINDINGS: Segmentation:  Standard.  Alignment: Bilateral L4 pars defects with unchanged anterolisthesis measuring 8 mm.  Vertebrae: No evidence of acute fracture. Prominent chronic degenerative endplate  changes are again seen at L4-5 with unchanged L4 inferior endplate Schmorl's node type deformity. Mild-to-moderate diffuse bone marrow heterogeneity elsewhere is similar to the prior study and nonspecific. There is minimal degenerative endplate edema at V0-1.  Conus medullaris: Extends to the L1 level and appears normal.  Paraspinal and other soft tissues: Unremarkable aside from postoperative changes in the posterior lower lumbar soft tissues.  Disc levels:  Disc desiccation throughout the lumbar spine. Similar appearance of mild disc space narrowing at L2-3 and L3-4, severe narrowing at L4-5, and moderate narrowing at L5-S1.  L1-2:  Negative.  L2-3: New small right central disc extrusion with cephalad migration to the mid L2 vertebral body level without associated stenosis. Mild circumferential disc bulging, shallow left central disc protrusion, and moderate facet and ligamentum flavum hypertrophy also do not result in significant stenosis.  L3-4: Prior laminectomies. Central disc extrusion with mild caudal migration and facet hypertrophy result in moderate right and severe left lateral recess stenosis with potential bilateral L4 nerve root impingement, unchanged. Mild disc bulging extends into the inferior left neural foramen without significant foraminal stenosis.  L4-5: Prior laminectomies. No spinal stenosis. Low signal material around the left greater than right L5 nerve roots is unchanged and likely reflects postoperative epidural fibrosis. Listhesis with bulging uncovered disc results in mild bilateral neural foraminal stenosis, unchanged.  L5-S1: Prior laminectomies. Epidural fibrosis about the S1 nerve roots. Mild disc bulging without stenosis, unchanged.  IMPRESSION: 1. New right central disc extrusion at L2-3 without stenosis. 2. Stable postsurgical and degenerative changes elsewhere, including at L3-4 where a disc extrusion results in left greater than  right lateral recess stenosis.   Electronically Signed   By: Logan Bores M.D.   On: 09/28/2016 14:50    Lumbar MR wo contrast:  Results for orders placed in visit on 10/24/14  MR L Spine Ltd W/O Cm   Narrative * PRIOR REPORT IMPORTED FROM AN EXTERNAL SYSTEM *   CLINICAL DATA:  Low back pain. Lumbago. Initial encounter.  Claustrophobia. Restless legs. LEFT anterior leg pain. Symptoms for  3 weeks. Surgery in the 1980s.   EXAM:  MRI LUMBAR SPINE WITHOUT CONTRAST   TECHNIQUE:  Multiplanar, multisequence MR imaging of the lumbar spine was  performed. No intravenous contrast was administered.   COMPARISON:  04/13/2010 MRI.   FINDINGS:  Segmentation: The numbering convention used for this exam termed  L5-S1 as the last intervertebral disc space.   Alignment: Grade II anterolisthesis of L4 on L5 measures  9 mm. This  appears secondary to L4 pars defects. Otherwise the alignment is  within normal limits.   Vertebrae: Vertebral body height is preserved with the exception of  degenerative remodeling at L4-L5. Severe degenerative endplate  changes at G6-K5. No destructive osseous lesions. Bone marrow signal  shows heterogenous marrow. This is a nonspecific finding most  commonly associated with obesity, anemia, cigarette smoking or  chronic disease.   Conus medullaris: Normal termination at L1.   Paraspinal tissues: Normal.   Disc levels:   Disc Signal: Lower lumbar disc desiccation and degeneration.   T12-L1:  Negative.   L1-L2:  Negative.   L2-L3: Shallow broad-based disc bulging. Posterior ligamentum flavum  redundancy. Central canal and foramina patent.   L3-L4: Laminectomy. Central disc extrusion is present with caudal  migration of disc material. There is bilateral  LEFT-greater-than-RIGHT subarticular stenosis potentially affecting  the descending L4 nerves. Central canal appears adequately patent.  Both neural foramina are patent.   L4-L5: L4 laminectomy.  The central canal has been decompressed.  There is uncoverage of the disc and shallow broad-based posterior  disc bulging. Low signal around the descending L5 nerves likely  represents perineural fibrosis. Mild bilateral foraminal stenosis is  present with the stenosis mitigated by the anterolisthesis.   L5-S1: L5 laminectomy. Central canal and subarticular zones  decompressed. Both neural foramina are patent.   IMPRESSION:  1. L3 through L5 laminectomy. Evaluation degraded by lack of IV  contrast in this postsurgical patient.  2. Chronic L4 pars defects with grade II anterolisthesis. Severe  disc degeneration with mild bilateral foraminal stenosis.  3. L3-L4 central disc extrusion with caudal migration and  LEFT-greater-than-RIGHT bilateral subarticular stenosis potentially  affecting the descending LEFT L4 nerve.    Electronically Signed    By: Dereck Ligas M.D.    On: 10/24/2014 12:14       Lumbar MR w/wo contrast:  Results for orders placed during the hospital encounter of 04/13/10  MR Lumbar Spine W Wo Contrast   Narrative This examination was performed at [place] at [facility]. The interpretation will be provided by [facility].   BUN and creatinine were obtained on site.  Results:  BUN 14 mg/dL, Creatinine 0.7 mg/dL.  Provider: Sammie Bench   Lumbar DG Bending views:  Results for orders placed during the hospital encounter of 03/07/17  DG Lumbar Spine Complete W/Bend   Narrative CLINICAL DATA:  56 year old female with rheumatoid arthritis. Chronic back pain. Initial encounter.  EXAM: LUMBAR SPINE - COMPLETE WITH BENDING VIEWS  COMPARISON:  09/28/2016 lumbar spine MR.  FINDINGS: Prior surgery L3-4 through L5-S1.  Bilateral L 4 pars defects. 12.5 mm anterior slip of L4. Moderate-to-marked L4-5 disc space narrowing with endplate sclerosis and osteophyte.  Mild to moderate L5-S1 disc space narrowing. L5-S1  facet degenerative changes.  Mild L3-4 disc space narrowing and facet degenerative changes.  Mild L2-3 disc space narrowing.  Minimal motion occurs at the L3-4 level between flexion and extension.  Vascular calcifications.  IMPRESSION: Prior surgery L3-4 through L5-S1.  Bilateral L 4 pars defects. 12.5 mm anterior slip of L4. Moderate-to-marked L4-5 disc space narrowing with endplate sclerosis and osteophyte.  Mild to moderate L5-S1 disc space narrowing. L5-S1 facet degenerative changes.  Mild L3-4 disc space narrowing and facet degenerative changes.  Mild L2-3 disc space narrowing.  Minimal motion occurs at the L3-4 level between flexion and extension.   Electronically Signed   By: Alcide Evener.D.  On: 03/07/2017 17:27    Hip Imaging: Hip-R DG 2-3 views:  Results for orders placed during the hospital encounter of 01/02/17  DG HIP UNILAT WITH PELVIS 2-3 VIEWS RIGHT   Narrative CLINICAL DATA:  Felt pop in hip yesterday.  EXAM: DG HIP (WITH OR WITHOUT PELVIS) 2-3V RIGHT  COMPARISON:  None.  FINDINGS: There is no evidence of hip fracture or dislocation. There is no evidence of arthropathy or other focal bone abnormality.  IMPRESSION: Negative.   Electronically Signed   By: Rolm Baptise M.D.   On: 01/02/2017 14:05    Note: Results of ordered imaging test(s) reviewed and explained to patient in Layman's terms. Copy of results provided to patient  Meds   Current Meds  Medication Sig  . ALPRAZolam (XANAX) 0.5 MG tablet Take 0.5 mg by mouth at bedtime as needed.   Marland Kitchen aspirin 81 MG tablet Take 81 mg by mouth daily.  Marland Kitchen escitalopram (LEXAPRO) 20 MG tablet Take 20 mg by mouth daily.  Marland Kitchen gabapentin (NEURONTIN) 300 MG capsule Take 300 mg by mouth at bedtime.   . hydrochlorothiazide (HYDRODIURIL) 12.5 MG tablet Take 25 mg by mouth daily.   Marland Kitchen lidocaine (LIDODERM) 5 % Place 1 patch onto the skin every 12 (twelve) hours. Remove & Discard patch within 12 hours or  as directed by MD  . omeprazole (PRILOSEC) 20 MG capsule Take 20 mg by mouth daily.  . potassium chloride (K-DUR) 10 MEQ tablet Take 10 mEq by mouth daily.  Marland Kitchen rOPINIRole (REQUIP) 4 MG tablet Take 4 mg by mouth at bedtime. 2 tablets    ROS  Constitutional: Denies any fever or chills Gastrointestinal: No reported hemesis, hematochezia, vomiting, or acute GI distress Musculoskeletal: Denies any acute onset joint swelling, redness, loss of ROM, or weakness Neurological: No reported episodes of acute onset apraxia, aphasia, dysarthria, agnosia, amnesia, paralysis, loss of coordination, or loss of consciousness  Allergies  Ms. Cassaday is allergic to orencia [abatacept]; sulfa antibiotics; azathioprine; indocin [indomethacin]; methotrexate derivatives; remicade [infliximab]; zanaflex [tizanidine hcl]; and adalimumab.  [place]  Drug: Ms. Joy  reports that she does not use drugs. Alcohol:  reports that she does not drink alcohol. Tobacco:  reports that she has been smoking.  She has a 35.00 pack-year smoking history. She has never used smokeless tobacco. Medical:  has a past medical history of Anxiety; Chronic lower back pain; Hypertension; Restless leg; and Rheumatoid arthritis ([place]). Surgical: Ms. Guizar  has a past surgical history that includes Back surgery (1980). Family: family history includes Diabetes in her sister; Heart failure in her father; Hypertension in her father.  Constitutional Exam  General appearance: Well nourished, well developed, and well hydrated. In no apparent acute distress Vitals:   04/24/17 0851  BP: (!) 143/67  Pulse: 72  Temp: 97.6 F (36.4 C)  TempSrc: Oral  SpO2: 100%  Weight: 147 lb (66.7 kg)  Height: 5' 8" (1.727 m)   BMI Assessment: Estimated body mass index is 22.35 kg/m as calculated from the following:   Height as of this encounter: 5' 8" (1.727 m).   Weight as of this encounter: 147 lb (66.7 kg).  BMI interpretation table: BMI level Category Range  association with higher incidence of chronic pain  <18 kg/m2 Underweight   18.5-24.9 kg/m2 Ideal body weight   25-29.9 kg/m2 Overweight Increased incidence by 20%  30-34.9 kg/m2 Obese (Class I) Increased incidence by 68%  35-39.9 kg/m2 Severe obesity (Class II) Increased incidence by 136%  >40 kg/m2 Extreme  obesity (Class III) Increased incidence by 254%   BMI Readings from Last 4 Encounters:  04/24/17 22.35 kg/m  04/16/17 22.81 kg/m  03/07/17 22.05 kg/m  01/02/17 22.81 kg/m   Wt Readings from Last 4 Encounters:  04/24/17 147 lb (66.7 kg)  04/16/17 150 lb (68 kg)  03/07/17 145 lb (65.8 kg)  01/02/17 150 lb (68 kg)  Psych/Mental status: Alert, oriented x 3 (person, place, & time)       Eyes: PERLA Respiratory: No evidence of acute respiratory distress  Cervical Spine Area Exam  Skin & Axial Inspection: No masses, redness, edema, swelling, or associated skin lesions Alignment: Symmetrical Functional ROM: Unrestricted ROM      Stability: No instability detected Muscle Tone/Strength: Functionally intact. No obvious neuro-muscular anomalies detected. Sensory (Neurological): Unimpaired Palpation: No palpable anomalies              Upper Extremity (UE) Exam    Side: Right upper extremity  Side: Left upper extremity  Skin & Extremity Inspection: Skin color, temperature, and hair growth are WNL. No peripheral edema or cyanosis. No masses, redness, swelling, asymmetry, or associated skin lesions. No contractures.  Skin & Extremity Inspection: Skin color, temperature, and hair growth are WNL. No peripheral edema or cyanosis. No masses, redness, swelling, asymmetry, or associated skin lesions. No contractures.  Functional ROM: Unrestricted ROM          Functional ROM: Unrestricted ROM          Muscle Tone/Strength: Functionally intact. No obvious neuro-muscular anomalies detected.  Muscle Tone/Strength: Functionally intact. No obvious neuro-muscular anomalies detected.  Sensory  (Neurological): Unimpaired          Sensory (Neurological): Unimpaired          Palpation: No palpable anomalies              Palpation: No palpable anomalies              Specialized Test(s): Deferred         Specialized Test(s): Deferred          Thoracic Spine Area Exam  Skin & Axial Inspection: No masses, redness, or swelling Alignment: Symmetrical Functional ROM: Unrestricted ROM Stability: No instability detected Muscle Tone/Strength: Functionally intact. No obvious neuro-muscular anomalies detected. Sensory (Neurological): Unimpaired Muscle strength & Tone: No palpable anomalies  Lumbar Spine Area Exam  Skin & Axial Inspection: Well healed scar from previous spine surgery detected Alignment: Symmetrical Functional ROM: Minimal ROM      Stability: No instability detected Muscle Tone/Strength: Functionally intact. No obvious neuro-muscular anomalies detected. Sensory (Neurological): Movement-associated pain Palpation: Complains of area being tender to palpation       Provocative Tests: Lumbar Hyperextension and rotation test: Positive bilaterally for facet joint pain. Lumbar Lateral bending test: evaluation deferred today       Patrick's Maneuver: evaluation deferred today                    Gait & Posture Assessment  Ambulation: Patient ambulates using a cane Gait: Very limited, using assistive device to ambulate Posture: Antalgic   Lower Extremity Exam    Side: Right lower extremity  Side: Left lower extremity  Skin & Extremity Inspection: Skin color, temperature, and hair growth are WNL. No peripheral edema or cyanosis. No masses, redness, swelling, asymmetry, or associated skin lesions. No contractures.  Skin & Extremity Inspection: Skin color, temperature, and hair growth are WNL. No peripheral edema or cyanosis. No masses, redness, swelling, asymmetry, or associated  skin lesions. No contractures.  Functional ROM: Limited ROM for hip and knee joints  Functional ROM:  Limited ROM for hip and knee joints  Muscle Tone/Strength: Functionally intact. No obvious neuro-muscular anomalies detected.  Muscle Tone/Strength: Functionally intact. No obvious neuro-muscular anomalies detected.  Sensory (Neurological): Unimpaired  Sensory (Neurological): Unimpaired  Palpation: No palpable anomalies  Palpation: No palpable anomalies   Assessment & Plan  Primary Diagnosis & Pertinent Problem List: The primary encounter diagnosis was Chronic low back pain (Primary Source of Pain) (Bilateral) (L>R). Diagnoses of Chronic lower extremity pain (Secondary source of pain) (Bilateral) (L>R), Chronic hand pain, unspecified laterality, Long term prescription benzodiazepine use, Long-term use of high-risk medication, PTSD (post-traumatic stress disorder), Attention deficit hyperactivity disorder (ADHD), unspecified ADHD type, Hypokalemia, and Vitamin D insufficiency were also pertinent to this visit.  Visit Diagnosis: 1. Chronic low back pain (Primary Source of Pain) (Bilateral) (L>R)   2. Chronic lower extremity pain (Secondary source of pain) (Bilateral) (L>R)   3. Chronic hand pain, unspecified laterality   4. Long term prescription benzodiazepine use   5. Long-term use of high-risk medication   6. PTSD (post-traumatic stress disorder)   7. Attention deficit hyperactivity disorder (ADHD), unspecified ADHD type   8. Hypokalemia   9. Vitamin D insufficiency    Problems updated and reviewed during this visit: Problem  Chronic low back pain (Primary Source of Pain) (Bilateral) (L>R)  Chronic lower extremity pain (Secondary source of pain) (Bilateral) (L>R)  Chronic hand pain (Tertiary source of pain) (Bilateral) (R>L)  Hypokalemia  Vitamin D Insufficiency   Time Note: Greater than 50% of the 40 minute(s) of face-to-face time spent with Ms. Zeigler, was spent in counseling/coordination of care regarding: Ms. Cressler primary cause of pain, the results of her recent test(s), the  significance of each one oth the test(s) anomalies and it's corresponding characteristic pain pattern(s), the treatment plan, treatment alternatives, going over the informed consent and the appropriate use of her medications.  Plan of Care  Pharmacotherapy (Medications Ordered): Meds ordered this encounter  Medications  . Calcium Carb-Cholecalciferol (CALCIUM PLUS D3 ABSORBABLE) [phone] MG-UNIT CAPS    Sig: Take 1 capsule by mouth daily with breakfast.    Dispense:  90 capsule    Refill:  0    Do not place medication on "Automatic Refill". Fill one day early if pharmacy is closed on scheduled refill date.  . Cholecalciferol 5000 units capsule    Sig: Take 1 capsule (5,000 Units total) by mouth daily.    Dispense:  90 capsule    Refill:  0    Do not place medication on "Automatic Refill". Fill one day early if pharmacy is closed on scheduled refill date.  . ergocalciferol (VITAMIN D2) 50000 units capsule    Sig: Take 1 capsule (50,000 Units total) by mouth 2 (two) times a week. X 6 weeks.    Dispense:  12 capsule    Refill:  0    Do not add this medication to the electronic "Automatic Refill" notification system. Patient may have prescription filled one day early if pharmacy is closed on scheduled refill date.   Procedure Orders    No procedure(s) ordered today    Lab Orders     Potassium Imaging Orders  No imaging studies ordered today    Referral Orders     Ambulatory referral to Psychiatry  Pharmacological management options:  Opioid Analgesics: I will not be prescribing any opioids at this time Membrane stabilizer: I will not  be prescribing any at this time Muscle relaxant: I will not be prescribing any at this time NSAID: I will not be prescribing any at this time Other analgesic(s): I will not be prescribing any at this time   Interventional management options: Planned, scheduled, and/or pending:    The patient has decided that she is not interested in interventional  therapies.    Considering:   Diagnostic bilateral lumbar facet block  Possible bilateral lumbar facet RFA  Diagnostic right L2-3 and bilateral L3-4 transforaminal epidural steroid injection  Diagnostic bilateral L4-5 transforaminal epidural steroid injection  Possible bilateral L4-5 transforaminal Racz procedure  Diagnostic bilateral S1 transforaminal epidural steroid injection  Diagnostic caudal epidural steroid injection + diagnostic epidurogram  Possible Racz procedure  Possible spinal cord stimulator trial  Possible spinal cord stimulator implant  Diagnostic right-sided cervical epidural steroid injection  Diagnostic bilateral cervical facet block  Possible bilateral cervical facet RFA  Diagnostic bilateral intra-articular shoulder joint injection  Diagnostic bilateral suprascapular nerve block  Possible bilateral suprascapular nerve RFA    PRN Procedures:   To be determined at a later time   Provider-requested follow-up: Return if symptoms worsen or fail to improve and she decides to take advantage of the therapies we have to offered.  No future appointments.  Primary Care Physician: Llc, [place] Location: [facility] Note by: Gaspar Cola, MD Date: 04/24/2017; Time: 10:49 AM

## 2017-04-24 NOTE — Patient Instructions (Signed)

## 2017-04-25 DIAGNOSIS — M791 Myalgia: Secondary | ICD-10-CM | POA: Diagnosis not present

## 2017-04-25 DIAGNOSIS — M5117 Intervertebral disc disorders with radiculopathy, lumbosacral region: Secondary | ICD-10-CM | POA: Diagnosis not present

## 2017-04-25 DIAGNOSIS — E876 Hypokalemia: Secondary | ICD-10-CM | POA: Diagnosis not present

## 2017-04-25 DIAGNOSIS — M069 Rheumatoid arthritis, unspecified: Secondary | ICD-10-CM | POA: Diagnosis not present

## 2017-05-02 ENCOUNTER — Telehealth (HOSPITAL_COMMUNITY): Payer: Self-pay

## 2017-05-03 DIAGNOSIS — M5416 Radiculopathy, lumbar region: Secondary | ICD-10-CM | POA: Insufficient documentation

## 2017-05-03 DIAGNOSIS — G629 Polyneuropathy, unspecified: Secondary | ICD-10-CM | POA: Insufficient documentation

## 2017-05-08 DIAGNOSIS — R200 Anesthesia of skin: Secondary | ICD-10-CM | POA: Insufficient documentation

## 2017-05-09 ENCOUNTER — Telehealth: Payer: Self-pay

## 2017-05-09 DIAGNOSIS — M5416 Radiculopathy, lumbar region: Secondary | ICD-10-CM | POA: Diagnosis not present

## 2017-05-09 DIAGNOSIS — M5126 Other intervertebral disc displacement, lumbar region: Secondary | ICD-10-CM | POA: Diagnosis not present

## 2017-05-09 NOTE — Progress Notes (Signed)
Results were reviewed and found to be: abnormal  No acute injury or pathology identified  

## 2017-05-09 NOTE — Telephone Encounter (Signed)
I received another referral for this patient who has seen Dr. Laban Emperor twice, stating she wants to change to another doctor. I called the patient and asked her why she wanted to change. She said she does not want to have procedures and Dr. Laban Emperor told her she couldn't get her medicine unless she had them. I feel that this may have been a misunderstanding so I told the patient I would ask Dr. Laban Emperor and let her know what he says. She said she has tried procedures before and they didn't work so she doesn't want to do that route again. She just wants him to manage her pain medicine. Please advise.

## 2017-05-09 NOTE — Progress Notes (Signed)
Results were reviewed and found to be: abnormal  Further testing may be useful  Review would suggest interventional pain management techniques may be of benefit

## 2017-05-09 NOTE — Progress Notes (Signed)
Results were reviewed and found to be: Mildly abnormal

## 2017-05-09 NOTE — Progress Notes (Signed)
Results were reviewed and found to be: mildly abnormal  No acute injury or pathology identified  

## 2017-05-09 NOTE — Progress Notes (Signed)
Results were reviewed and found to be: abnormal  Further testing may be useful  

## 2017-05-19 DIAGNOSIS — M5117 Intervertebral disc disorders with radiculopathy, lumbosacral region: Secondary | ICD-10-CM | POA: Diagnosis not present

## 2017-05-19 DIAGNOSIS — M069 Rheumatoid arthritis, unspecified: Secondary | ICD-10-CM | POA: Diagnosis not present

## 2017-05-19 DIAGNOSIS — Z0001 Encounter for general adult medical examination with abnormal findings: Secondary | ICD-10-CM | POA: Diagnosis not present

## 2017-05-23 ENCOUNTER — Telehealth: Payer: Self-pay | Admitting: *Deleted

## 2017-05-23 DIAGNOSIS — M48061 Spinal stenosis, lumbar region without neurogenic claudication: Secondary | ICD-10-CM | POA: Diagnosis not present

## 2017-05-23 DIAGNOSIS — M5416 Radiculopathy, lumbar region: Secondary | ICD-10-CM | POA: Diagnosis not present

## 2017-08-23 ENCOUNTER — Encounter: Payer: Self-pay | Admitting: Nurse Practitioner

## 2017-08-23 ENCOUNTER — Ambulatory Visit (INDEPENDENT_AMBULATORY_CARE_PROVIDER_SITE_OTHER): Payer: Medicare Other | Admitting: Nurse Practitioner

## 2017-08-23 VITALS — BP 128/79 | HR 72 | Resp 16 | Ht 68.0 in | Wt 146.8 lb

## 2017-08-23 DIAGNOSIS — R062 Wheezing: Secondary | ICD-10-CM | POA: Insufficient documentation

## 2017-08-23 DIAGNOSIS — F333 Major depressive disorder, recurrent, severe with psychotic symptoms: Secondary | ICD-10-CM | POA: Diagnosis not present

## 2017-08-23 DIAGNOSIS — F17210 Nicotine dependence, cigarettes, uncomplicated: Secondary | ICD-10-CM

## 2017-08-23 DIAGNOSIS — M791 Myalgia, unspecified site: Secondary | ICD-10-CM | POA: Diagnosis not present

## 2017-08-23 DIAGNOSIS — R251 Tremor, unspecified: Secondary | ICD-10-CM

## 2017-08-23 DIAGNOSIS — J4520 Mild intermittent asthma, uncomplicated: Secondary | ICD-10-CM | POA: Insufficient documentation

## 2017-08-23 DIAGNOSIS — I739 Peripheral vascular disease, unspecified: Secondary | ICD-10-CM | POA: Insufficient documentation

## 2017-08-23 DIAGNOSIS — B372 Candidiasis of skin and nail: Secondary | ICD-10-CM | POA: Diagnosis not present

## 2017-08-23 DIAGNOSIS — F99 Mental disorder, not otherwise specified: Secondary | ICD-10-CM

## 2017-08-23 DIAGNOSIS — F5105 Insomnia due to other mental disorder: Secondary | ICD-10-CM | POA: Insufficient documentation

## 2017-08-23 DIAGNOSIS — B353 Tinea pedis: Secondary | ICD-10-CM | POA: Insufficient documentation

## 2017-08-23 DIAGNOSIS — F411 Generalized anxiety disorder: Secondary | ICD-10-CM | POA: Diagnosis not present

## 2017-08-23 DIAGNOSIS — I959 Hypotension, unspecified: Secondary | ICD-10-CM | POA: Insufficient documentation

## 2017-08-23 DIAGNOSIS — M150 Primary generalized (osteo)arthritis: Secondary | ICD-10-CM | POA: Insufficient documentation

## 2017-08-23 DIAGNOSIS — R110 Nausea: Secondary | ICD-10-CM | POA: Insufficient documentation

## 2017-08-23 MED ORDER — CLOTRIMAZOLE-BETAMETHASONE 1-0.05 % EX CREA: 1.0000 "application " | TOPICAL_CREAM | Freq: Two times a day (BID) | CUTANEOUS | 1 refills | Status: DC

## 2017-08-23 MED ORDER — DIAZEPAM 5 MG PO TABS: 5.0000 mg | ORAL_TABLET | Freq: Two times a day (BID) | ORAL | 2 refills | Status: DC | PRN

## 2017-08-23 MED ORDER — ALPRAZOLAM 0.5 MG PO TABS: 0.5000 mg | ORAL_TABLET | Freq: Every evening | ORAL | 3 refills | Status: DC | PRN

## 2017-08-23 NOTE — Progress Notes (Signed)
Subjective:     Patient ID: Karen Chen, female   DOB: [birth year], 56 y.o.   MRN: [id number]  The patient states that she has strong odor to her urine over past few months. At her annualwellness visit, no particular infection had grown. Continue to have very strong odor to the urine. Denies pain with urination, belly, or back pain.  Also notes that in the skin fod of lower abdomen, she has odor in the skin. Smells like "dirty feet."   Current Outpatient Medications:  .  aspirin 81 MG tablet, Take 81 mg by mouth daily., Disp: , Rfl:  .  diazepam (VALIUM) 5 MG tablet, Take 1 tablet (5 mg total) by mouth 2 (two) times daily as needed., Disp: 60 tablet, Rfl: 2 .  escitalopram (LEXAPRO) 20 MG tablet, Take 20 mg by mouth daily. Take two tablets daily., Disp: , Rfl:  .  gabapentin (NEURONTIN) 300 MG capsule, Take 300 mg by mouth at bedtime. Take 3 capsules at bed time., Disp: , Rfl:  .  hydrochlorothiazide (HYDRODIURIL) 12.5 MG tablet, Take 25 mg by mouth daily. , Disp: , Rfl:  .  omeprazole (PRILOSEC) 20 MG capsule, Take 20 mg by mouth daily., Disp: , Rfl:  .  potassium chloride (K-DUR) 10 MEQ tablet, Take 10 mEq by mouth daily. Take two tablets., Disp: , Rfl:  .  PROAIR HFA 108 (90 Base) MCG/ACT inhaler, Inhale 90 mcg into the lungs 4 (four) times daily as needed. 1-2 puffs., Disp: , Rfl:  .  rOPINIRole (REQUIP) 4 MG tablet, Take 4 mg by mouth at bedtime. 2 tablets, Disp: , Rfl:  .  Calcium Carb-Cholecalciferol (CALCIUM PLUS D3 ABSORBABLE) [phone] MG-UNIT CAPS, Take 1 capsule by mouth daily with breakfast., Disp: 90 capsule, Rfl: 0 .  clotrimazole-betamethasone (LOTRISONE) cream, Apply 1 application topically 2 (two) times daily., Disp: 45 g, Rfl: 1 .  lidocaine (LIDODERM) 5 %, Place 1 patch onto the skin every 12 (twelve) hours. Remove & Discard patch within 12 hours or as directed by MD (Patient not taking: Reported on 08/23/2017), Disp: 10 patch, Rfl: 0   Review of Systems  Constitutional:  Negative.   HENT: Negative.   Respiratory: Negative.   Cardiovascular: Negative for chest pain and palpitations.  Gastrointestinal: Negative.   Endocrine: Negative.   Genitourinary: Positive for frequency.       Strong odor to urine.  Musculoskeletal: Negative.   Skin: Negative.   Allergic/Immunologic: Negative.   Neurological: Positive for tremors.       Severe restless legs. Doing very well with current dose of requip. Does make her very sleepy, so cannot drive afterward. She is resting better and legs feel better, overall.   Hematological: Negative.   Psychiatric/Behavioral: Negative.  The patient is not nervous/anxious.    Today's Vitals   08/23/17 1415  BP: 128/79  Pulse: 72  Resp: 16  SpO2: 97%  Weight: 146 lb 12.8 oz (66.6 kg)  Height: 5\' 8"  (1.727 m)      Objective:   Physical Exam  Constitutional: She is oriented to person, place, and time. She appears well-developed and well-nourished.  HENT:  Head: Normocephalic and atraumatic.  Eyes: Pupils are equal, round, and reactive to light.  Neck: Normal range of motion. Neck supple.  Cardiovascular: Normal rate and regular rhythm.  Pulmonary/Chest: Effort normal and breath sounds normal. She has no wheezes.  Abdominal: Soft. There is no tenderness.  Musculoskeletal:  The patient has generalized joint pain with point tenderness present.  There are multiple jjoint deformities present due to arthritis.   Neurological: She is alert and oriented to person, place, and time.  Skin: Skin is warm and dry.     Psychiatric: Her speech is normal. Judgment and thought content normal. Her mood appears anxious. She is agitated. Cognition and memory are normal. She exhibits a depressed mood.  Nursing note and vitals reviewed.      Assessment:     Candidal skin infection - Plan: clotrimazole-betamethasone (LOTRISONE) cream  Myalgia - Plan: diazepam (VALIUM) 5 MG tablet  Major depressive disorder, recurrent, severe with psychotic  symptoms ([facility])  Generalized anxiety disorder - Plan: diazepam (VALIUM) 5 MG tablet, DISCONTINUED: ALPRAZolam (XANAX) 0.5 MG tablet  Tremor, unspecified  Insomnia due to other mental disorder  Mild intermittent asthma, uncomplicated  Nicotine dependence, cigarettes, uncomplicated     Plan:     1. lotrisone cream should be applied to the affected areas BID for 14 days then as needed  2. Myalgia - improved with diazepam. May continue as prescribed. New rx provided today 3. Major depressive disorder - stable - continue lexapro as prescribed 4. D/c alprazolam and continue diazepam as prescribed. New rx provided today 5. Use inhalers as needed and as prescribed 6. Strongly encouraged smoking cessation.  Follow up 3 months and sooner if needed

## 2017-09-11 DIAGNOSIS — M0579 Rheumatoid arthritis with rheumatoid factor of multiple sites without organ or systems involvement: Secondary | ICD-10-CM | POA: Diagnosis not present

## 2017-09-11 DIAGNOSIS — M545 Low back pain: Secondary | ICD-10-CM | POA: Diagnosis not present

## 2017-09-11 DIAGNOSIS — R251 Tremor, unspecified: Secondary | ICD-10-CM | POA: Diagnosis not present

## 2017-09-11 DIAGNOSIS — G8929 Other chronic pain: Secondary | ICD-10-CM | POA: Diagnosis not present

## 2017-09-11 DIAGNOSIS — M810 Age-related osteoporosis without current pathological fracture: Secondary | ICD-10-CM | POA: Diagnosis not present

## 2017-09-20 DIAGNOSIS — Z79899 Other long term (current) drug therapy: Secondary | ICD-10-CM | POA: Diagnosis not present

## 2017-09-20 DIAGNOSIS — R899 Unspecified abnormal finding in specimens from other organs, systems and tissues: Secondary | ICD-10-CM | POA: Diagnosis not present

## 2017-09-20 DIAGNOSIS — M059 Rheumatoid arthritis with rheumatoid factor, unspecified: Secondary | ICD-10-CM | POA: Diagnosis not present

## 2017-09-28 DIAGNOSIS — Z79891 Long term (current) use of opiate analgesic: Secondary | ICD-10-CM | POA: Diagnosis not present

## 2017-09-28 DIAGNOSIS — M961 Postlaminectomy syndrome, not elsewhere classified: Secondary | ICD-10-CM | POA: Diagnosis not present

## 2017-09-28 DIAGNOSIS — G894 Chronic pain syndrome: Secondary | ICD-10-CM | POA: Diagnosis not present

## 2017-09-28 DIAGNOSIS — M069 Rheumatoid arthritis, unspecified: Secondary | ICD-10-CM | POA: Diagnosis not present

## 2017-09-28 DIAGNOSIS — Z79899 Other long term (current) drug therapy: Secondary | ICD-10-CM | POA: Diagnosis not present

## 2017-10-11 ENCOUNTER — Other Ambulatory Visit: Payer: Self-pay

## 2017-10-11 MED ORDER — ROPINIROLE HCL 4 MG PO TABS: 4.0000 mg | ORAL_TABLET | Freq: Every day | ORAL | 1 refills | Status: DC

## 2017-10-18 ENCOUNTER — Other Ambulatory Visit: Payer: Self-pay

## 2017-10-18 MED ORDER — GABAPENTIN 300 MG PO CAPS: ORAL_CAPSULE | ORAL | 1 refills | Status: DC

## 2017-10-26 DIAGNOSIS — M069 Rheumatoid arthritis, unspecified: Secondary | ICD-10-CM | POA: Diagnosis not present

## 2017-10-26 DIAGNOSIS — G894 Chronic pain syndrome: Secondary | ICD-10-CM | POA: Diagnosis not present

## 2017-10-26 DIAGNOSIS — Z79891 Long term (current) use of opiate analgesic: Secondary | ICD-10-CM | POA: Diagnosis not present

## 2017-10-26 DIAGNOSIS — M961 Postlaminectomy syndrome, not elsewhere classified: Secondary | ICD-10-CM | POA: Diagnosis not present

## 2017-10-26 DIAGNOSIS — Z79899 Other long term (current) drug therapy: Secondary | ICD-10-CM | POA: Diagnosis not present

## 2017-10-30 DIAGNOSIS — G8929 Other chronic pain: Secondary | ICD-10-CM | POA: Diagnosis not present

## 2017-10-30 DIAGNOSIS — M059 Rheumatoid arthritis with rheumatoid factor, unspecified: Secondary | ICD-10-CM | POA: Diagnosis not present

## 2017-10-30 DIAGNOSIS — M545 Low back pain: Secondary | ICD-10-CM | POA: Diagnosis not present

## 2017-10-30 DIAGNOSIS — M5416 Radiculopathy, lumbar region: Secondary | ICD-10-CM | POA: Diagnosis not present

## 2017-11-07 ENCOUNTER — Ambulatory Visit (INDEPENDENT_AMBULATORY_CARE_PROVIDER_SITE_OTHER): Payer: Medicare Other | Admitting: Nurse Practitioner

## 2017-11-07 ENCOUNTER — Encounter: Payer: Self-pay | Admitting: Nurse Practitioner

## 2017-11-07 VITALS — BP 130/80 | HR 82 | Resp 16 | Ht 68.0 in | Wt 152.0 lb

## 2017-11-07 DIAGNOSIS — E876 Hypokalemia: Secondary | ICD-10-CM

## 2017-11-07 DIAGNOSIS — M0579 Rheumatoid arthritis with rheumatoid factor of multiple sites without organ or systems involvement: Secondary | ICD-10-CM | POA: Diagnosis not present

## 2017-11-07 DIAGNOSIS — F333 Major depressive disorder, recurrent, severe with psychotic symptoms: Secondary | ICD-10-CM | POA: Diagnosis not present

## 2017-11-07 DIAGNOSIS — G2581 Restless legs syndrome: Secondary | ICD-10-CM

## 2017-11-07 MED ORDER — ROPINIROLE HCL 4 MG PO TABS: 4.0000 mg | ORAL_TABLET | Freq: Two times a day (BID) | ORAL | 1 refills | Status: DC

## 2017-11-07 MED ORDER — POTASSIUM CHLORIDE ER 10 MEQ PO TBCR: 20.0000 meq | EXTENDED_RELEASE_TABLET | Freq: Every day | ORAL | 1 refills | Status: AC

## 2017-11-07 MED ORDER — ESCITALOPRAM OXALATE 20 MG PO TABS: 40.0000 mg | ORAL_TABLET | Freq: Every day | ORAL | 1 refills | Status: DC

## 2017-11-07 NOTE — Progress Notes (Signed)
[facility] [address] [place], [place] [address]  Internal MEDICINE  Office Visit Note  Patient Name: Karen Chen  [id number]  [id number]  Date of Service: 11/26/2017  Chief Complaint  Patient presents with  . Osteoarthritis    The patient is here for routine follow up exam. Today, she is feeling more comfortable than she has felt in some time. She recently started seeing pain management provider recently. Has started to take pain medications as prescribed. She is concerned that medication might be too much and she would like my opinion regarding pain management nd the prescriptions given to her.    Pt is here for routine follow up.    Current Medication: Outpatient Encounter Medications as of 11/07/2017  Medication Sig  . aspirin 81 MG tablet Take 81 mg by mouth daily.  . Calcium Carb-Cholecalciferol (CALCIUM PLUS D3 ABSORBABLE) [phone] MG-UNIT CAPS Take 1 capsule by mouth daily with breakfast.  . clotrimazole-betamethasone (LOTRISONE) cream Apply 1 application topically 2 (two) times daily.  . diazepam (VALIUM) 5 MG tablet Take 1 tablet (5 mg total) by mouth 2 (two) times daily as needed.  Marland Kitchen escitalopram (LEXAPRO) 20 MG tablet Take 2 tablets (40 mg total) by mouth daily. Take two tablets daily.  Marland Kitchen gabapentin (NEURONTIN) 300 MG capsule Take 3 capsules at bed time.  . hydrochlorothiazide (HYDRODIURIL) 12.5 MG tablet Take 25 mg by mouth daily.   Marland Kitchen lidocaine (LIDODERM) 5 % Place 1 patch onto the skin every 12 (twelve) hours. Remove & Discard patch within 12 hours or as directed by MD (Patient not taking: Reported on 08/23/2017)  . omeprazole (PRILOSEC) 20 MG capsule Take 20 mg by mouth daily.  . potassium chloride (K-DUR) 10 MEQ tablet Take 2 tablets (20 mEq total) by mouth daily. Take two tablets.  Marland Kitchen PROAIR HFA 108 (90 Base) MCG/ACT inhaler Inhale 90 mcg into the lungs 4 (four) times daily as needed. 1-2 puffs.  Marland Kitchen rOPINIRole (REQUIP) 4 MG tablet Take 1 tablet (4  mg total) by mouth 2 (two) times daily. 2 tablets  . [DISCONTINUED] escitalopram (LEXAPRO) 20 MG tablet Take 20 mg by mouth daily. Take two tablets daily.  . [DISCONTINUED] potassium chloride (K-DUR) 10 MEQ tablet Take 10 mEq by mouth daily. Take two tablets.  . [DISCONTINUED] rOPINIRole (REQUIP) 4 MG tablet Take 1 tablet (4 mg total) by mouth at bedtime. 2 tablets   No facility-administered encounter medications on file as of 11/07/2017.     Surgical History: Past Surgical History:  Procedure Laterality Date  . BACK SURGERY  1980    Medical History: Past Medical History:  Diagnosis Date  . Anxiety   . Chronic lower back pain   . Hypertension   . Restless leg   . Rheumatoid arthritis (HCC)     Family History: Family History  Problem Relation Age of Onset  . Heart failure Father   . Hypertension Father   . Diabetes Sister     Social History   Socioeconomic History  . Marital status: Married    Spouse name: Not on file  . Number of children: Not on file  . Years of education: Not on file  . Highest education level: Not on file  Occupational History  . Not on file  Social Needs  . Financial resource strain: Not on file  . Food insecurity:    Worry: Not on file    Inability: Not on file  . Transportation needs:    Medical: Not on  file    Non-medical: Not on file  Tobacco Use  . Smoking status: Current Every Day Smoker    Packs/day: 1.00    Years: 35.00    Pack years: 35.00  . Smokeless tobacco: Never Used  Substance and Sexual Activity  . Alcohol use: No  . Drug use: No  . Sexual activity: Never  Lifestyle  . Physical activity:    Days per week: Not on file    Minutes per session: Not on file  . Stress: Not on file  Relationships  . Social connections:    Talks on phone: Not on file    Gets together: Not on file    Attends religious service: Not on file    Active member of club or organization: Not on file    Attends meetings of clubs or  organizations: Not on file    Relationship status: Not on file  . Intimate partner violence:    Fear of current or ex partner: Not on file    Emotionally abused: Not on file    Physically abused: Not on file    Forced sexual activity: Not on file  Other Topics Concern  . Not on file  Social History Narrative  . Not on file      Review of Systems  Constitutional: Positive for fatigue. Negative for activity change and unexpected weight change.  HENT: Negative for congestion, dental problem, postnasal drip, rhinorrhea and voice change.   Eyes: Negative.   Respiratory: Negative for shortness of breath and wheezing.   Cardiovascular: Negative for chest pain and palpitations.  Gastrointestinal: Negative for constipation, diarrhea, nausea and vomiting.  Endocrine: Negative for cold intolerance, heat intolerance, polydipsia, polyphagia and polyuria.  Genitourinary: Negative.        Strong odor to urine.  Musculoskeletal: Positive for arthralgias, back pain and myalgias.  Skin: Negative for rash.  Allergic/Immunologic: Negative.   Neurological: Positive for tremors. Negative for headaches.       Severe restless legs. Doing very well with current dose of requip. Does make her very sleepy, so cannot drive afterward. She is resting better and legs feel better, overall.   Hematological: Negative.   Psychiatric/Behavioral: Positive for dysphoric mood. The patient is nervous/anxious.     Today's Vitals   11/07/17 1515  BP: 130/80  Pulse: 82  Resp: 16  SpO2: 97%  Weight: 152 lb (68.9 kg)  Height: 5\' 8"  (1.727 m)    Physical Exam  Constitutional: She is oriented to person, place, and time. She appears well-developed and well-nourished. No distress.  HENT:  Head: Normocephalic and atraumatic.  Mouth/Throat: Oropharynx is clear and moist. No oropharyngeal exudate.  Eyes: Pupils are equal, round, and reactive to light. EOM are normal.  Neck: Normal range of motion. Neck supple. No JVD  present. No tracheal deviation present. No thyromegaly present.  Cardiovascular: Normal rate, regular rhythm and normal heart sounds. Exam reveals no gallop and no friction rub.  No murmur heard. Pulmonary/Chest: Effort normal and breath sounds normal. No respiratory distress. She has no wheezes. She has no rales. She exhibits no tenderness.  Abdominal: Soft. Bowel sounds are normal. There is no tenderness.  Musculoskeletal:  The patient has generalized joint pain with point tenderness present. There are multiple jjoint deformities present due to arthritis.   Lymphadenopathy:    She has no cervical adenopathy.  Neurological: She is alert and oriented to person, place, and time. No cranial nerve deficit.  Skin: Skin is warm and dry.  She is not diaphoretic.     Psychiatric: She has a normal mood and affect. Her speech is normal and behavior is normal. Judgment and thought content normal. Cognition and memory are normal.  Nursing note and vitals reviewed.  Assessment/Plan: 1. Rheumatoid arthritis involving multiple sites with positive rheumatoid factor [facility]) Patient recently started seeing pain management provider.  Supported decision to see pain management and asked that she take medications as needed and as prescribed. Continue to see this provider as scheduled.   2. Restless leg - rOPINIRole (REQUIP) 4 MG tablet; Take 1 tablet (4 mg total) by mouth 2 (two) times daily. 2 tablets  Dispense: 180 tablet; Refill: 1  3. Major depressive disorder, recurrent, severe with psychotic symptoms (HCC) - escitalopram (LEXAPRO) 20 MG tablet; Take 2 tablets (40 mg total) by mouth daily. Take two tablets daily.  Dispense: 180 tablet; Refill: 1  4. Hypokalemia - potassium chloride (K-DUR) 10 MEQ tablet; Take 2 tablets (20 mEq total) by mouth daily. Take two tablets.  Dispense: 180 tablet; Refill: 1  General Counseling: Alexsandra verbalizes understanding of the findings of todays visit and agrees with plan of  treatment. I have discussed any further diagnostic evaluation that may be needed or ordered today. We also reviewed her medications today. she has been encouraged to call the office with any questions or concerns that should arise related to todays visit.   This patient was seen by Vincent Gros, FNP- C in Collaboration with Dr Lyndon Code as a part of collaborative care agreement   Meds ordered this encounter  Medications  . rOPINIRole (REQUIP) 4 MG tablet    Sig: Take 1 tablet (4 mg total) by mouth 2 (two) times daily. 2 tablets    Dispense:  180 tablet    Refill:  1    Patient should be taking this twice daily.    Order Specific Question:   Supervising Provider    Answer:   Lyndon Code [[id number]]  . potassium chloride (K-DUR) 10 MEQ tablet    Sig: Take 2 tablets (20 mEq total) by mouth daily. Take two tablets.    Dispense:  180 tablet    Refill:  1    Order Specific Question:   Supervising Provider    Answer:   Lyndon Code [[id number]]  . escitalopram (LEXAPRO) 20 MG tablet    Sig: Take 2 tablets (40 mg total) by mouth daily. Take two tablets daily.    Dispense:  180 tablet    Refill:  1    Order Specific Question:   Supervising Provider    Answer:   Lyndon Code [[id number]]    Time spent: 57  Minutes          Dr Lyndon Code Internal medicine

## 2017-11-23 DIAGNOSIS — Z79899 Other long term (current) drug therapy: Secondary | ICD-10-CM | POA: Diagnosis not present

## 2017-11-23 DIAGNOSIS — Z79891 Long term (current) use of opiate analgesic: Secondary | ICD-10-CM | POA: Diagnosis not present

## 2017-11-23 DIAGNOSIS — M069 Rheumatoid arthritis, unspecified: Secondary | ICD-10-CM | POA: Diagnosis not present

## 2017-11-23 DIAGNOSIS — M961 Postlaminectomy syndrome, not elsewhere classified: Secondary | ICD-10-CM | POA: Diagnosis not present

## 2017-11-23 DIAGNOSIS — G894 Chronic pain syndrome: Secondary | ICD-10-CM | POA: Diagnosis not present

## 2017-12-13 ENCOUNTER — Telehealth: Payer: Self-pay

## 2017-12-13 NOTE — Telephone Encounter (Signed)
AS PER HEATHER DENIED VALIUM PRES PT SEES PAIN MANGEMENT I TRY TO CALL PT TO CONFIRM BUT PHONE NO IS NOT RIGHT AND OTHER PHONE NO HER HUSBAND SAYS SHE NOT AVAIALE AND HANG UP

## 2017-12-14 DIAGNOSIS — M069 Rheumatoid arthritis, unspecified: Secondary | ICD-10-CM | POA: Diagnosis not present

## 2017-12-14 DIAGNOSIS — Z79891 Long term (current) use of opiate analgesic: Secondary | ICD-10-CM | POA: Diagnosis not present

## 2017-12-14 DIAGNOSIS — G894 Chronic pain syndrome: Secondary | ICD-10-CM | POA: Diagnosis not present

## 2017-12-14 DIAGNOSIS — M961 Postlaminectomy syndrome, not elsewhere classified: Secondary | ICD-10-CM | POA: Diagnosis not present

## 2017-12-14 DIAGNOSIS — Z79899 Other long term (current) drug therapy: Secondary | ICD-10-CM | POA: Diagnosis not present

## 2017-12-20 ENCOUNTER — Ambulatory Visit (INDEPENDENT_AMBULATORY_CARE_PROVIDER_SITE_OTHER): Payer: Medicare Other | Admitting: Internal Medicine

## 2017-12-20 ENCOUNTER — Encounter: Payer: Self-pay | Admitting: Internal Medicine

## 2017-12-20 VITALS — BP 140/80 | HR 70 | Resp 16 | Ht 68.0 in | Wt 149.4 lb

## 2017-12-20 DIAGNOSIS — F333 Major depressive disorder, recurrent, severe with psychotic symptoms: Secondary | ICD-10-CM

## 2017-12-20 DIAGNOSIS — F411 Generalized anxiety disorder: Secondary | ICD-10-CM | POA: Diagnosis not present

## 2017-12-20 DIAGNOSIS — R259 Unspecified abnormal involuntary movements: Secondary | ICD-10-CM | POA: Diagnosis not present

## 2017-12-20 MED ORDER — MIRTAZAPINE 15 MG PO TABS: ORAL_TABLET | ORAL | 6 refills | Status: DC

## 2017-12-20 MED ORDER — CARBIDOPA-LEVODOPA 10-100 MG PO TABS: 1.0000 | ORAL_TABLET | Freq: Three times a day (TID) | ORAL | 3 refills | Status: DC

## 2017-12-20 NOTE — Progress Notes (Signed)
[facility] [address] [place], [place] [address]  Internal MEDICINE  Office Visit Note  Patient Name: Karen Chen  [id number]  [id number]  Date of Service: 12/29/2017  Chief Complaint  Patient presents with  . Restless legs and tremors  . Depression  Pt is here for routine follow up.   Depression         This is a chronic problem.  The current episode started more than 1 year ago.   The onset quality is undetermined.   The problem has been rapidly worsening since onset.  Associated symptoms include irritable, restlessness and indigestion.  Associated symptoms include no fatigue, no headaches and no suicidal ideas.  Risk factors include alcohol intake and illicit drug use.   Past medical history includes chronic pain.   Other  Chronicity: Pt has tremors and restless legs  Pertinent negatives include no abdominal pain, arthralgias, chest pain, chills, coughing, diaphoresis, fatigue, headaches, nausea, neck pain or vomiting. The symptoms are aggravated by walking. Treatments tried: She is on high dose requip 4 mg bid and denies taking Valium  The treatment provided no relief.   Current Medication: Outpatient Encounter Medications as of 12/20/2017  Medication Sig  . aspirin 81 MG tablet Take 81 mg by mouth daily.  . Calcium Carb-Cholecalciferol (CALCIUM PLUS D3 ABSORBABLE) [phone] MG-UNIT CAPS Take 1 capsule by mouth daily with breakfast.  . oxyCODONE-acetaminophen (PERCOCET/ROXICET) 5-325 MG tablet Take 1 tablet by mouth every 8 (eight) hours as needed.   . potassium chloride (K-DUR) 10 MEQ tablet Take 2 tablets (20 mEq total) by mouth daily. Take two tablets.  Marland Kitchen PROAIR HFA 108 (90 Base) MCG/ACT inhaler Inhale 90 mcg into the lungs 4 (four) times daily as needed. 1-2 puffs.  . [DISCONTINUED] carbidopa-levodopa (SINEMET) 10-100 MG tablet Take 1 tablet by mouth 3 (three) times daily.  . [DISCONTINUED] clotrimazole-betamethasone (LOTRISONE) cream Apply 1 application  topically 2 (two) times daily. (Patient not taking: Reported on 12/21/2017)  . [DISCONTINUED] diazepam (VALIUM) 5 MG tablet Take 1 tablet (5 mg total) by mouth 2 (two) times daily as needed. (Patient not taking: Reported on 12/20/2017)  . [DISCONTINUED] escitalopram (LEXAPRO) 20 MG tablet Take 2 tablets (40 mg total) by mouth daily. Take two tablets daily. (Patient taking differently: Take 20 mg by mouth daily. )  . [DISCONTINUED] gabapentin (NEURONTIN) 300 MG capsule Take 3 capsules at bed time. (Patient taking differently: Take 600 mg by mouth 2 (two) times daily. )  . [DISCONTINUED] hydrochlorothiazide (HYDRODIURIL) 12.5 MG tablet Take 25 mg by mouth daily.   . [DISCONTINUED] lidocaine (LIDODERM) 5 % Place 1 patch onto the skin every 12 (twelve) hours. Remove & Discard patch within 12 hours or as directed by MD (Patient not taking: Reported on 08/23/2017)  . [DISCONTINUED] mirtazapine (REMERON) 15 MG tablet Take one tab po qhs for sleep, anxiety (Patient taking differently: Take 15 mg by mouth at bedtime. )  . [DISCONTINUED] omeprazole (PRILOSEC) 20 MG capsule Take 20 mg by mouth daily.  . [DISCONTINUED] rOPINIRole (REQUIP) 4 MG tablet Take 1 tablet (4 mg total) by mouth 2 (two) times daily. 2 tablets   No facility-administered encounter medications on file as of 12/20/2017.     Surgical History: Past Surgical History:  Procedure Laterality Date  . BACK SURGERY  1980    Medical History: Past Medical History:  Diagnosis Date  . Anxiety   . Chronic lower back pain   . Hypertension   . Restless leg   .  Rheumatoid arthritis (HCC)     Family History: Family History  Problem Relation Age of Onset  . Heart failure Father   . Hypertension Father   . Diabetes Sister     Social History   Socioeconomic History  . Marital status: Married    Spouse name: Not on file  . Number of children: Not on file  . Years of education: Not on file  . Highest education level: Not on file   Occupational History  . Not on file  Social Needs  . Financial resource strain: Not on file  . Food insecurity:    Worry: Not on file    Inability: Not on file  . Transportation needs:    Medical: Not on file    Non-medical: Not on file  Tobacco Use  . Smoking status: Current Every Day Smoker    Packs/day: 1.00    Years: 35.00    Pack years: 35.00  . Smokeless tobacco: Never Used  Substance and Sexual Activity  . Alcohol use: No  . Drug use: No  . Sexual activity: Never  Lifestyle  . Physical activity:    Days per week: Not on file    Minutes per session: Not on file  . Stress: Not on file  Relationships  . Social connections:    Talks on phone: Not on file    Gets together: Not on file    Attends religious service: Not on file    Active member of club or organization: Not on file    Attends meetings of clubs or organizations: Not on file    Relationship status: Not on file  . Intimate partner violence:    Fear of current or ex partner: Not on file    Emotionally abused: Not on file    Physically abused: Not on file    Forced sexual activity: Not on file  Other Topics Concern  . Not on file  Social History Narrative  . Not on file    Review of Systems  Constitutional: Negative for chills, diaphoresis and fatigue.  HENT: Negative for ear pain, postnasal drip and sinus pressure.   Eyes: Negative for photophobia, discharge, redness, itching and visual disturbance.  Respiratory: Negative for cough, shortness of breath and wheezing.   Cardiovascular: Negative for chest pain, palpitations and leg swelling.  Gastrointestinal: Negative for abdominal pain, constipation, diarrhea, nausea and vomiting.  Genitourinary: Negative for dysuria and flank pain.  Musculoskeletal: Negative for arthralgias, back pain, gait problem and neck pain.  Skin: Negative for color change.  Allergic/Immunologic: Negative for environmental allergies and food allergies.  Neurological: Negative  for dizziness and headaches.  Hematological: Does not bruise/bleed easily.  Psychiatric/Behavioral: Positive for agitation and depression. Negative for behavioral problems (depression), hallucinations and suicidal ideas.   Vital Signs: BP 140/80 (BP Location: Left Arm, Patient Position: Sitting, Cuff Size: Normal)   Pulse 70   Resp 16   Ht 5\' 8"  (1.727 m)   Wt 149 lb 6.4 oz (67.8 kg)   LMP  (LMP Unknown)   SpO2 97%   BMI 22.72 kg/m   Physical Exam  Constitutional: She appears well-developed and well-nourished. She is irritable. No distress.  HENT:  Head: Normocephalic and atraumatic.  Mouth/Throat: Oropharynx is clear and moist. No oropharyngeal exudate.  Eyes: Pupils are equal, round, and reactive to light. EOM are normal.  Neck: Normal range of motion. Neck supple. No JVD present. No tracheal deviation present. No thyromegaly present.  Cardiovascular: Normal rate, regular  rhythm and normal heart sounds. Exam reveals no gallop and no friction rub.  No murmur heard. Pulmonary/Chest: Effort normal. No respiratory distress. She has no wheezes. She has no rales. She exhibits no tenderness.  Abdominal: Soft. Bowel sounds are normal.  Musculoskeletal: Normal range of motion.  Lymphadenopathy:    She has no cervical adenopathy.  Neurological: She is alert. No cranial nerve deficit. She exhibits abnormal muscle tone. Coordination abnormal.  Skin: Skin is warm and dry. She is not diaphoretic.  Psychiatric:  Anxious    Assessment/Plan: 1. Involuntary movements on examination - Pt is on high dose requip 4 mg bid with no relief in her symptoms, Pt was instructed to taper down, and then will start carbi/levodpa only after calling the office to see how her response was to requip taper  Pt needs to see neurology    2. Major depressive disorder, recurrent, severe with psychotic symptoms (HCC) - Increase Lexapro to 20 mg and then 30 mg qd.   3. Generalized anxiety disorder - Start a low  dose remeron, pt is not feeling comfortable about these changes, suspect psychosis, need t see psych. Unaware of other medications she is taking??   General Counseling: treina arscott understanding of the findings of todays visit and agrees with plan of treatment. I have discussed any further diagnostic evaluation that may be needed or ordered today. We also reviewed her medications today. she has been encouraged to call the office with any questions or concerns that should arise related to todays visit.   Counseling: Pt was instructed not to take sinement until seen again   Meds ordered this encounter  Medications  . DISCONTD: carbidopa-levodopa (SINEMET) 10-100 MG tablet    Sig: Take 1 tablet by mouth 3 (three) times daily.    Dispense:  90 tablet    Refill:  3  . DISCONTD: mirtazapine (REMERON) 15 MG tablet    Sig: Take one tab po qhs for sleep, anxiety    Dispense:  30 tablet    Refill:  6    Time spent:25 Minutes   Dr Lyndon Code Internal medicine

## 2017-12-21 ENCOUNTER — Emergency Department: Payer: Medicare Other

## 2017-12-21 ENCOUNTER — Inpatient Hospital Stay (HOSPITAL_COMMUNITY): Payer: Medicare Other

## 2017-12-21 ENCOUNTER — Inpatient Hospital Stay
Admission: EM | Admit: 2017-12-21 | Discharge: 2017-12-26 | DRG: 070 | Disposition: A | Discharge status: Home / Self Care | Payer: Medicare Other | Attending: Internal Medicine | Admitting: Internal Medicine

## 2017-12-21 ENCOUNTER — Other Ambulatory Visit: Payer: Self-pay

## 2017-12-21 DIAGNOSIS — Z7982 Long term (current) use of aspirin: Secondary | ICD-10-CM

## 2017-12-21 DIAGNOSIS — F17210 Nicotine dependence, cigarettes, uncomplicated: Secondary | ICD-10-CM | POA: Diagnosis present

## 2017-12-21 DIAGNOSIS — J9811 Atelectasis: Secondary | ICD-10-CM | POA: Diagnosis not present

## 2017-12-21 DIAGNOSIS — J690 Pneumonitis due to inhalation of food and vomit: Secondary | ICD-10-CM | POA: Diagnosis not present

## 2017-12-21 DIAGNOSIS — R579 Shock, unspecified: Secondary | ICD-10-CM | POA: Diagnosis present

## 2017-12-21 DIAGNOSIS — I959 Hypotension, unspecified: Secondary | ICD-10-CM | POA: Diagnosis present

## 2017-12-21 DIAGNOSIS — G9340 Encephalopathy, unspecified: Secondary | ICD-10-CM | POA: Diagnosis not present

## 2017-12-21 DIAGNOSIS — G2581 Restless legs syndrome: Secondary | ICD-10-CM | POA: Diagnosis present

## 2017-12-21 DIAGNOSIS — G9341 Metabolic encephalopathy: Principal | ICD-10-CM | POA: Diagnosis present

## 2017-12-21 DIAGNOSIS — Z794 Long term (current) use of insulin: Secondary | ICD-10-CM

## 2017-12-21 DIAGNOSIS — R509 Fever, unspecified: Secondary | ICD-10-CM | POA: Diagnosis not present

## 2017-12-21 DIAGNOSIS — M545 Low back pain: Secondary | ICD-10-CM | POA: Diagnosis present

## 2017-12-21 DIAGNOSIS — J9600 Acute respiratory failure, unspecified whether with hypoxia or hypercapnia: Secondary | ICD-10-CM | POA: Diagnosis not present

## 2017-12-21 DIAGNOSIS — R0600 Dyspnea, unspecified: Secondary | ICD-10-CM | POA: Diagnosis not present

## 2017-12-21 DIAGNOSIS — R4702 Dysphasia: Secondary | ICD-10-CM | POA: Diagnosis not present

## 2017-12-21 DIAGNOSIS — F4310 Post-traumatic stress disorder, unspecified: Secondary | ICD-10-CM | POA: Diagnosis present

## 2017-12-21 DIAGNOSIS — Z4659 Encounter for fitting and adjustment of other gastrointestinal appliance and device: Secondary | ICD-10-CM

## 2017-12-21 DIAGNOSIS — J9601 Acute respiratory failure with hypoxia: Secondary | ICD-10-CM | POA: Diagnosis present

## 2017-12-21 DIAGNOSIS — R4689 Other symptoms and signs involving appearance and behavior: Secondary | ICD-10-CM | POA: Diagnosis not present

## 2017-12-21 DIAGNOSIS — E1165 Type 2 diabetes mellitus with hyperglycemia: Secondary | ICD-10-CM | POA: Diagnosis present

## 2017-12-21 DIAGNOSIS — Z8249 Family history of ischemic heart disease and other diseases of the circulatory system: Secondary | ICD-10-CM | POA: Diagnosis not present

## 2017-12-21 DIAGNOSIS — T380X5A Adverse effect of glucocorticoids and synthetic analogues, initial encounter: Secondary | ICD-10-CM | POA: Diagnosis present

## 2017-12-21 DIAGNOSIS — Z888 Allergy status to other drugs, medicaments and biological substances status: Secondary | ICD-10-CM

## 2017-12-21 DIAGNOSIS — Z4682 Encounter for fitting and adjustment of non-vascular catheter: Secondary | ICD-10-CM | POA: Diagnosis not present

## 2017-12-21 DIAGNOSIS — F909 Attention-deficit hyperactivity disorder, unspecified type: Secondary | ICD-10-CM | POA: Diagnosis present

## 2017-12-21 DIAGNOSIS — I10 Essential (primary) hypertension: Secondary | ICD-10-CM | POA: Diagnosis not present

## 2017-12-21 DIAGNOSIS — R1310 Dysphagia, unspecified: Secondary | ICD-10-CM | POA: Diagnosis present

## 2017-12-21 DIAGNOSIS — J9382 Other air leak: Secondary | ICD-10-CM | POA: Diagnosis not present

## 2017-12-21 DIAGNOSIS — R451 Restlessness and agitation: Secondary | ICD-10-CM | POA: Diagnosis not present

## 2017-12-21 DIAGNOSIS — G8929 Other chronic pain: Secondary | ICD-10-CM | POA: Diagnosis present

## 2017-12-21 DIAGNOSIS — M069 Rheumatoid arthritis, unspecified: Secondary | ICD-10-CM | POA: Diagnosis present

## 2017-12-21 DIAGNOSIS — Z9911 Dependence on respirator [ventilator] status: Secondary | ICD-10-CM

## 2017-12-21 DIAGNOSIS — R2681 Unsteadiness on feet: Secondary | ICD-10-CM | POA: Diagnosis not present

## 2017-12-21 DIAGNOSIS — J9690 Respiratory failure, unspecified, unspecified whether with hypoxia or hypercapnia: Secondary | ICD-10-CM | POA: Diagnosis not present

## 2017-12-21 DIAGNOSIS — Z882 Allergy status to sulfonamides status: Secondary | ICD-10-CM

## 2017-12-21 DIAGNOSIS — R4020 Unspecified coma: Secondary | ICD-10-CM | POA: Diagnosis not present

## 2017-12-21 DIAGNOSIS — E876 Hypokalemia: Secondary | ICD-10-CM | POA: Diagnosis not present

## 2017-12-21 DIAGNOSIS — R000 Tachycardia, unspecified: Secondary | ICD-10-CM | POA: Diagnosis not present

## 2017-12-21 DIAGNOSIS — Z833 Family history of diabetes mellitus: Secondary | ICD-10-CM

## 2017-12-21 DIAGNOSIS — R61 Generalized hyperhidrosis: Secondary | ICD-10-CM | POA: Diagnosis not present

## 2017-12-21 DIAGNOSIS — F333 Major depressive disorder, recurrent, severe with psychotic symptoms: Secondary | ICD-10-CM

## 2017-12-21 DIAGNOSIS — F19931 Other psychoactive substance use, unspecified with withdrawal delirium: Secondary | ICD-10-CM | POA: Diagnosis not present

## 2017-12-21 LAB — MRSA PCR SCREENING: MRSA by PCR: NEGATIVE

## 2017-12-21 LAB — LACTIC ACID, PLASMA
LACTIC ACID, VENOUS: 2.1 mmol/L — AB (ref 0.5–1.9)
Lactic Acid, Venous: 2.1 mmol/L (ref 0.5–1.9)

## 2017-12-21 LAB — CBC WITH DIFFERENTIAL/PLATELET
BASOS PCT: 1 %
Basophils Absolute: 0 10*3/uL (ref 0–0.1)
EOS ABS: 0 10*3/uL (ref 0–0.7)
Eosinophils Relative: 0 %
HCT: 39.9 % (ref 35.0–47.0)
Hemoglobin: 13.5 g/dL (ref 12.0–16.0)
Lymphocytes Relative: 14 %
Lymphs Abs: 1.5 10*3/uL (ref 1.0–3.6)
MCH: 31.8 pg (ref 26.0–34.0)
MCHC: 33.9 g/dL (ref 32.0–36.0)
MCV: 93.9 fL (ref 80.0–100.0)
Monocytes Absolute: 0.4 10*3/uL (ref 0.2–0.9)
Monocytes Relative: 4 %
NEUTROS PCT: 81 %
Neutro Abs: 8.4 10*3/uL — ABNORMAL HIGH (ref 1.4–6.5)
PLATELETS: 358 10*3/uL (ref 150–440)
RBC: 4.24 MIL/uL (ref 3.80–5.20)
RDW: 13.8 % (ref 11.5–14.5)
WBC: 10.3 10*3/uL (ref 3.6–11.0)

## 2017-12-21 LAB — BLOOD GAS, ARTERIAL
ACID-BASE EXCESS: 0.7 mmol/L (ref 0.0–2.0)
BICARBONATE: 27.4 mmol/L (ref 20.0–28.0)
FIO2: 0.4
O2 Saturation: 98.6 %
PEEP/CPAP: 5 cmH2O
Patient temperature: 37
RATE: 20 resp/min
VT: 450 mL
pCO2 arterial: 52 mmHg — ABNORMAL HIGH (ref 32.0–48.0)
pH, Arterial: 7.33 — ABNORMAL LOW (ref 7.350–7.450)
pO2, Arterial: 124 mmHg — ABNORMAL HIGH (ref 83.0–108.0)

## 2017-12-21 LAB — GLUCOSE, CAPILLARY
GLUCOSE-CAPILLARY: 150 mg/dL — AB (ref 65–99)
Glucose-Capillary: 160 mg/dL — ABNORMAL HIGH (ref 65–99)
Glucose-Capillary: 141 mg/dL — ABNORMAL HIGH (ref 65–99)
Glucose-Capillary: 130 mg/dL — ABNORMAL HIGH (ref 65–99)

## 2017-12-21 LAB — URINE DRUG SCREEN, QUALITATIVE (ARMC ONLY)
Amphetamines, Ur Screen: NOT DETECTED
Barbiturates, Ur Screen: NOT DETECTED
Benzodiazepine, Ur Scrn: POSITIVE — AB
CANNABINOID 50 NG, UR ~~LOC~~: NOT DETECTED
COCAINE METABOLITE, UR ~~LOC~~: NOT DETECTED
MDMA (Ecstasy)Ur Screen: NOT DETECTED
Methadone Scn, Ur: NOT DETECTED
Opiate, Ur Screen: NOT DETECTED
PHENCYCLIDINE (PCP) UR S: NOT DETECTED
TRICYCLIC, UR SCREEN: NOT DETECTED

## 2017-12-21 LAB — URINALYSIS, COMPLETE (UACMP) WITH MICROSCOPIC
BILIRUBIN URINE: NEGATIVE
Glucose, UA: >=500 mg/dL — AB
Ketones, ur: NEGATIVE mg/dL
LEUKOCYTES UA: NEGATIVE
Nitrite: NEGATIVE
PROTEIN: NEGATIVE mg/dL
SPECIFIC GRAVITY, URINE: 1.008 (ref 1.005–1.030)
pH: 6 (ref 5.0–8.0)

## 2017-12-21 LAB — HEMOGLOBIN A1C
Hgb A1c MFr Bld: 8 % — ABNORMAL HIGH (ref 4.8–5.6)
Mean Plasma Glucose: 182.9 mg/dL

## 2017-12-21 LAB — T4, FREE: Free T4: 0.84 ng/dL (ref 0.61–1.12)

## 2017-12-21 LAB — COMPREHENSIVE METABOLIC PANEL
ALBUMIN: 3.4 g/dL — AB (ref 3.5–5.0)
ALT: 21 U/L (ref 14–54)
ANION GAP: 8 (ref 5–15)
AST: 27 U/L (ref 15–41)
Alkaline Phosphatase: 108 U/L (ref 38–126)
BUN: 22 mg/dL — ABNORMAL HIGH (ref 6–20)
CALCIUM: 8.6 mg/dL — AB (ref 8.9–10.3)
CHLORIDE: 103 mmol/L (ref 101–111)
CO2: 27 mmol/L (ref 22–32)
CREATININE: 0.86 mg/dL (ref 0.44–1.00)
GFR calc Af Amer: >60 mL/min (ref >60)
GFR calc non Af Amer: >60 mL/min (ref >60)
Glucose, Bld: 329 mg/dL — ABNORMAL HIGH (ref 65–99)
Potassium: 3.4 mmol/L — ABNORMAL LOW (ref 3.5–5.1)
SODIUM: 138 mmol/L (ref 135–145)
Total Bilirubin: 0.2 mg/dL — ABNORMAL LOW (ref 0.3–1.2)
Total Protein: 6.7 g/dL (ref 6.5–8.1)

## 2017-12-21 LAB — PROTIME-INR
INR: 0.88
PROTHROMBIN TIME: 11.9 s (ref 11.4–15.2)

## 2017-12-21 LAB — RAPID HIV SCREEN (HIV 1/2 AB+AG)
HIV 1/2 Antibodies: NONREACTIVE
HIV-1 P24 Antigen - HIV24: NONREACTIVE

## 2017-12-21 LAB — FOLATE: FOLATE: 13.1 ng/mL (ref >5.9)

## 2017-12-21 LAB — PROCALCITONIN: Procalcitonin: <0.1 ng/mL

## 2017-12-21 LAB — TSH: TSH: 4.172 u[IU]/mL (ref 0.350–4.500)

## 2017-12-21 LAB — VITAMIN B12: VITAMIN B 12: 354 pg/mL (ref 180–914)

## 2017-12-21 MED ORDER — FENTANYL CITRATE (PF) 100 MCG/2ML IJ SOLN: 100.0000 ug | INTRAMUSCULAR | Status: DC | PRN

## 2017-12-21 MED ORDER — LORAZEPAM 2 MG/ML IJ SOLN
2.0000 mg | Freq: Once | INTRAMUSCULAR | Status: AC
  Administered 2017-12-21: 2 mg via INTRAVENOUS
  Filled 2017-12-21: qty 1

## 2017-12-21 MED ORDER — SODIUM CHLORIDE 0.9 % IV SOLN: 250.0000 mL | INTRAVENOUS | Status: DC | PRN

## 2017-12-21 MED ORDER — PROPOFOL 1000 MG/100ML IV EMUL
5.0000 ug/kg/min | Freq: Once | INTRAVENOUS | Status: DC
  Filled 2017-12-21: qty 100

## 2017-12-21 MED ORDER — VANCOMYCIN HCL IN DEXTROSE 1-5 GM/200ML-% IV SOLN
1000.0000 mg | Freq: Once | INTRAVENOUS | Status: AC
  Administered 2017-12-21: 1000 mg via INTRAVENOUS

## 2017-12-21 MED ORDER — SODIUM CHLORIDE 0.9 % IV SOLN
2.0000 g | Freq: Once | INTRAVENOUS | Status: AC
  Administered 2017-12-21: 2 g via INTRAVENOUS
  Filled 2017-12-21: qty 20

## 2017-12-21 MED ORDER — SUCCINYLCHOLINE CHLORIDE 20 MG/ML IJ SOLN
140.0000 mg | Freq: Once | INTRAMUSCULAR | Status: AC
  Administered 2017-12-21: 140 mg via INTRAVENOUS

## 2017-12-21 MED ORDER — FENTANYL 2500MCG IN NS 250ML (10MCG/ML) PREMIX INFUSION
0.0000 ug/h | INTRAVENOUS | Status: DC
  Administered 2017-12-21: 25 ug/h via INTRAVENOUS
  Filled 2017-12-21: qty 250

## 2017-12-21 MED ORDER — PROPOFOL 10 MG/ML IV BOLUS
50.0000 mg | Freq: Once | INTRAVENOUS | Status: AC
  Administered 2017-12-21: 50 mg via INTRAVENOUS

## 2017-12-21 MED ORDER — PROPOFOL 10 MG/ML IV BOLUS
INTRAVENOUS | Status: AC
  Filled 2017-12-21: qty 20

## 2017-12-21 MED ORDER — PANTOPRAZOLE SODIUM 40 MG PO PACK
40.0000 mg | PACK | Freq: Every day | ORAL | Status: DC
  Administered 2017-12-21 – 2017-12-24 (×4): 40 mg
  Filled 2017-12-21 (×4): qty 20

## 2017-12-21 MED ORDER — ENOXAPARIN SODIUM 40 MG/0.4ML ~~LOC~~ SOLN
40.0000 mg | SUBCUTANEOUS | Status: DC
  Administered 2017-12-21 – 2017-12-26 (×6): 40 mg via SUBCUTANEOUS
  Filled 2017-12-21 (×6): qty 0.4

## 2017-12-21 MED ORDER — DEXTROSE 5 % IV SOLN
10.0000 mg/kg | Freq: Once | INTRAVENOUS | Status: AC
  Administered 2017-12-21: 675 mg via INTRAVENOUS
  Filled 2017-12-21: qty 13.5

## 2017-12-21 MED ORDER — DIAZEPAM 2 MG PO TABS: 2.0000 mg | ORAL_TABLET | Freq: Four times a day (QID) | ORAL | Status: DC | PRN

## 2017-12-21 MED ORDER — DEXMEDETOMIDINE HCL IN NACL 400 MCG/100ML IV SOLN
0.0000 ug/kg/h | INTRAVENOUS | Status: DC
  Administered 2017-12-21: 0.8 ug/kg/h via INTRAVENOUS
  Administered 2017-12-21: 0.6 ug/kg/h via INTRAVENOUS
  Administered 2017-12-22: 0.8 ug/kg/h via INTRAVENOUS
  Filled 2017-12-21 (×3): qty 100

## 2017-12-21 MED ORDER — KETAMINE HCL 10 MG/ML IJ SOLN
100.0000 mg | INTRAMUSCULAR | Status: AC
  Administered 2017-12-21: 100 mg via INTRAVENOUS

## 2017-12-21 MED ORDER — SODIUM CHLORIDE 0.9 % IV BOLUS
1000.0000 mL | Freq: Once | INTRAVENOUS | Status: AC
  Administered 2017-12-21: 1000 mL via INTRAVENOUS

## 2017-12-21 MED ORDER — FENTANYL CITRATE (PF) 100 MCG/2ML IJ SOLN
100.0000 ug | Freq: Once | INTRAMUSCULAR | Status: AC
  Administered 2017-12-21: 100 ug via INTRAVENOUS

## 2017-12-21 MED ORDER — ORAL CARE MOUTH RINSE
15.0000 mL | Freq: Four times a day (QID) | OROMUCOSAL | Status: DC
  Administered 2017-12-21 – 2017-12-24 (×11): 15 mL via OROMUCOSAL

## 2017-12-21 MED ORDER — NOREPINEPHRINE 4 MG/250ML-% IV SOLN
0.0000 ug/min | Freq: Once | INTRAVENOUS | Status: AC
  Administered 2017-12-21: 10 ug/min via INTRAVENOUS

## 2017-12-21 MED ORDER — POTASSIUM CHLORIDE 20 MEQ PO PACK
40.0000 meq | PACK | Freq: Two times a day (BID) | ORAL | Status: DC
  Administered 2017-12-21: 40 meq via ORAL
  Filled 2017-12-21: qty 2

## 2017-12-21 MED ORDER — DIAZEPAM 2 MG PO TABS
2.0000 mg | ORAL_TABLET | Freq: Once | ORAL | Status: AC
  Administered 2017-12-21: 2 mg
  Filled 2017-12-21: qty 1

## 2017-12-21 MED ORDER — FENTANYL CITRATE (PF) 100 MCG/2ML IJ SOLN
50.0000 ug | INTRAMUSCULAR | Status: DC | PRN
  Administered 2017-12-22 (×4): 50 ug via INTRAVENOUS
  Filled 2017-12-21 (×4): qty 2

## 2017-12-21 MED ORDER — POTASSIUM CHLORIDE 20 MEQ PO PACK
40.0000 meq | PACK | Freq: Two times a day (BID) | ORAL | Status: AC
  Administered 2017-12-21: 40 meq
  Filled 2017-12-21: qty 2

## 2017-12-21 MED ORDER — CHLORHEXIDINE GLUCONATE 0.12% ORAL RINSE (MEDLINE KIT)
15.0000 mL | Freq: Two times a day (BID) | OROMUCOSAL | Status: DC
  Administered 2017-12-21 – 2017-12-24 (×8): 15 mL via OROMUCOSAL

## 2017-12-21 MED ORDER — ALBUTEROL SULFATE (2.5 MG/3ML) 0.083% IN NEBU
2.5000 mg | INHALATION_SOLUTION | Freq: Four times a day (QID) | RESPIRATORY_TRACT | Status: DC
  Administered 2017-12-21 – 2017-12-26 (×19): 2.5 mg via RESPIRATORY_TRACT
  Filled 2017-12-21 (×19): qty 3

## 2017-12-21 MED ORDER — ACETAMINOPHEN 160 MG/5ML PO SOLN
325.0000 mg | Freq: Four times a day (QID) | ORAL | Status: DC | PRN
  Administered 2017-12-21 – 2017-12-24 (×2): 650 mg
  Filled 2017-12-21 (×4): qty 20.3

## 2017-12-21 MED ORDER — HALOPERIDOL LACTATE 5 MG/ML IJ SOLN
5.0000 mg | Freq: Once | INTRAMUSCULAR | Status: AC
  Administered 2017-12-21: 5 mg via INTRAVENOUS
  Filled 2017-12-21: qty 1

## 2017-12-21 MED ORDER — KETAMINE HCL 10 MG/ML IJ SOLN: 1.0000 mg/kg | INTRAMUSCULAR | Status: DC

## 2017-12-21 MED ORDER — NOREPINEPHRINE 4 MG/250ML-% IV SOLN
INTRAVENOUS | Status: AC
  Filled 2017-12-21: qty 250

## 2017-12-21 MED ORDER — SODIUM CHLORIDE 0.9 % IV SOLN: 100.0000 ug/h | INTRAVENOUS | Status: DC

## 2017-12-21 MED ORDER — INSULIN ASPART 100 UNIT/ML ~~LOC~~ SOLN
0.0000 [IU] | Freq: Three times a day (TID) | SUBCUTANEOUS | Status: DC
  Administered 2017-12-21: 2 [IU] via SUBCUTANEOUS
  Filled 2017-12-21: qty 1

## 2017-12-21 MED ORDER — DIAZEPAM 5 MG PO TABS
5.0000 mg | ORAL_TABLET | Freq: Two times a day (BID) | ORAL | Status: DC
  Administered 2017-12-21 – 2017-12-24 (×7): 5 mg
  Filled 2017-12-21 (×7): qty 1

## 2017-12-21 MED ORDER — PROPOFOL 1000 MG/100ML IV EMUL
5.0000 ug/kg/min | Freq: Once | INTRAVENOUS | Status: AC
Start: 2017-12-21 — End: 2017-12-21
  Administered 2017-12-21: 50 ug/kg/min via INTRAVENOUS
  Filled 2017-12-21: qty 100

## 2017-12-21 MED ORDER — ASPIRIN 81 MG PO CHEW
81.0000 mg | CHEWABLE_TABLET | Freq: Every day | ORAL | Status: DC
  Administered 2017-12-21 – 2017-12-23 (×3): 81 mg
  Filled 2017-12-21 (×4): qty 1

## 2017-12-21 MED ORDER — FENTANYL CITRATE (PF) 100 MCG/2ML IJ SOLN
200.0000 ug | Freq: Once | INTRAMUSCULAR | Status: AC
  Administered 2017-12-21: 200 ug via INTRAVENOUS

## 2017-12-21 MED ORDER — VANCOMYCIN HCL 10 G IV SOLR
1250.0000 mg | Freq: Two times a day (BID) | INTRAVENOUS | Status: DC
  Filled 2017-12-21 (×2): qty 1250

## 2017-12-21 MED ORDER — NOREPINEPHRINE 4 MG/250ML-% IV SOLN: 0.0000 ug/min | INTRAVENOUS | Status: DC

## 2017-12-21 MED ORDER — POTASSIUM CHLORIDE 2 MEQ/ML IV SOLN
INTRAVENOUS | Status: DC
  Administered 2017-12-21 – 2017-12-22 (×3): via INTRAVENOUS
  Filled 2017-12-21 (×4): qty 1000

## 2017-12-21 NOTE — H&P (Signed)
[facility] [place] [facility]   PATIENT NAME: Karen Chen    MR#:  [id number]  DATE OF BIRTH:  1961/01/13  DATE OF ADMISSION:  12/21/2017  PRIMARY CARE PHYSICIAN: Carlean Jews, NP   REQUESTING/REFERRING PHYSICIAN: Merrily Brittle, MD  CHIEF COMPLAINT:   Chief Complaint  Patient presents with  . Dysphagia    HISTORY OF PRESENT ILLNESS:  Karen Chen  is a 57 y.o. female with a known history of HTN, RA, ADHD, PTSD, anx/dep, RLS who p/w 2d Hx agitation, culminating in frank encephalopathy in ED, ultimately necessitating intubation. Pt is intubated at the time of my Hx/examination. Hx is obtained from pt's husband and sister, who are at bedside. They state that the pt has a Hx of severe Restless Leg Syndrome, and has been on multiple medications for this condition in the past. Her family believes she has been on Requip for some time now (they claim > 42yr), but they state she was recently started on a new medication, Valium, recently. She was previously on a combination of Xanax and Requip, but her Pain Clinic stopped her Xanax and changed it to Valium. It is reported that the Requip and Valium were subsequently stopped by pt's PCP ~2d ago. Since that time, it is reported that pt has been experiencing progressively worsening agitation. Pt was reportedly combative/violent in ED, hitting/kicking and generally being a danger to herself and others. Pt was reportedly twitching and having spasms in the ED. There were no reports of F/C/N/V/D/AP, CP, SOB, palpitations, diaphoresis, rigors, night sweats, HA, blurred vision, vertigo, LH, LOC, urinary symptoms. ROS otherwise unobtainable, as pt intubated. Respiratory Therapy reports thick secretions. Pt is an active smoker (> 1ppd).  PAST MEDICAL HISTORY:   Past Medical History:  Diagnosis Date  . Anxiety   . Chronic lower back pain   . Hypertension   . Restless leg   . Rheumatoid arthritis (HCC)     PAST SURGICAL  HISTORY:   Past Surgical History:  Procedure Laterality Date  . BACK SURGERY  1980    SOCIAL HISTORY:   Social History   Tobacco Use  . Smoking status: Current Every Day Smoker    Packs/day: 1.00    Years: 35.00    Pack years: 35.00  . Smokeless tobacco: Never Used  Substance Use Topics  . Alcohol use: No    FAMILY HISTORY:   Family History  Problem Relation Age of Onset  . Heart failure Father   . Hypertension Father   . Diabetes Sister     DRUG ALLERGIES:   Allergies  Allergen Reactions  . Orencia [Abatacept] Anaphylaxis  . Sulfa Antibiotics Anaphylaxis  . Azathioprine Other (See Comments)  . Indocin [Indomethacin]   . Levaquin [Levofloxacin In D5w]   . Methotrexate Derivatives   . Prozac [Fluoxetine Hcl]   . Remicade [Infliximab] Hives  . Robaxin [Methocarbamol]   . Tizanidine   . Zanaflex [Tizanidine Hcl] Other (See Comments)    Patient states a burning sensation in her mouth 30 minutes after taking.  . Adalimumab Itching and Rash    REVIEW OF SYSTEMS:   Review of Systems  Unable to perform ROS: Intubated   ROS as per HPI, otherwise unobtainable.  MEDICATIONS AT HOME:   Prior to Admission medications   Medication Sig Start Date End Date Taking? Authorizing Provider  aspirin 81 MG tablet Take 81 mg by mouth daily.    [provider]  Calcium Carb-Cholecalciferol (CALCIUM PLUS D3  ABSORBABLE) [phone] MG-UNIT CAPS Take 1 capsule by mouth daily with breakfast. 04/24/17 07/23/17  Delano Metz, MD  carbidopa-levodopa (SINEMET) 10-100 MG tablet Take 1 tablet by mouth 3 (three) times daily. 12/20/17   Lyndon Code, MD  clotrimazole-betamethasone (LOTRISONE) cream Apply 1 application topically 2 (two) times daily. 08/23/17   Carlean Jews, NP  escitalopram (LEXAPRO) 20 MG tablet Take 2 tablets (40 mg total) by mouth daily. Take two tablets daily. 11/07/17   Carlean Jews, NP  gabapentin (NEURONTIN) 300 MG capsule Take 3 capsules at bed  time. 10/18/17   Carlean Jews, NP  hydrochlorothiazide (HYDRODIURIL) 12.5 MG tablet Take 25 mg by mouth daily.     [provider]  lidocaine (LIDODERM) 5 % Place 1 patch onto the skin every 12 (twelve) hours. Remove & Discard patch within 12 hours or as directed by MD Patient not taking: Reported on 08/23/2017 04/16/17 04/16/18  Rebecka Apley, MD  mirtazapine (REMERON) 15 MG tablet Take one tab po qhs for sleep, anxiety 12/20/17   Lyndon Code, MD  omeprazole (PRILOSEC) 20 MG capsule Take 20 mg by mouth daily.    [provider]  oxyCODONE-acetaminophen (PERCOCET/ROXICET) 5-325 MG tablet Take by mouth.    [provider]  potassium chloride (K-DUR) 10 MEQ tablet Take 2 tablets (20 mEq total) by mouth daily. Take two tablets. 11/07/17   Carlean Jews, NP  PROAIR HFA 108 (90 Base) MCG/ACT inhaler Inhale 90 mcg into the lungs 4 (four) times daily as needed. 1-2 puffs. 07/19/17   [provider]  rOPINIRole (REQUIP) 4 MG tablet Take 1 tablet (4 mg total) by mouth 2 (two) times daily. 2 tablets 11/07/17 12/20/17  Carlean Jews, NP      VITAL SIGNS:  Blood pressure 138/80, pulse 70, temperature 99.8 F (37.7 C), temperature source Rectal, resp. rate (!) 21, height 5\' 8"  (1.727 m), weight 67.6 kg (149 lb), SpO2 98 %.  PHYSICAL EXAMINATION:  Physical Exam  Constitutional: She appears well-developed. She is sleeping. No distress. She is sedated and intubated.  HENT:  Head: Normocephalic and atraumatic.  Eyes: Conjunctivae are normal. No scleral icterus.  Neck: Neck supple. No tracheal deviation present. No thyromegaly present.  Cardiovascular: Normal rate, regular rhythm, S1 normal, S2 normal, normal heart sounds and intact distal pulses. Exam reveals no gallop, no S3, no S4, no distant heart sounds and no friction rub.  No murmur heard. Pulmonary/Chest: Effort normal. No stridor. She is intubated. No respiratory distress. She has no wheezes.  (+)  coarse breath sounds diffusely B/L  Abdominal: Soft. Bowel sounds are normal. She exhibits no distension. There is no tenderness. There is no rebound and no guarding.  Musculoskeletal: She exhibits no edema.  Lymphadenopathy:    She has no cervical adenopathy.  Neurological: She is unresponsive.  Skin: Skin is warm and dry. No rash noted. She is not diaphoretic. No erythema.  Psychiatric: Her mood appears anxious. She is agitated, aggressive and combative.    LABORATORY PANEL:   CBC Recent Labs  Lab 12/20/17 0155  WBC 10.3  HGB 13.5  HCT 39.9  PLT 358   ------------------------------------------------------------------------------------------------------------------  Chemistries  Recent Labs  Lab 12/20/17 0155  NA 138  K 3.4*  CL 103  CO2 27  GLUCOSE 329*  BUN 22*  CREATININE 0.86  CALCIUM 8.6*  AST 27  ALT 21  ALKPHOS 108  BILITOT 0.2*   ------------------------------------------------------------------------------------------------------------------  Cardiac Enzymes No results for input(s): TROPONINI  in the last 168 hours. ------------------------------------------------------------------------------------------------------------------  RADIOLOGY:  Ct Head Wo Contrast  Result Date: 12/21/2017 CLINICAL DATA:  Dysphagia.  Altered level of consciousness. EXAM: CT HEAD WITHOUT CONTRAST TECHNIQUE: Contiguous axial images were obtained from the base of the skull through the vertex without intravenous contrast. COMPARISON:  MRI of the head September 16, 2016 FINDINGS: BRAIN: No intraparenchymal hemorrhage, mass effect nor midline shift. The ventricles and sulci are normal for age. Patchy supratentorial white matter hypodensities. No acute large vascular territory infarcts. No abnormal extra-axial fluid collections. Basal cisterns are patent. VASCULAR: Mild calcific atherosclerosis of the carotid siphons. SKULL: No skull fracture. Severe LEFT temporomandibular osteoarthrosis.  No significant scalp soft tissue swelling. SINUSES/ORBITS: Chronic RIGHT sphenoid sinusitis. Mild paranasal sinus mucosal thickening with bilateral concha bullosa.The included ocular globes and orbital contents are non-suspicious. OTHER: None. IMPRESSION: 1. No acute intracranial process. 2. Mild chronic small vessel ischemic disease. Electronically Signed   By: Awilda Metro M.D.   On: 12/21/2017 04:55   Dg Chest Portable 1 View  Result Date: 12/21/2017 CLINICAL DATA:  Endotracheal tube position. EXAM: PORTABLE CHEST 1 VIEW COMPARISON:  12/21/2017 FINDINGS: Interval withdrawal of the endotracheal tube, now measuring 6.5 cm above the carina versus 4 cm previously. An enteric tube is been placed with tip in the left upper quadrant consistent with location in the upper stomach. Heart size and pulmonary vascularity are normal. Lungs are clear. No blunting of costophrenic angles. No pneumothorax. IMPRESSION: Endotracheal tube tip measuring 6.5 cm above the carina, withdrawn from 4 cm previously. Enteric tube tip in the left upper quadrant consistent with location in the stomach. Lungs are clear. Electronically Signed   By: Burman Nieves M.D.   On: 12/21/2017 06:04   Dg Chest Portable 1 View  Result Date: 12/21/2017 CLINICAL DATA:  Difficulty swallowing. Anxiety. Endotracheal tube placement. EXAM: PORTABLE CHEST 1 VIEW COMPARISON:  04/18/2011 FINDINGS: Endotracheal tube has been placed with tip measuring 4 cm above the carina. Normal inspiration. Heart size and pulmonary vascularity are normal. No airspace disease or consolidation in the lungs. No blunting of costophrenic angles. No pneumothorax. Mediastinal contours appear intact. IMPRESSION: Endotracheal tube appears in satisfactory position. No evidence of active pulmonary disease. Electronically Signed   By: Burman Nieves M.D.   On: 12/21/2017 04:17   Dg Abd Portable 1v  Result Date: 12/21/2017 CLINICAL DATA:  OG tube placement EXAM: PORTABLE  ABDOMEN - 1 VIEW COMPARISON:  None. FINDINGS: Enteric tube tip in the left upper quadrant consistent with location in the upper stomach. Visualized colon is stool filled without distention. No small bowel distention. IMPRESSION: Enteric tube tip in the left upper quadrant consistent with location in the upper stomach. Electronically Signed   By: Burman Nieves M.D.   On: 12/21/2017 06:03   IMPRESSION AND PLAN:   A/P: 15F AMS/encephalopathy.  1.) AMS/encephalopathy/agitation: Pt p/w 2d Hx worsening agitation. Encephalopathic in ED, agitated/combative, interfering with lines/devices, risk injury to self/others, intubated and sedated. Medications changed recently. I am concerned that pt's presentation is related to polypharmacy and/or withdrawal. She is on an impressive cocktail of psychotropic medications, including Sinemet, Lexapro, Neurontin, Remeron and Requip, in addition to Percocet and Valium. There is also the possibility of withdrawal seizures. Admit to ICU for comprehensive monitoring. -Intubated/sedated. Protonix. -Holding home medications (including Sinemet, Lexapro, Neurontin, Remeron, Requip, Valium, Percocet). -UTox (+) Benzodiazepines, otherwise (-). -CT head (-) acute intracranial process. -EEG pending. -TSH, B12, Folate, RPR pending. -Failed LP in ED (2/2 Hx spine surgery w/  hardware). IR consult for LP. -Lactate 2.1. U/A (-) infxn, CXR (-) active systemic infxn. Covered nonetheless. IVF. -Low suspicion for acute bacterial meningitis. IR LP pending. Covered w/ Vanc, Ceftriaxone, Acyclovir. Did not cover for Listeria. -NMDA receptor encephalitis less likely, but in DDx. -Neuro checks, seizure precautions. ICU monitoring. -Hypotensive on sedation. Levophed. Hold antihypertensives.  2.) HTN: Hold HCTZ.  3.) ADHD/PTSD/anx/dep/RLS: Holding home Sinemet, Lexapro, Neurontin, Remeron, Requip, Valium, Percocet, as above.  4.) FEN/GI: NPO, tube feeds, Protonix, IVF.  5.) DVT PPx:  Lovenox 40mg  SQ qD.  6.) Code Status: Full code.  7.) Disposition: Admission, pt expected to stay > 2 midnights.   All the records are reviewed and case discussed with ED provider. Management plans discussed with the patient, family and they are in agreement.  CODE STATUS: Full code.  TOTAL TIME TAKING CARE OF THIS PATIENT: 90 minutes.    M.D on 12/21/2017 at 6:13 AM  Between 7am to 6pm - Pager - [phone]  After 6pm go to [email or web address] - 01-26-1995  [facility]  Office  [phone]  CC: Primary care physician; [phone], NP   Note: This dictation was prepared with Dragon dictation along with smaller phrase technology. Any transcriptional errors that result from this process are unintentional.

## 2017-12-21 NOTE — ED Notes (Signed)
Patient moving extremities. MD Rifenbark informed. MD ordered Fentanyl bolus.

## 2017-12-21 NOTE — ED Notes (Signed)
Chaplin at bedside

## 2017-12-21 NOTE — ED Notes (Signed)
Admitting MD at bedside.

## 2017-12-21 NOTE — Progress Notes (Signed)
Pharmacy Antibiotic Note  Karen Chen is a 57 y.o. female admitted on 12/21/2017 with meningitis.  Pharmacy has been consulted for vancomycin dosing.  Plan: Patient received vanc 1g IV x 1  Will continue vanc 1.25g IV q12h w/ 6 hour stack Will draw vanc trough 04/27 @ 0000 prior to 4th dose.  Ke 0.0648 T1/2 12 hrs Goal trough 15 - 20 mcg/mL  Height: 5\' 8"  (172.7 cm) Weight: 149 lb (67.6 kg) IBW/kg (Calculated) : 63.9  Temp (24hrs), Avg:99 F (37.2 C), Min:98.2 F (36.8 C), Max:99.8 F (37.7 C)  Recent Labs  Lab 12/20/17 0155 12/21/17 0414  WBC 10.3  --   CREATININE 0.86  --   LATICACIDVEN  --  2.1*    Estimated Creatinine Clearance: 72.8 mL/min (by C-G formula based on SCr of 0.86 mg/dL).    Allergies  Allergen Reactions  . Orencia [Abatacept] Anaphylaxis  . Sulfa Antibiotics Anaphylaxis  . Azathioprine Other (See Comments)  . Indocin [Indomethacin]   . Levaquin [Levofloxacin In D5w]   . Methotrexate Derivatives   . Prozac [Fluoxetine Hcl]   . Remicade [Infliximab] Hives  . Robaxin [Methocarbamol]   . Tizanidine   . Zanaflex [Tizanidine Hcl] Other (See Comments)    Patient states a burning sensation in her mouth 30 minutes after taking.  . Adalimumab Itching and Rash    Thank you for allowing pharmacy to be a part of this patient's care.  12/23/17, PharmD, BCPS Clinical Pharmacist 12/21/2017

## 2017-12-21 NOTE — ED Provider Notes (Signed)
[facility] Emergency Department Provider Note  ____________________________________________   First MD Initiated Contact with Patient 12/21/17 0153     (approximate)  I have reviewed the triage vital signs and the nursing notes.   HISTORY  Chief Complaint Dysphagia  Level 5 exemption history limited by the patient's clinical condition  HPI Karen Chen is a 57 y.o. female who comes to the emergency department via EMS with difficulty swallowing and tremors for the past day or 2.  She has a complex past medical history including an unknown motor disorder which her primary care physician attributes to either restless leg syndrome versus possibly Parkinson's disease.  She has been taking requip and valium for about 10 years until she was discontinued 1 day ago.  The patient's husband is able to provide majority of the history and according to the husband the patient has felt warm to him for the past day or so.  Further history is limited by the patient's clinical condition.  Past Medical History:  Diagnosis Date  . Anxiety   . Chronic lower back pain   . Hypertension   . Restless leg   . Rheumatoid arthritis [facility])     Patient Active Problem List   Diagnosis Date Noted  . Encephalopathy 12/21/2017  . Myalgia 08/23/2017  . Tremor, unspecified 08/23/2017  . Insomnia due to other mental disorder 08/23/2017  . Hypotension, unspecified 08/23/2017  . Major depressive disorder, recurrent, severe with psychotic symptoms ([place]) 08/23/2017  . Wheezing 08/23/2017  . Tinea pedis 08/23/2017  . Mild intermittent asthma, uncomplicated 95/04/3266  . Primary generalized (osteo)arthritis 08/23/2017  . Nausea 08/23/2017  . Peripheral vascular disease ([place]) 08/23/2017  . Generalized anxiety disorder 08/23/2017  . Nicotine dependence, cigarettes, uncomplicated 12/45/8099  . Hypokalemia 04/24/2017  . Vitamin D insufficiency 04/24/2017  . Failed back surgical  syndrome 03/09/2017  . Grade II Anterolisthesis of L4 over L5 03/09/2017  . Epidural fibrosis 03/09/2017  . Chronic L4 bilateral pars defect 03/09/2017  . L2-3 and L3-4 intervertebral disc extrusion 03/09/2017  . Abnormal MRI, lumbar spine (09/28/2016) 03/09/2017  . Lumbar facet hypertrophy 03/09/2017  . Lumbar facet syndrome (Bilateral) (L>R) 03/09/2017  . Lumbar L3-4 lateral recess stenosis (Bilateral) (L>R) 03/09/2017  . Chronic pain syndrome 03/07/2017  . Long-term use of high-risk medication 03/07/2017  . Long term prescription benzodiazepine use 03/07/2017  . Chronic low back pain (Primary Source of Pain) (Bilateral) (L>R) 03/07/2017  . Chronic lower extremity pain (Secondary source of pain) (Bilateral) (L>R) 03/07/2017  . Chronic hand pain [facility] source of pain) (Bilateral) (R>L) 03/07/2017  . Chronic neck pain (Posterior) (Bilateral) (R>L) 03/07/2017  . Chronic shoulder pain (Bilateral) (R>L) 03/07/2017  . Chronic foot pain (Bilateral) (R>L) 03/07/2017  . TIA (transient ischemic attack) 09/16/2016  . CVA (cerebral vascular accident) ([place]) 09/15/2016  . Rheumatoid arthritis involving multiple sites with positive rheumatoid factor ([place]) 05/04/2016  . ADHD (attention deficit hyperactivity disorder) 03/25/2015  . Bite from insect 03/25/2015  . PTSD (post-traumatic stress disorder) 03/25/2015  . Anxiety 03/26/2014  . Hypertension 03/26/2014  . Restless leg 09/20/2013    Past Surgical History:  Procedure Laterality Date  . [place]    Prior to Admission medications   Medication Sig Start Date End Date Taking? Authorizing Provider  aspirin 81 MG tablet Take 81 mg by mouth daily.    [provider]  Calcium Carb-Cholecalciferol (CALCIUM PLUS D3 ABSORBABLE) [phone] MG-UNIT CAPS Take 1 capsule by mouth daily with breakfast.  04/24/17 07/23/17  Milinda Pointer, MD  carbidopa-levodopa (SINEMET) 10-100 MG tablet Take 1 tablet by mouth 3 (three) times daily.  12/20/17   Lavera Guise, MD  clotrimazole-betamethasone (LOTRISONE) cream Apply 1 application topically 2 (two) times daily. 08/23/17   Ronnell Freshwater, NP  escitalopram (LEXAPRO) 20 MG tablet Take 2 tablets (40 mg total) by mouth daily. Take two tablets daily. 11/07/17   Ronnell Freshwater, NP  gabapentin (NEURONTIN) 300 MG capsule Take 3 capsules at bed time. 10/18/17   Ronnell Freshwater, NP  hydrochlorothiazide (HYDRODIURIL) 12.5 MG tablet Take 25 mg by mouth daily.     [provider]  lidocaine (LIDODERM) 5 % Place 1 patch onto the skin every 12 (twelve) hours. Remove & Discard patch within 12 hours or as directed by MD Patient not taking: Reported on 08/23/2017 04/16/17 04/16/18  Loney Hering, MD  mirtazapine (REMERON) 15 MG tablet Take one tab po qhs for sleep, anxiety 12/20/17   Lavera Guise, MD  omeprazole (PRILOSEC) 20 MG capsule Take 20 mg by mouth daily.    [provider]  oxyCODONE-acetaminophen (PERCOCET/ROXICET) 5-325 MG tablet Take by mouth.    [provider]  potassium chloride (K-DUR) 10 MEQ tablet Take 2 tablets (20 mEq total) by mouth daily. Take two tablets. 11/07/17   Ronnell Freshwater, NP  PROAIR HFA 108 (90 Base) MCG/ACT inhaler Inhale 90 mcg into the lungs 4 (four) times daily as needed. 1-2 puffs. 07/19/17   [provider]  rOPINIRole (REQUIP) 4 MG tablet Take 1 tablet (4 mg total) by mouth 2 (two) times daily. 2 tablets 11/07/17 12/20/17  Ronnell Freshwater, NP    Allergies Orencia [abatacept]; Sulfa antibiotics; Azathioprine; Indocin [indomethacin]; Levaquin [levofloxacin in d5w]; Methotrexate derivatives; Prozac [fluoxetine hcl]; Remicade [infliximab]; Robaxin [methocarbamol]; Tizanidine; Zanaflex [tizanidine hcl]; and Adalimumab  Family History  Problem Relation Age of Onset  . Heart failure Father   . Hypertension Father   . Diabetes Sister     Social History Social History   Tobacco Use  . Smoking status: Current Every  Day Smoker    Packs/day: 1.00    Years: 35.00    Pack years: 35.00  . Smokeless tobacco: Never Used  Substance Use Topics  . Alcohol use: No  . Drug use: No    Review of Systems History limited by the patient's clinical condition  ____________________________________________   PHYSICAL EXAM:  VITAL SIGNS: ED Triage Vitals  Enc Vitals Group     BP 12/21/17 0150 (!) 144/71     Pulse Rate 12/21/17 0150 100     Resp 12/21/17 0150 20     Temp 12/21/17 0150 98.2 F (36.8 C)     Temp Source 12/21/17 0150 Oral     SpO2 12/21/17 0150 99 %     Weight 12/21/17 0149 149 lb (67.6 kg)     Height --      Head Circumference --      Peak Flow --      Pain Score 12/21/17 0149 0     Pain Loc --      Pain Edu? --      Excl. in [place]? --     Constitutional: Agitated.  Tremulous fidgeting pacing around the room appears uncomfortable Eyes: PERRL EOMI. midrange Head: Atraumatic. Nose: No congestion/rhinnorhea. Mouth/Throat: No trismus Neck: No stridor.   Cardiovascular: Tachycardic rate, regular rhythm. Grossly normal heart sounds.  Good peripheral circulation. Respiratory: Increased respiratory effort.  No  retractions. Lungs CTAB and moving good air Gastrointestinal: Soft nontender Musculoskeletal: No lower extremity edema   Neurologic: Moves all 4.  Quite tremulous Skin: Diaphoretic Psychiatric: Anxious appearing    ____________________________________________   DIFFERENTIAL includes but not limited to  Benzodiazepine withdrawal, thyrotoxicosis, sepsis, intracerebral hemorrhage ____________________________________________   LABS (all labs ordered are listed, but only abnormal results are displayed)  Labs Reviewed  CBC WITH DIFFERENTIAL/PLATELET - Abnormal; Notable for the following components:      Result Value   Neutro Abs 8.4 (*)    All other components within normal limits  COMPREHENSIVE METABOLIC PANEL - Abnormal; Notable for the following components:   Potassium 3.4  (*)    Glucose, Bld 329 (*)    BUN 22 (*)    Calcium 8.6 (*)    Albumin 3.4 (*)    Total Bilirubin 0.2 (*)    All other components within normal limits  URINALYSIS, COMPLETE (UACMP) WITH MICROSCOPIC - Abnormal; Notable for the following components:   Color, Urine YELLOW (*)    APPearance CLEAR (*)    Glucose, UA >=500 (*)    Hgb urine dipstick SMALL (*)    Bacteria, UA RARE (*)    All other components within normal limits  LACTIC ACID, PLASMA - Abnormal; Notable for the following components:   Lactic Acid, Venous 2.1 (*)    All other components within normal limits  BLOOD GAS, ARTERIAL - Abnormal; Notable for the following components:   pH, Arterial 7.33 (*)    pCO2 arterial 52 (*)    pO2, Arterial 124 (*)    All other components within normal limits  URINE DRUG SCREEN, QUALITATIVE ([facility] ONLY) - Abnormal; Notable for the following components:   Benzodiazepine, Ur Scrn POSITIVE (*)    All other components within normal limits  CULTURE, BLOOD (ROUTINE X 2)  CULTURE, BLOOD (ROUTINE X 2)  CSF CULTURE  GRAM STAIN  CULTURE, RESPIRATORY (NON-EXPECTORATED)  PROTIME-INR  TSH  T4, FREE  RAPID HIV SCREEN (HIV 1/2 AB+AG)  CSF CELL COUNT WITH DIFFERENTIAL  CSF CELL COUNT WITH DIFFERENTIAL  PROTEIN AND GLUCOSE, CSF  LACTIC ACID, PLASMA  VITAMIN B12  FOLATE  RPR  HEMOGLOBIN A1C    Lab work reviewed by me with a number of abnormalities most notably elevated lactic acid which is nonspecific and could be secondary to sepsis versus under resuscitation __________________________________________  EKG   ____________________________________________  RADIOLOGY  Head CT reviewed by me with no acute disease First chest x-ray reviewed by me with ET tube in good position ____________________________________________   PROCEDURES  Procedure(s) performed: Yes  Procedure Name: Intubation Date/Time: 12/21/2017 4:33 AM Performed by: Darel Hong, MD Pre-anesthesia Checklist: Patient  identified, Patient being monitored, Emergency Drugs available, Timeout performed and Suction available Oxygen Delivery Method: Non-rebreather mask Preoxygenation: Pre-oxygenation with 100% oxygen Induction Type: Rapid sequence Ventilation: Mask ventilation without difficulty Laryngoscope Size: Mac and 4 Grade View: Grade I Tube size: 7.5 mm Number of attempts: 1 Placement Confirmation: ETT inserted through vocal cords under direct vision,  CO2 detector and Breath sounds checked- equal and bilateral Secured at: 21 cm Tube secured with: ETT holder    .Critical Care Performed by: Darel Hong, MD Authorized by: Darel Hong, MD   Critical care provider statement:    Critical care time (minutes):  90   Critical care time was exclusive of:  Separately billable procedures and treating other patients   Critical care was necessary to treat or prevent imminent or life-threatening deterioration of  the following conditions:  Toxidrome and CNS failure or compromise   Critical care was time spent personally by me on the following activities:  Development of treatment plan with patient or surrogate, discussions with consultants, evaluation of patient's response to treatment, examination of patient, obtaining history from patient or surrogate, ordering and performing treatments and interventions, ordering and review of laboratory studies, ordering and review of radiographic studies, pulse oximetry, re-evaluation of patient's condition and review of old charts .Lumbar Puncture Date/Time: 12/21/2017 4:33 AM Performed by: Darel Hong, MD Authorized by: Darel Hong, MD   Consent:    Consent obtained:  Written and verbal   Consent given by:  Spouse   Risks discussed:  Headache, bleeding, infection, pain and nerve damage Pre-procedure details:    Procedure purpose:  Diagnostic   Preparation: Patient was prepped and draped in usual sterile fashion   Anesthesia (see MAR for exact dosages):     Anesthesia method:  Local infiltration   Local anesthetic:  Lidocaine 1% w/o epi Procedure details:    Lumbar space:  L3-L4 interspace   Patient position:  R lateral decubitus   Needle gauge:  20   Needle type:  Spinal needle - Quincke tip   Needle length (in):  3.5   Number of attempts:  5 or more Post-procedure:    Puncture site:  Adhesive bandage applied and direct pressure applied   Patient tolerance of procedure:  Tolerated well, no immediate complications Comments:     I was unable to obtain CSF and aborted the procedure    Critical Care performed: Yes  Observation: no ____________________________________________   INITIAL IMPRESSION / ASSESSMENT AND PLAN / ED COURSE  Pertinent labs & imaging results that were available during my care of the patient were reviewed by me and considered in my medical decision making (see chart for details).  The patient arrives anxious appearing and fidgeting.  Her symptoms could be consistent with benzodiazepine withdrawal so I will start with 2 mg of Ativan and reevaluate      _----------------------------------------- 3:10 AM on 12/21/2017 -----------------------------------------  Patient has increasing agitation.  Pacing around the room and appears anxious.  2 more milligrams of Ativan in addition to 5 mg of Haldol pending now and I will check general labs.  ___________________________________________   After haloperidol and Ativan the patient became diaphoretic and increasingly encephalopathic.  She felt warm to the touch.  Unclear etiology of her symptoms.  Her husband was at bedside and confirmed that this was abnormal for her.  I had a high clinical suspicion for intracerebral pathology so with the husband's consent decision was made to intubate the patient to facilitate a medical work-up.  Patient was subsequently intubated with ketamine and succinylcholine without complication.  I then covered her with ceftriaxone,  vancomycin, and acyclovir for possible meningitis.  Taken to the CT scanner which fortunately was negative.  I attempted multiple times to perform a lumbar puncture without success in the procedure was aborted.  The patient proved to be difficult to sedate and when the propofol was up her blood pressure was soft requiring vasopressors.  At this point the patient requires inpatient admission for continued work-up of her undifferentiated shock as well as treatment of her encephalopathy.  I discussed with the hospitalist who has graciously agreed to admit the patient to his service and has come personally seen and evaluated the patient himself.  FINAL CLINICAL IMPRESSION(S) / ED DIAGNOSES  Final diagnoses:  Encephalopathy  Shock ([place])  NEW MEDICATIONS STARTED DURING THIS VISIT:  New Prescriptions   No medications on file     Note:  This document was prepared using Dragon voice recognition software and may include unintentional dictation errors.     Darel Hong, MD 12/21/17 [phone]

## 2017-12-21 NOTE — Progress Notes (Signed)
   12/21/17 1120  Clinical Encounter Type  Visited With Family  Visit Type Follow-up  Spiritual Encounters  Spiritual Needs Emotional (ra[place]ort building)   Chaplain made introductory visit based on morning chaplain report, patient in bed, not engaged in conversation.  Chaplain spoke with patient spouse.  Spouse spoke of recent pastor visit, that he's know the pastor more than 30 years and that he is 'his best friend.'  Spouse stated that he and patient have been together 40 years.  Chaplain asked spouse how they had met.  Spouse became tearful and said he was too emotional to be able to tell it well. Conversation around spouse's work history as a Theme park manager.  Chaplain reviewed ongoing chaplain support and encouraged spouse to have chaplain paged as needed.  Spouse expressed openness to ongoing chaplain follow up.

## 2017-12-21 NOTE — Progress Notes (Signed)
RT assisted with patient transport to ICU 19 from ER while on the trilogy ventilator with no complications noted.

## 2017-12-21 NOTE — ED Notes (Signed)
RT at bedside.

## 2017-12-21 NOTE — Progress Notes (Signed)
[employer] [facility] [place] [facility]   PATIENT NAME: Karen Chen    MR#:  [id number]  DATE OF BIRTH:  [birth year]  SUBJECTIVE:   Pt. Here due to AMS and suspected due to withdrawal from Benzo's Likely Valium.  Pt. Intubated due to airway protection and now remains intubated & sedated.    REVIEW OF SYSTEMS:    Review of Systems  Unable to perform ROS: Intubated    Nutrition: tube feeds Tolerating Diet: Yes  Tolerating PT: Await Eval.    DRUG ALLERGIES:   Allergies  Allergen Reactions  . Orencia [Abatacept] Anaphylaxis  . Sulfa Antibiotics Anaphylaxis  . Azathioprine Other (See Comments)  . Indocin [Indomethacin]   . Levaquin [Levofloxacin In D5w]   . Methotrexate Derivatives   . Prozac [Fluoxetine Hcl]   . Remicade [Infliximab] Hives  . Robaxin [Methocarbamol]   . Tizanidine   . Zanaflex [Tizanidine Hcl] Other (See Comments)    Patient states a burning sensation in her mouth 30 minutes after taking.  . Adalimumab Itching and Rash    VITALS:  Blood pressure (!) 151/90, pulse 82, temperature 99.9 F (37.7 C), temperature source Bladder, resp. rate (!) 22, height 5\' 6"  (1.676 m), weight 70.3 kg (154 lb 15.7 oz), SpO2 95 %.  PHYSICAL EXAMINATION:   Physical Exam  GENERAL:  57 y.o.-year-old patient lying in bed sedated & Intubated.  EYES: Pupils equal, round, reactive to light. No scleral icterus.  HEENT: Head atraumatic, normocephalic.  ET and OG tubes in place. NECK:  Supple, no jugular venous distention. No thyroid enlargement, no tenderness.  LUNGS: Normal breath sounds bilaterally, no wheezing, rales, rhonchi. No use of accessory muscles of respiration.  CARDIOVASCULAR: S1, S2 normal. No murmurs, rubs, or gallops.  ABDOMEN: Soft, nontender, nondistended. Bowel sounds present. No organomegaly or mass.  EXTREMITIES: No cyanosis, clubbing or edema b/l.    NEUROLOGIC: Sedated and intubated PSYCHIATRIC: Sedated and intubated SKIN: No obvious rash,  lesion, or ulcer.    LABORATORY PANEL:   CBC Recent Labs  Lab 12/20/17 0155  WBC 10.3  HGB 13.5  HCT 39.9  PLT 358   ------------------------------------------------------------------------------------------------------------------  Chemistries  Recent Labs  Lab 12/20/17 0155  NA 138  K 3.4*  CL 103  CO2 27  GLUCOSE 329*  BUN 22*  CREATININE 0.86  CALCIUM 8.6*  AST 27  ALT 21  ALKPHOS 108  BILITOT 0.2*   ------------------------------------------------------------------------------------------------------------------  Cardiac Enzymes No results for input(s): TROPONINI in the last 168 hours. ------------------------------------------------------------------------------------------------------------------  RADIOLOGY:  Ct Head Wo Contrast  Result Date: 12/21/2017 CLINICAL DATA:  Dysphagia.  Altered level of consciousness. EXAM: CT HEAD WITHOUT CONTRAST TECHNIQUE: Contiguous axial images were obtained from the base of the skull through the vertex without intravenous contrast. COMPARISON:  MRI of the head September 16, 2016 FINDINGS: BRAIN: No intraparenchymal hemorrhage, mass effect nor midline shift. The ventricles and sulci are normal for age. Patchy supratentorial white matter hypodensities. No acute large vascular territory infarcts. No abnormal extra-axial fluid collections. Basal cisterns are patent. VASCULAR: Mild calcific atherosclerosis of the carotid siphons. SKULL: No skull fracture. Severe LEFT temporomandibular osteoarthrosis. No significant scalp soft tissue swelling. SINUSES/ORBITS: Chronic RIGHT sphenoid sinusitis. Mild paranasal sinus mucosal thickening with bilateral concha bullosa.The included ocular globes and orbital contents are non-suspicious. OTHER: None. IMPRESSION: 1. No acute intracranial process. 2. Mild chronic small vessel ischemic disease. Electronically Signed   By: Awilda Metro M.D.   On: 12/21/2017 04:55   Dg Chest Portable 1  View  Result  Date: 12/21/2017 CLINICAL DATA:  Endotracheal tube position. EXAM: PORTABLE CHEST 1 VIEW COMPARISON:  12/21/2017 FINDINGS: Interval withdrawal of the endotracheal tube, now measuring 6.5 cm above the carina versus 4 cm previously. An enteric tube is been placed with tip in the left upper quadrant consistent with location in the upper stomach. Heart size and pulmonary vascularity are normal. Lungs are clear. No blunting of costophrenic angles. No pneumothorax. IMPRESSION: Endotracheal tube tip measuring 6.5 cm above the carina, withdrawn from 4 cm previously. Enteric tube tip in the left upper quadrant consistent with location in the stomach. Lungs are clear. Electronically Signed   By: Burman Nieves M.D.   On: 12/21/2017 06:04   Dg Chest Portable 1 View  Result Date: 12/21/2017 CLINICAL DATA:  Difficulty swallowing. Anxiety. Endotracheal tube placement. EXAM: PORTABLE CHEST 1 VIEW COMPARISON:  04/18/2011 FINDINGS: Endotracheal tube has been placed with tip measuring 4 cm above the carina. Normal inspiration. Heart size and pulmonary vascularity are normal. No airspace disease or consolidation in the lungs. No blunting of costophrenic angles. No pneumothorax. Mediastinal contours appear intact. IMPRESSION: Endotracheal tube appears in satisfactory position. No evidence of active pulmonary disease. Electronically Signed   By: Burman Nieves M.D.   On: 12/21/2017 04:17   Dg Abd Portable 1v  Result Date: 12/21/2017 CLINICAL DATA:  OG tube placement EXAM: PORTABLE ABDOMEN - 1 VIEW COMPARISON:  None. FINDINGS: Enteric tube tip in the left upper quadrant consistent with location in the upper stomach. Visualized colon is stool filled without distention. No small bowel distention. IMPRESSION: Enteric tube tip in the left upper quadrant consistent with location in the upper stomach. Electronically Signed   By: Burman Nieves M.D.   On: 12/21/2017 06:03     ASSESSMENT AND PLAN:   57 year old female with  past medical history of chronic lower back pain, hypertension, restless leg syndrome, rheumatoid arthritis, anxiety resented to the hospital due to altered mental status/encephalopathy.  1.) AMS/encephalopathy/agitation: Pt p/w 2d Hx worsening agitation. Encephalopathic in ED, agitated/combative, interfering with lines/devices, risk injury to self/others, intubated and sedated. -Suspected to be secondary to the pharmacy and suspected withdrawal from Valium.  Patient apparently stopped her Valium as per her primary care physician few days back. - Continue to hold patient's meds including ((including Sinemet, Lexapro, Neurontin, Remeron, Requip, Valium, Percocet). -CT head (-) acute intracranial process.  TSH, B12, folate are within normal range.  RPR pending. -Failed LP in ED (2/2 Hx spine surgery w/ hardware).  Likely this is not encephalitis or meningitis.  No need for further lumbar puncture.  Pt. Has been taken off abx. For now.   2.) HTN: Hold HCTZ. - BP stable.   3.) ADHD/PTSD/anx/dep/RLS: Holding home Sinemet, Lexapro, Neurontin, Remeron, Requip, Valium, Percocet, as above.  4.  Acute respiratory failure-patient was intubated for airway protection.  Discussed with intensivist and plan for starting the patient on some Precedex, low-dose benzodiazepines for possible benzodiazepine withdrawal. - If patient clinically does well possible extubation tomorrow.   All the records are reviewed and case discussed with Care Management/Social Worker. Management plans discussed with the patient, family and they are in agreement.  CODE STATUS: Full code  DVT Prophylaxis: Lovenox  TOTAL TIME TAKING CARE OF THIS PATIENT: 30 minutes.   POSSIBLE D/C unclear, DEPENDING ON CLINICAL CONDITION.   Houston Siren M.D on 12/21/2017 at 12:38 PM  Between 7am to 6pm - Pager - [phone]  After 6pm go to [email or web address] - password [email or web address]  [employer] [facility]  Office   [phone]  CC: Primary care physician; Carlean Jews, NP

## 2017-12-21 NOTE — ED Notes (Signed)
Patient began to move extremities. MD Rifenbark informed. MD ordered to bolus fentanyl.

## 2017-12-21 NOTE — ED Triage Notes (Signed)
Patient taken off requip today.

## 2017-12-21 NOTE — Consult Note (Signed)
PULMONARY / CRITICAL CARE MEDICINE   Name: Karen Chen MRN: [id number] DOB: 06-Nov-1960    ADMISSION DATE:  12/21/2017 PT PROFILE: 65 F with history of severe anxiety and restless leg syndrome, on multiple psychotropic medications who underwent some adjustments (discontinuation of Valium and Requip) the day prior to presentation.  She contacted EMS and was transported to [facility] ED in the early morning hours 4/25.  Her husband sleeps in a different room and is not certain exactly why she called EMS.  He noted that she was short of breath.  Upon arrival to the emergency department, she was hypoxemic.  Subsequently, she became increasingly agitated and failed to respond to both lorazepam and haloperidol.  Therefore, she was intubated for profound agitation and combativeness.  HISTORY OF PRESENT ILLNESS:   As above  PAST MEDICAL HISTORY :  She  has a past medical history of Anxiety, Chronic lower back pain, Hypertension, Restless leg, and Rheumatoid arthritis (HCC).  PAST SURGICAL HISTORY: She  has a past surgical history that includes Back surgery (1980).  Allergies  Allergen Reactions  . Orencia [Abatacept] Anaphylaxis  . Sulfa Antibiotics Anaphylaxis  . Azathioprine Other (See Comments)  . Indocin [Indomethacin]   . Levaquin [Levofloxacin In D5w]   . Methotrexate Derivatives   . Prozac [Fluoxetine Hcl]   . Remicade [Infliximab] Hives  . Robaxin [Methocarbamol]   . Tizanidine   . Zanaflex [Tizanidine Hcl] Other (See Comments)    Patient states a burning sensation in her mouth 30 minutes after taking.  . Adalimumab Itching and Rash    No current facility-administered medications on file prior to encounter.    Current Outpatient Medications on File Prior to Encounter  Medication Sig  . aspirin 81 MG tablet Take 81 mg by mouth daily.  . carbidopa-levodopa (SINEMET) 10-100 MG tablet Take 1 tablet by mouth 3 (three) times daily.  Marland Kitchen escitalopram (LEXAPRO) 20 MG tablet Take 2  tablets (40 mg total) by mouth daily. Take two tablets daily. (Patient taking differently: Take 20 mg by mouth daily. )  . gabapentin (NEURONTIN) 300 MG capsule Take 3 capsules at bed time. (Patient taking differently: Take 600 mg by mouth 2 (two) times daily. )  . mirtazapine (REMERON) 15 MG tablet Take one tab po qhs for sleep, anxiety (Patient taking differently: Take 15 mg by mouth at bedtime. )  . oxyCODONE-acetaminophen (PERCOCET/ROXICET) 5-325 MG tablet Take 1 tablet by mouth every 8 (eight) hours as needed.   . potassium chloride (K-DUR) 10 MEQ tablet Take 2 tablets (20 mEq total) by mouth daily. Take two tablets.  . predniSONE (DELTASONE) 5 MG tablet Take 5 mg by mouth 2 (two) times daily with a meal.  . Calcium Carb-Cholecalciferol (CALCIUM PLUS D3 ABSORBABLE) [phone] MG-UNIT CAPS Take 1 capsule by mouth daily with breakfast.  . clotrimazole-betamethasone (LOTRISONE) cream Apply 1 application topically 2 (two) times daily. (Patient not taking: Reported on 12/21/2017)  . lidocaine (LIDODERM) 5 % Place 1 patch onto the skin every 12 (twelve) hours. Remove & Discard patch within 12 hours or as directed by MD (Patient not taking: Reported on 08/23/2017)  . PROAIR HFA 108 (90 Base) MCG/ACT inhaler Inhale 90 mcg into the lungs 4 (four) times daily as needed. 1-2 puffs.  . [DISCONTINUED] rOPINIRole (REQUIP) 4 MG tablet Take 1 tablet (4 mg total) by mouth 2 (two) times daily. 2 tablets    FAMILY HISTORY:  Her indicated that her mother is alive. She indicated that her father is deceased.  She indicated that both of her sisters are alive. She indicated that all of her three brothers are alive. She indicated that both of her daughters are alive.   SOCIAL HISTORY: She  reports that she has been smoking.  She has a 35.00 pack-year smoking history. She has never used smokeless tobacco. She reports that she does not drink alcohol or use drugs.  REVIEW OF SYSTEMS:   Level 5 caveat  SUBJECTIVE:     VITAL SIGNS: BP 94/65   Pulse 65   Temp 99.5 F (37.5 C)   Resp 13   Ht 5\' 6"  (1.676 m)   Wt 154 lb 15.7 oz (70.3 kg)   LMP  (LMP Unknown)   SpO2 99%   BMI 25.01 kg/m   HEMODYNAMICS:    VENTILATOR SETTINGS: Vent Mode: PRVC FiO2 (%):  [30 %-40 %] 30 % Set Rate:  [15 bmp-20 bmp] 15 bmp Vt Set:  [450 mL] 450 mL PEEP:  [5 cmH20] 5 cmH20  INTAKE / OUTPUT: I/O last 3 completed shifts: In: 316.2 [I.V.:116.2; IV Piggyback:200] Out: 360 [Urine:360]  PHYSICAL EXAMINATION: General: Intubated, sedated, arouses to physical stimulation.  Was able to F/C on occasion but not consistently Neuro: PERRLA, EOMI, corneal reflexes intact, moves all extremities HEENT: NCAT, sclerae white Cardiovascular: Regular, no M Lungs: Clear to auscultation anteriorly Abdomen: Soft, + BS, NT Extremities: Warm, no edema Skin: No lesions noted  LABS:  BMET Recent Labs  Lab 12/20/17 0155  NA 138  K 3.4*  CL 103  CO2 27  BUN 22*  CREATININE 0.86  GLUCOSE 329*    Electrolytes Recent Labs  Lab 12/20/17 0155  CALCIUM 8.6*    CBC Recent Labs  Lab 12/20/17 0155  WBC 10.3  HGB 13.5  HCT 39.9  PLT 358    Coag's Recent Labs  Lab 12/21/17 0414  INR 0.88    Sepsis Markers Recent Labs  Lab 12/21/17 0414 12/21/17 0738 12/21/17 1424  LATICACIDVEN 2.1* 2.1*  --   PROCALCITON  --   --  <0.10    ABG Recent Labs  Lab 12/21/17 0515  PHART 7.33*  PCO2ART 52*  PO2ART 124*    Liver Enzymes Recent Labs  Lab 12/20/17 0155  AST 27  ALT 21  ALKPHOS 108  BILITOT 0.2*  ALBUMIN 3.4*    Cardiac Enzymes No results for input(s): TROPONINI, PROBNP in the last 168 hours.  Glucose Recent Labs  Lab 12/21/17 0838 12/21/17 1109 12/21/17 1708  GLUCAP 160* 150* 141*    Imaging Ct Head Wo Contrast  Result Date: 12/21/2017 CLINICAL DATA:  Dysphagia.  Altered level of consciousness. EXAM: CT HEAD WITHOUT CONTRAST TECHNIQUE: Contiguous axial images were obtained from the  base of the skull through the vertex without intravenous contrast. COMPARISON:  MRI of the head September 16, 2016 FINDINGS: BRAIN: No intraparenchymal hemorrhage, mass effect nor midline shift. The ventricles and sulci are normal for age. Patchy supratentorial white matter hypodensities. No acute large vascular territory infarcts. No abnormal extra-axial fluid collections. Basal cisterns are patent. VASCULAR: Mild calcific atherosclerosis of the carotid siphons. SKULL: No skull fracture. Severe LEFT temporomandibular osteoarthrosis. No significant scalp soft tissue swelling. SINUSES/ORBITS: Chronic RIGHT sphenoid sinusitis. Mild paranasal sinus mucosal thickening with bilateral concha bullosa.The included ocular globes and orbital contents are non-suspicious. OTHER: None. IMPRESSION: 1. No acute intracranial process. 2. Mild chronic small vessel ischemic disease. Electronically Signed   By: September 18, 2016 M.D.   On: 12/21/2017 04:55   Dg Chest Portable  1 View  Result Date: 12/21/2017 CLINICAL DATA:  Endotracheal tube position. EXAM: PORTABLE CHEST 1 VIEW COMPARISON:  12/21/2017 FINDINGS: Interval withdrawal of the endotracheal tube, now measuring 6.5 cm above the carina versus 4 cm previously. An enteric tube is been placed with tip in the left upper quadrant consistent with location in the upper stomach. Heart size and pulmonary vascularity are normal. Lungs are clear. No blunting of costophrenic angles. No pneumothorax. IMPRESSION: Endotracheal tube tip measuring 6.5 cm above the carina, withdrawn from 4 cm previously. Enteric tube tip in the left upper quadrant consistent with location in the stomach. Lungs are clear. Electronically Signed   By: Burman Nieves M.D.   On: 12/21/2017 06:04   Dg Chest Portable 1 View  Result Date: 12/21/2017 CLINICAL DATA:  Difficulty swallowing. Anxiety. Endotracheal tube placement. EXAM: PORTABLE CHEST 1 VIEW COMPARISON:  04/18/2011 FINDINGS: Endotracheal tube has been  placed with tip measuring 4 cm above the carina. Normal inspiration. Heart size and pulmonary vascularity are normal. No airspace disease or consolidation in the lungs. No blunting of costophrenic angles. No pneumothorax. Mediastinal contours appear intact. IMPRESSION: Endotracheal tube appears in satisfactory position. No evidence of active pulmonary disease. Electronically Signed   By: Burman Nieves M.D.   On: 12/21/2017 04:17   Dg Abd Portable 1v  Result Date: 12/21/2017 CLINICAL DATA:  OG tube placement EXAM: PORTABLE ABDOMEN - 1 VIEW COMPARISON:  None. FINDINGS: Enteric tube tip in the left upper quadrant consistent with location in the upper stomach. Visualized colon is stool filled without distention. No small bowel distention. IMPRESSION: Enteric tube tip in the left upper quadrant consistent with location in the upper stomach. Electronically Signed   By: Burman Nieves M.D.   On: 12/21/2017 06:03       ASSESSMENT / PLAN: Ventilator dependent respiratory failure   Intubated for combativeness Severe anxiety at baseline with recent discontinuation of Valium Acute encephalopathy Concern for benzodiazepine withdrawal syndrome Polypharmacy with multiple psychotropic medications  Concern for possible adverse reaction  -Vent settings established -Vent bundle implemented -Daily SBT as indicated -Monitor BMET intermittently -Monitor I/Os -Correct electrolytes as indicated  -Maintenance IVF -Dexmedetomidine infusion -Low-dose scheduled diazepam ordered low-dose diazepam as needed -I agree that the likelihood of bacterial meningitis is small and concur with foregoing lumbar puncture -Likely extubation in a.m. 4/26   Billy Fischer, MD PCCM service Mobile [phone] Pager [phone] 12/21/2017 7:20 PM

## 2017-12-21 NOTE — Progress Notes (Signed)
I wasted patient's remainder in fentanyl drip- left in bad.  Anette Guarneri, RN was my witness.

## 2017-12-21 NOTE — Progress Notes (Signed)
Patient's sister, Mikey Bussing, works in [employer]. Arline Asp is communicating with the family and explaining patient care. Chaplain prayed for the family, patient and staff,and offered energetic prayers for the patient. Chaplain offered to return to provide continued support as requested by the family.

## 2017-12-21 NOTE — ED Notes (Signed)
RN to bedside for rounding. Patient altered. Patient no longer able to follow instructions. Allayne Stack NT called to bedside, and this RN  Informed MD Rifenbark. MD and this RN to backside

## 2017-12-21 NOTE — ED Notes (Signed)
Patient given PO fluids per MD Rifenbark. Patient able to tolerate water with no issue. RN will continue to monitor.

## 2017-12-21 NOTE — ED Triage Notes (Signed)
Patient coming [facility] from home for difficulty swallowing. Patient has hx of anxiety and restless leg syndrome. Patient pacing in room during triage.

## 2017-12-21 NOTE — Progress Notes (Signed)
Chaplain checked in with the patient's family and received an update on the patient and how family members are holding up. Chaplain offered emotional and spiritual support, and reminded the family that chaplains are available 24/7.

## 2017-12-21 NOTE — Progress Notes (Addendum)
Initial Nutrition Assessment  DOCUMENTATION CODES:   Not applicable  INTERVENTION:   Expected extubation in 24-48 hours.   If patient is still intubated in 24-48 hours, recommend Vital AF 1.2 @ 60 mL/hr. This provides 1728 kcal, 108 gm protein, and 1164 mls water.  This will meet 100% of RDIs  NUTRITION DIAGNOSIS:   Inadequate oral intake related to inability to eat as evidenced by NPO status.  GOAL:   Provide needs based on ASPEN/SCCM guidelines  MONITOR:   Vent status, I & O's, Labs, Weight trends  REASON FOR ASSESSMENT:   Ventilator   ASSESSMENT:  57 y.o. female with history of HTN, RA, ADHD, PTSD, anxiety, depression, restless leg syndrome, chronic lower back pain who presents to ED with dysphagia & tremor. Pt became very agitated in ED, she was admitted with encephalopathy and decision was made to intubate her.   Pt is ventilated and sedated. Spoke with pt's husband at time of visit and he reports pt eats a lot of junk food (ice cream and pepsi). No further food history was reported. Will continue to monitor.   Access: 18 Fr NG/OGT placed on 4/25. Terminates in upper stomach per abdominal x-ray 4/25. 54 cm at right side of mouth.  MAP: 50-150 x 24 hrs (MAP >60 since 6 am)  Pressors: Norepinephrine 2 mcg/kg/min  Patient is currently intubated on ventilator support Ve: 8.1 L/min TMAX: 38.1 degrees C  Propofol: N/A  Medications reviewed and include: aspirin, enoxaparin, protonix, potassium chloride, propofol 10 mg/mL bolus, dexmedetomidine, lactated ringers 1,000 mL, norepinephrine  Labs reviewed: 4/24 K 3.4(L), folate 13.1 wnl, 4/24 WBC 10.3 wnl, CBG 150-160 x 24 hrs  I/O: 260 mLs in via IV; 60 mLs out urine  Weight trend: Per chart, pt has stable weight fluctuating 4-6 lbs over the past 4 months.   Discussed pt with RN and on rounds.  NUTRITION - FOCUSED PHYSICAL EXAM:    Most Recent Value  Orbital Region  No depletion  Upper Arm Region  No depletion   Thoracic and Lumbar Region  No depletion  Buccal Region  Unable to assess  Temple Region  No depletion  Clavicle Bone Region  No depletion  Clavicle and Acromion Bone Region  No depletion  Scapular Bone Region  Unable to assess  Dorsal Hand  Mild depletion  Patellar Region  Mild depletion  Anterior Thigh Region  Mild depletion  Posterior Calf Region  Unable to assess [mild bilateral edema]  Edema (RD Assessment)  Mild [bilateral legs]  Hair  Reviewed  Eyes  Unable to assess  Mouth  Unable to assess  Skin  Reviewed  Nails  Reviewed     Diet Order:  Diet NPO time specified Seizure precautions  EDUCATION NEEDS:   Not appropriate for education at this time  Skin:  Skin Assessment: Reviewed RN Assessment  Last BM:  unknown  Height:   Ht Readings from Last 1 Encounters:  12/21/17 5\' 6"  (1.676 m)    Weight:   Wt Readings from Last 1 Encounters:  12/21/17 154 lb 15.7 oz (70.3 kg)    Ideal Body Weight:  59.1 kg  BMI:  Body mass index is 25.01 kg/m.  Estimated Nutritional Needs:   Kcal:  1650 kcal (PSU)  Protein:  91-105 g/kg/day (ABW x 1.3-1.5)  Fluid:  2.1-2.4 L/day (30-35 mL/kg/day)  12/23/17, MS Dietetic Intern [phone]

## 2017-12-22 DIAGNOSIS — E876 Hypokalemia: Secondary | ICD-10-CM

## 2017-12-22 LAB — CBC WITH DIFFERENTIAL/PLATELET
BASOS ABS: 0 10*3/uL (ref 0–0.1)
BASOS PCT: 1 %
EOS PCT: 1 %
Eosinophils Absolute: 0.1 10*3/uL (ref 0–0.7)
HCT: 34.5 % — ABNORMAL LOW (ref 35.0–47.0)
Hemoglobin: 11.7 g/dL — ABNORMAL LOW (ref 12.0–16.0)
Lymphocytes Relative: 20 %
Lymphs Abs: 1.6 10*3/uL (ref 1.0–3.6)
MCH: 32.3 pg (ref 26.0–34.0)
MCHC: 33.9 g/dL (ref 32.0–36.0)
MCV: 95.5 fL (ref 80.0–100.0)
MONOS PCT: 6 %
Monocytes Absolute: 0.5 10*3/uL (ref 0.2–0.9)
Neutro Abs: 5.7 10*3/uL (ref 1.4–6.5)
Neutrophils Relative %: 72 %
PLATELETS: 230 10*3/uL (ref 150–440)
RBC: 3.61 MIL/uL — ABNORMAL LOW (ref 3.80–5.20)
RDW: 13.7 % (ref 11.5–14.5)
WBC: 7.9 10*3/uL (ref 3.6–11.0)

## 2017-12-22 LAB — BASIC METABOLIC PANEL
Anion gap: 5 (ref 5–15)
BUN: 16 mg/dL (ref 6–20)
CALCIUM: 7.4 mg/dL — AB (ref 8.9–10.3)
CO2: 25 mmol/L (ref 22–32)
Chloride: 110 mmol/L (ref 101–111)
Creatinine, Ser: 0.65 mg/dL (ref 0.44–1.00)
GFR calc Af Amer: >60 mL/min (ref >60)
GLUCOSE: 130 mg/dL — AB (ref 65–99)
Potassium: 3.5 mmol/L (ref 3.5–5.1)
Sodium: 140 mmol/L (ref 135–145)

## 2017-12-22 LAB — GLUCOSE, CAPILLARY
GLUCOSE-CAPILLARY: 129 mg/dL — AB (ref 65–99)
GLUCOSE-CAPILLARY: 164 mg/dL — AB (ref 65–99)
Glucose-Capillary: 131 mg/dL — ABNORMAL HIGH (ref 65–99)
Glucose-Capillary: 139 mg/dL — ABNORMAL HIGH (ref 65–99)

## 2017-12-22 LAB — RPR: RPR: NONREACTIVE

## 2017-12-22 LAB — TRIGLYCERIDES: TRIGLYCERIDES: 125 mg/dL (ref <150)

## 2017-12-22 MED ORDER — SODIUM CHLORIDE 0.9 % IV BOLUS
1000.0000 mL | Freq: Once | INTRAVENOUS | Status: AC
  Administered 2017-12-22: 1000 mL via INTRAVENOUS

## 2017-12-22 MED ORDER — VITAL AF 1.2 CAL PO LIQD
1000.0000 mL | ORAL | Status: DC
  Administered 2017-12-22: 1000 mL

## 2017-12-22 MED ORDER — FENTANYL 50 MCG/HR TD PT72
50.0000 ug | MEDICATED_PATCH | TRANSDERMAL | Status: DC
  Administered 2017-12-22 – 2017-12-25 (×2): 50 ug via TRANSDERMAL
  Filled 2017-12-22 (×2): qty 1

## 2017-12-22 MED ORDER — VITAL HIGH PROTEIN PO LIQD: 1000.0000 mL | ORAL | Status: DC

## 2017-12-22 MED ORDER — INSULIN ASPART 100 UNIT/ML ~~LOC~~ SOLN
0.0000 [IU] | SUBCUTANEOUS | Status: DC
  Administered 2017-12-22: 4 [IU] via SUBCUTANEOUS
  Administered 2017-12-22 (×2): 2 [IU] via SUBCUTANEOUS
  Administered 2017-12-23: 4 [IU] via SUBCUTANEOUS
  Administered 2017-12-23: 8 [IU] via SUBCUTANEOUS
  Administered 2017-12-23: 2 [IU] via SUBCUTANEOUS
  Administered 2017-12-23: 12 [IU] via SUBCUTANEOUS
  Administered 2017-12-23 (×2): 8 [IU] via SUBCUTANEOUS
  Administered 2017-12-24: 2 [IU] via SUBCUTANEOUS
  Administered 2017-12-24 (×2): 4 [IU] via SUBCUTANEOUS
  Administered 2017-12-24: 2 [IU] via SUBCUTANEOUS
  Filled 2017-12-22 (×13): qty 1

## 2017-12-22 MED ORDER — METHYLPREDNISOLONE SODIUM SUCC 40 MG IJ SOLR
40.0000 mg | Freq: Three times a day (TID) | INTRAMUSCULAR | Status: DC
  Administered 2017-12-22 – 2017-12-23 (×3): 40 mg via INTRAVENOUS
  Filled 2017-12-22 (×3): qty 1

## 2017-12-22 MED ORDER — DOCUSATE SODIUM 50 MG/5ML PO LIQD
100.0000 mg | Freq: Every day | ORAL | Status: DC
Start: 2017-12-22 — End: 2017-12-25
  Administered 2017-12-22 – 2017-12-24 (×3): 100 mg
  Filled 2017-12-22 (×3): qty 10

## 2017-12-22 MED ORDER — MIRTAZAPINE 15 MG PO TBDP
15.0000 mg | ORAL_TABLET | Freq: Every day | ORAL | Status: DC
  Administered 2017-12-22: 15 mg via ORAL
  Filled 2017-12-22: qty 1

## 2017-12-22 MED ORDER — PROPOFOL 1000 MG/100ML IV EMUL
5.0000 ug/kg/min | INTRAVENOUS | Status: DC
  Administered 2017-12-22: 30 ug/kg/min via INTRAVENOUS
  Administered 2017-12-22: 55 ug/kg/min via INTRAVENOUS
  Administered 2017-12-23 (×2): 60 ug/kg/min via INTRAVENOUS
  Filled 2017-12-22 (×7): qty 100

## 2017-12-22 MED ORDER — PRO-STAT SUGAR FREE PO LIQD: 30.0000 mL | Freq: Two times a day (BID) | ORAL | Status: DC

## 2017-12-22 NOTE — Progress Notes (Signed)
PULMONARY / CRITICAL CARE MEDICINE   Name: Karen Chen MRN: [id number] DOB: 1960/12/13    ADMISSION DATE:  12/21/2017 CONSULTATION DATE:  12/21/2017  HISTORY OF PRESENT ILLNESS:   24 F with history of severe anxiety and restless leg syndrome, on multiple psychotropic medications who underwent some adjustments (discontinuation of Valium and Requip) the day prior to presentation.  She contacted EMS and was transported to [facility] ED in the early morning hours 4/25.  Her husband sleeps in a different room and is not certain exactly why she called EMS.  He noted that she was short of breath.  Upon arrival to the emergency department, she was hypoxemic.  Subsequently, she became increasingly agitated and failed to respond to both lorazepam and haloperidol.  Therefore, she was intubated for profound agitation and combativeness.   REVIEW OF SYSTEMS:   Un attainable  SUBJECTIVE:  No acute events in the night.  Nods appropriately.  No pain  VITAL SIGNS: BP (!) 98/59   Pulse (!) 57   Temp 99.3 F (37.4 C)   Resp (!) 24   Ht 5\' 6"  (1.676 m)   Wt 154 lb 8.7 oz (70.1 kg)   LMP  (LMP Unknown)   SpO2 98%   BMI 24.94 kg/m   HEMODYNAMICS:    VENTILATOR SETTINGS: Vent Mode: PRVC FiO2 (%):  [30 %] 30 % Set Rate:  [15 bmp] 15 bmp Vt Set:  [450 mL] 450 mL PEEP:  [5 cmH20] 5 cmH20 Plateau Pressure:  [13 cmH20] 13 cmH20  INTAKE / OUTPUT: I/O last 3 completed shifts: In: 1779.6 [I.V.:1212.9; IV Piggyback:566.7] Out: 685 [Urine:685]  PHYSICAL EXAMINATION: General: Intubated, appropriate to commands Neuro: PERRLA, EOMI, corneal reflexes intact, moves all extremities HEENT: NCAT, sclerae white Cardiovascular: Regular, no M Lungs: Clear to auscultation anteriorly Abdomen: Soft, + BS, NT Extremities: Warm, no edema Skin: No lesions noted   LABS:  BMET Recent Labs  Lab 12/20/17 0155 12/22/17 0658  NA 138 140  K 3.4* 3.5  CL 103 110  CO2 27 25  BUN 22* 16  CREATININE 0.86 0.65   GLUCOSE 329* 130*    Electrolytes Recent Labs  Lab 12/20/17 0155 12/22/17 0658  CALCIUM 8.6* 7.4*    CBC Recent Labs  Lab 12/20/17 0155 12/22/17 0658  WBC 10.3 7.9  HGB 13.5 11.7*  HCT 39.9 34.5*  PLT 358 230    Coag's Recent Labs  Lab 12/21/17 0414  INR 0.88    Sepsis Markers Recent Labs  Lab 12/21/17 0414 12/21/17 0738 12/21/17 1424  LATICACIDVEN 2.1* 2.1*  --   PROCALCITON  --   --  <0.10    ABG Recent Labs  Lab 12/21/17 0515  PHART 7.33*  PCO2ART 52*  PO2ART 124*    Liver Enzymes Recent Labs  Lab 12/20/17 0155  AST 27  ALT 21  ALKPHOS 108  BILITOT 0.2*  ALBUMIN 3.4*    Cardiac Enzymes No results for input(s): TROPONINI, PROBNP in the last 168 hours.  Glucose Recent Labs  Lab 12/21/17 0838 12/21/17 1109 12/21/17 1708 12/21/17 2101 12/22/17 0724  GLUCAP 160* 150* 141* 130* 131*    Imaging No results found.   STUDIES:  nil  CULTURES: MRSA- negative  ANTIBIOTICS: None  SIGNIFICANT EVENTS: none  LINES/TUBES: Peripheral IVs  DISCUSSION: This 57 year old lady was admitted with combativeness and thought to be withdrawing from Benzodiazepine.  ASSESSMENT / PLAN:  1. Acute respiratory failure secondary to airway compromise. Patient failure attempt of extubation because  of inaudible air leak 2. Acute encephalopathy from Valium withdrawals 3. Hypokalemia 4. Hx of Anxiety disorder on multiple psychotropic medications  Plan: 1. Started on short course of steroids, SSI.  Change sedation to Propofol for rest since patient still combative. 2. Potassium replacement 3. GI and DVT prophylaxis 4. Continue on Valium 5. Start low dose TF to minimize bacteria translocation.  FAMILY  - Updates: Husband and daughter updated  I have dedicated a total of 40 minutes in critical care time minus all appropriate exclusions.   Jackson Latino, MD Pulmonary and Critical Care Medicine [facility] Pager: [phone]  12/22/2017, 9:43 AM

## 2017-12-22 NOTE — Progress Notes (Signed)
[facility] [employer] - [place] at [facility]   PATIENT NAME: Karen Chen    MR#:  [id number]  DATE OF BIRTH:  [birth year]  SUBJECTIVE:   Pt. Here due to AMS and suspected due to withdrawal from Benzo's Likely Valium.  Attempted weaning her off the one today but patient had no cuff leak and therefore has underlying airway edema and therefore was placed on steroids and kept on a ventilator and sedated again.  REVIEW OF SYSTEMS:    Review of Systems  Unable to perform ROS: Intubated    Nutrition: tube feeds Tolerating Diet: Yes  Tolerating PT: Await Eval.    DRUG ALLERGIES:   Allergies  Allergen Reactions  . Orencia [Abatacept] Anaphylaxis  . Sulfa Antibiotics Anaphylaxis  . Azathioprine Other (See Comments)  . Indocin [Indomethacin]   . Levaquin [Levofloxacin In D5w]   . Methotrexate Derivatives   . Prozac [Fluoxetine Hcl]   . Remicade [Infliximab] Hives  . Robaxin [Methocarbamol]   . Tizanidine   . Zanaflex [Tizanidine Hcl] Other (See Comments)    Patient states a burning sensation in her mouth 30 minutes after taking.  . Adalimumab Itching and Rash    VITALS:  Blood pressure 131/74, pulse 60, temperature 99.1 F (37.3 C), resp. rate 14, height 5\' 6"  (1.676 m), weight 70.1 kg (154 lb 8.7 oz), SpO2 95 %.  PHYSICAL EXAMINATION:   Physical Exam  GENERAL:  57 y.o.-year-old patient lying in bed sedated & Intubated.  EYES: Pupils equal, round, reactive to light. No scleral icterus.  HEENT: Head atraumatic, normocephalic.  ET and OG tubes in place. NECK:  Supple, no jugular venous distention. No thyroid enlargement, no tenderness.  LUNGS: Normal breath sounds bilaterally, no wheezing, rales, rhonchi. No use of accessory muscles of respiration.  CARDIOVASCULAR: S1, S2 normal. No murmurs, rubs, or gallops.  ABDOMEN: Soft, nontender, nondistended. Bowel sounds present. No organomegaly or mass.  EXTREMITIES: No cyanosis, clubbing or edema b/l.    NEUROLOGIC: Sedated  and intubated PSYCHIATRIC: Sedated and intubated SKIN: No obvious rash, lesion, or ulcer.    LABORATORY PANEL:   CBC Recent Labs  Lab 12/22/17 0658  WBC 7.9  HGB 11.7*  HCT 34.5*  PLT 230   ------------------------------------------------------------------------------------------------------------------  Chemistries  Recent Labs  Lab 12/20/17 0155 12/22/17 0658  NA 138 140  K 3.4* 3.5  CL 103 110  CO2 27 25  GLUCOSE 329* 130*  BUN 22* 16  CREATININE 0.86 0.65  CALCIUM 8.6* 7.4*  AST 27  --   ALT 21  --   ALKPHOS 108  --   BILITOT 0.2*  --    ------------------------------------------------------------------------------------------------------------------  Cardiac Enzymes No results for input(s): TROPONINI in the last 168 hours. ------------------------------------------------------------------------------------------------------------------  RADIOLOGY:  Ct Head Wo Contrast  Result Date: 12/21/2017 CLINICAL DATA:  Dysphagia.  Altered level of consciousness. EXAM: CT HEAD WITHOUT CONTRAST TECHNIQUE: Contiguous axial images were obtained from the base of the skull through the vertex without intravenous contrast. COMPARISON:  MRI of the head September 16, 2016 FINDINGS: BRAIN: No intraparenchymal hemorrhage, mass effect nor midline shift. The ventricles and sulci are normal for age. Patchy supratentorial white matter hypodensities. No acute large vascular territory infarcts. No abnormal extra-axial fluid collections. Basal cisterns are patent. VASCULAR: Mild calcific atherosclerosis of the carotid siphons. SKULL: No skull fracture. Severe LEFT temporomandibular osteoarthrosis. No significant scalp soft tissue swelling. SINUSES/ORBITS: Chronic RIGHT sphenoid sinusitis. Mild paranasal sinus mucosal thickening with bilateral concha bullosa.The included ocular globes and orbital contents are  non-suspicious. OTHER: None. IMPRESSION: 1. No acute intracranial process. 2. Mild chronic  small vessel ischemic disease. Electronically Signed   By: Awilda Metro M.D.   On: 12/21/2017 04:55   Dg Chest Portable 1 View  Result Date: 12/21/2017 CLINICAL DATA:  Endotracheal tube position. EXAM: PORTABLE CHEST 1 VIEW COMPARISON:  12/21/2017 FINDINGS: Interval withdrawal of the endotracheal tube, now measuring 6.5 cm above the carina versus 4 cm previously. An enteric tube is been placed with tip in the left upper quadrant consistent with location in the upper stomach. Heart size and pulmonary vascularity are normal. Lungs are clear. No blunting of costophrenic angles. No pneumothorax. IMPRESSION: Endotracheal tube tip measuring 6.5 cm above the carina, withdrawn from 4 cm previously. Enteric tube tip in the left upper quadrant consistent with location in the stomach. Lungs are clear. Electronically Signed   By: Burman Nieves M.D.   On: 12/21/2017 06:04   Dg Chest Portable 1 View  Result Date: 12/21/2017 CLINICAL DATA:  Difficulty swallowing. Anxiety. Endotracheal tube placement. EXAM: PORTABLE CHEST 1 VIEW COMPARISON:  04/18/2011 FINDINGS: Endotracheal tube has been placed with tip measuring 4 cm above the carina. Normal inspiration. Heart size and pulmonary vascularity are normal. No airspace disease or consolidation in the lungs. No blunting of costophrenic angles. No pneumothorax. Mediastinal contours appear intact. IMPRESSION: Endotracheal tube appears in satisfactory position. No evidence of active pulmonary disease. Electronically Signed   By: Burman Nieves M.D.   On: 12/21/2017 04:17   Dg Abd Portable 1v  Result Date: 12/21/2017 CLINICAL DATA:  OG tube placement EXAM: PORTABLE ABDOMEN - 1 VIEW COMPARISON:  None. FINDINGS: Enteric tube tip in the left upper quadrant consistent with location in the upper stomach. Visualized colon is stool filled without distention. No small bowel distention. IMPRESSION: Enteric tube tip in the left upper quadrant consistent with location in the  upper stomach. Electronically Signed   By: Burman Nieves M.D.   On: 12/21/2017 06:03     ASSESSMENT AND PLAN:   57 year old female with past medical history of chronic lower back pain, hypertension, restless leg syndrome, rheumatoid arthritis, anxiety resented to the hospital due to altered mental status/encephalopathy.  1.) AMS/encephalopathy/agitation:  -Suspected to be secondary to polypharmacy and suspected withdrawal from Valium.  Patient apparently stopped her Valium as per her primary care physician few days back. - placed on valium due to risk of withdrawal -CT head (-) acute intracranial process.  TSH, B12, folate are within normal range.   -Failed LP in ED (2/2 Hx spine surgery w/ hardware).  Likely this is not encephalitis or meningitis.  No need for further lumbar puncture.  Pt. Has been taken off abx.  - follow mental status once extuabated.   2.) HTN: Hold HCTZ. - BP stable.   3.) ADHD/PTSD/anx/dep/RLS: Holding home Sinemet, Lexapro, Neurontin, Remeron, Requip, Valium, Percocet, as above.  4.  Acute respiratory failure-patient was intubated for airway protection.   -Attempted extubation today but patient had no cuff leak and likely has underlying airway edema.  Started on IV steroids.  Patient was sedated and put back on the ventilator. -Continue weaning as per pulmonary.  5.  Hyperglycemia-secondary to steroids.  Continue sliding scale insulin.  All the records are reviewed and case discussed with Care Management/Social Worker. Management plans discussed with the patient, family and they are in agreement.  CODE STATUS: Full code  DVT Prophylaxis: Lovenox  TOTAL TIME TAKING CARE OF THIS PATIENT: 30 minutes.   POSSIBLE D/C unclear, DEPENDING  ON CLINICAL CONDITION.   Houston Siren M.D on 12/22/2017 at 3:13 PM  Between 7am to 6pm - Pager - [phone]  After 6pm go to [email or web address] - Scientist, research (life sciences) [facility]  Office   [phone]  CC: Primary care physician; Carlean Jews, NP

## 2017-12-23 ENCOUNTER — Inpatient Hospital Stay: Payer: Medicare Other

## 2017-12-23 DIAGNOSIS — G9341 Metabolic encephalopathy: Principal | ICD-10-CM

## 2017-12-23 DIAGNOSIS — J9601 Acute respiratory failure with hypoxia: Secondary | ICD-10-CM

## 2017-12-23 LAB — BASIC METABOLIC PANEL
Anion gap: 6 (ref 5–15)
BUN: 15 mg/dL (ref 6–20)
CHLORIDE: 111 mmol/L (ref 101–111)
CO2: 24 mmol/L (ref 22–32)
Calcium: 8.6 mg/dL — ABNORMAL LOW (ref 8.9–10.3)
Creatinine, Ser: 0.57 mg/dL (ref 0.44–1.00)
GFR calc Af Amer: >60 mL/min (ref >60)
GFR calc non Af Amer: >60 mL/min (ref >60)
Glucose, Bld: 257 mg/dL — ABNORMAL HIGH (ref 65–99)
POTASSIUM: 3.4 mmol/L — AB (ref 3.5–5.1)
Sodium: 141 mmol/L (ref 135–145)

## 2017-12-23 LAB — GLUCOSE, CAPILLARY
GLUCOSE-CAPILLARY: 207 mg/dL — AB (ref 65–99)
GLUCOSE-CAPILLARY: 157 mg/dL — AB (ref 65–99)
Glucose-Capillary: 180 mg/dL — ABNORMAL HIGH (ref 65–99)
Glucose-Capillary: 203 mg/dL — ABNORMAL HIGH (ref 65–99)
Glucose-Capillary: 248 mg/dL — ABNORMAL HIGH (ref 65–99)
Glucose-Capillary: 251 mg/dL — ABNORMAL HIGH (ref 65–99)

## 2017-12-23 LAB — CULTURE, RESPIRATORY W GRAM STAIN: Culture: NORMAL

## 2017-12-23 LAB — MAGNESIUM: Magnesium: 1.8 mg/dL (ref 1.7–2.4)

## 2017-12-23 LAB — CULTURE, RESPIRATORY: SPECIAL REQUESTS: NORMAL

## 2017-12-23 LAB — PHOSPHORUS: Phosphorus: 1.9 mg/dL — ABNORMAL LOW (ref 2.5–4.6)

## 2017-12-23 MED ORDER — ROPINIROLE HCL 1 MG PO TABS: 4.0000 mg | ORAL_TABLET | Freq: Two times a day (BID) | ORAL | Status: DC

## 2017-12-23 MED ORDER — MIRTAZAPINE 15 MG PO TBDP
15.0000 mg | ORAL_TABLET | Freq: Every day | ORAL | Status: DC
  Filled 2017-12-23: qty 1

## 2017-12-23 MED ORDER — ROPINIROLE HCL 1 MG PO TABS
2.0000 mg | ORAL_TABLET | Freq: Two times a day (BID) | ORAL | Status: DC
  Administered 2017-12-23 – 2017-12-26 (×5): 2 mg via ORAL
  Filled 2017-12-23 (×7): qty 2

## 2017-12-23 MED ORDER — DEXMEDETOMIDINE HCL IN NACL 400 MCG/100ML IV SOLN
0.4000 ug/kg/h | INTRAVENOUS | Status: DC
  Administered 2017-12-23: 0.8 ug/kg/h via INTRAVENOUS
  Administered 2017-12-23 – 2017-12-24 (×2): 1 ug/kg/h via INTRAVENOUS
  Filled 2017-12-23 (×5): qty 100

## 2017-12-23 MED ORDER — INSULIN GLARGINE 100 UNIT/ML ~~LOC~~ SOLN
10.0000 [IU] | Freq: Every day | SUBCUTANEOUS | Status: DC
  Administered 2017-12-23 – 2017-12-24 (×2): 10 [IU] via SUBCUTANEOUS
  Filled 2017-12-23 (×3): qty 0.1

## 2017-12-23 MED ORDER — PHOSPHA 250 NEUTRAL 155-852-130 MG PO TABS
500.0000 mg | ORAL_TABLET | Freq: Two times a day (BID) | ORAL | Status: DC
  Filled 2017-12-23: qty 2

## 2017-12-23 MED ORDER — GABAPENTIN 300 MG PO CAPS
600.0000 mg | ORAL_CAPSULE | Freq: Two times a day (BID) | ORAL | Status: DC
  Administered 2017-12-23 – 2017-12-26 (×5): 600 mg via ORAL
  Filled 2017-12-23 (×5): qty 2

## 2017-12-23 MED ORDER — POTASSIUM & SODIUM PHOSPHATES 280-160-250 MG PO PACK
2.0000 | PACK | Freq: Two times a day (BID) | ORAL | Status: AC
  Administered 2017-12-23 (×2): 2
  Filled 2017-12-23 (×2): qty 2

## 2017-12-23 MED ORDER — MAGNESIUM SULFATE 2 GM/50ML IV SOLN
2.0000 g | Freq: Once | INTRAVENOUS | Status: AC
  Administered 2017-12-23: 2 g via INTRAVENOUS
  Filled 2017-12-23: qty 50

## 2017-12-23 NOTE — Progress Notes (Signed)
Attempted to wake up, and wean pt.  Pt tolerated pressure support with no difficulty.  However, she was unable to focus her eyes and track.  She was squeezing her hands, moving all extremities on command, but unable to focus with her eyes.  MD placed patient back on sedation, and reports we will seek and MRI as agitation is similar to the agitation she experienced at the time of admission.

## 2017-12-23 NOTE — Progress Notes (Signed)
Patient ID: Karen Chen, female   DOB: Jun 23, 1961, 57 y.o.   MRN: [id number]  [employer] PROGRESS NOTE  Alyxandria Wentz [facility] IJL:[id number] DOB: 12-Jun-1961 DOA: 12/21/2017 PCP: Ronnell Freshwater, NP  HPI/Subjective: With weaning trial today the patient became very agitated.  Patient is sedated and intubated and unable to provide any history at this time.  Objective: Vitals:   12/23/17 0929 12/23/17 1133  BP:    Pulse:    Resp:    Temp:    SpO2: 99% 98%    Filed Weights   12/21/17 0846 12/22/17 0334 12/23/17 0342  Weight: 70.3 kg (154 lb 15.7 oz) 70.1 kg (154 lb 8.7 oz) 70.9 kg (156 lb 4.9 oz)    ROS: Review of Systems  Unable to perform ROS: Acuity of condition   Exam: Physical Exam  Constitutional: She is intubated.  HENT:  Nose: No mucosal edema.  Eyes: Pupils are equal, round, and reactive to light. Conjunctivae and lids are normal.  Neck: Carotid bruit is not present. No thyroid mass present.  Cardiovascular: S1 normal, S2 normal and normal heart sounds.  Pulses:      Dorsalis pedis pulses are 1+ on the right side, and 1+ on the left side.  Respiratory: She is intubated. She has decreased breath sounds in the right lower field and the left lower field. She has no wheezes. She has rhonchi in the right lower field and the left lower field. She has no rales.  GI: Soft. Bowel sounds are normal. There is no tenderness.  Musculoskeletal:       Right ankle: She exhibits no swelling.       Left ankle: She exhibits no swelling.  Lymphadenopathy:    She has no cervical adenopathy.  Neurological:  Patient intubated and sedated  Skin: Skin is warm. No rash noted. Nails show no clubbing.  Psychiatric:  Intubated and sedated      Data Reviewed: Basic Metabolic Panel: Recent Labs  Lab 12/20/17 0155 12/22/17 0658 12/23/17 0436  NA 138 140 141  K 3.4* 3.5 3.4*  CL 103 110 111  CO2 _0 GLUCOSE 329* 130* 257*  BUN 22* 16 15  CREATININE 0.86 0.65 0.57   CALCIUM 8.6* 7.4* 8.6*  MG  --   --  1.8  PHOS  --   --  1.9*   Liver Function Tests: Recent Labs  Lab 12/20/17 0155  AST 27  ALT 21  ALKPHOS 108  BILITOT 0.2*  PROT 6.7  ALBUMIN 3.4*   CBC: Recent Labs  Lab 12/20/17 0155 12/22/17 0658  WBC 10.3 7.9  NEUTROABS 8.4* 5.7  HGB 13.5 11.7*  HCT 39.9 34.5*  MCV 93.9 95.5  PLT 358 230    CBG: Recent Labs  Lab 12/22/17 1920 12/23/17 0011 12/23/17 0350 12/23/17 0718 12/23/17 1113  GLUCAP 164* 180* 251* 248* 207*    Recent Results (from the past 240 hour(s))  Blood Culture (routine x 2)     Status: None (Preliminary result)   Collection Time: 12/21/17  4:14 AM  Result Value Ref Range Status   Specimen Description BLOOD BLOOD LEFT FOREARM  Final   Special Requests   Final    BOTTLES DRAWN AEROBIC AND ANAEROBIC Blood Culture results may not be optimal due to an excessive volume of blood received in culture bottles   Culture   Final    NO GROWTH 2 DAYS Performed at [facility], [address], [place], [place] [address]  Report Status PENDING  Incomplete  Blood Culture (routine x 2)     Status: None (Preliminary result)   Collection Time: 12/21/17  4:14 AM  Result Value Ref Range Status   Specimen Description BLOOD BLOOD LEFT WRIST  Final   Special Requests   Final    BOTTLES DRAWN AEROBIC AND ANAEROBIC Blood Culture results may not be optimal due to an inadequate volume of blood received in culture bottles   Culture   Final    NO GROWTH 2 DAYS Performed at [facility], [address], [place], [place] [address]    Report Status PENDING  Incomplete  Culture, respiratory (NON-Expectorated)     Status: None (Preliminary result)   Collection Time: 12/21/17  5:50 AM  Result Value Ref Range Status   Specimen Description   Final    TRACHEAL ASPIRATE Performed at [facility], [address] [place], [place] [address]    Special Requests   Final    Normal Performed at [facility], [address] [place], [place] [address]    Gram Stain   Final    MODERATE WBC PRESENT,BOTH PMN AND MONONUCLEAR RARE SQUAMOUS EPITHELIAL CELLS PRESENT MODERATE GRAM POSITIVE COCCI IN PAIRS IN CHAINS RARE GRAM POSITIVE RODS    Culture   Final    CULTURE REINCUBATED FOR BETTER GROWTH Performed at [facility], [place] [address], [place], [place] [address]    Report Status PENDING  Incomplete  MRSA PCR Screening     Status: None   Collection Time: 12/21/17  8:42 AM  Result Value Ref Range Status   MRSA by PCR NEGATIVE NEGATIVE Final    Comment:        The GeneXpert MRSA Assay (FDA approved for NASAL specimens only), is one component of a comprehensive MRSA colonization surveillance program. It is not intended to diagnose MRSA infection nor to guide or monitor treatment for MRSA infections. Performed at [facility], [address] [place], [place] [address]      Studies: Dg Chest [facility] 1 View  Result Date: 12/23/2017 CLINICAL DATA:  Respiratory failure EXAM: PORTABLE CHEST 1 VIEW COMPARISON:  12/21/2017 FINDINGS: Endotracheal tube advanced. Tip is 4.0 cm from the carina. Stable NG tube. Bilateral basilar hazy airspace disease versus atelectasis. Normal heart size. No pneumothorax. IMPRESSION: Bibasilar hazy airspace disease versus atelectasis. Electronically Signed   By: Marybelle Killings M.D.   On: 12/23/2017 10:11    Scheduled Meds: . albuterol  2.5 mg Nebulization Q6H  . aspirin  81 mg Per Tube Daily  . chlorhexidine gluconate (MEDLINE KIT)  15 mL Mouth Rinse BID  . diazepam  5 mg Per Tube BID  . docusate  100 mg Per Tube Daily  . enoxaparin (LOVENOX) injection  40 mg Subcutaneous Q24H  . fentaNYL  50 mcg Transdermal Q72H  . insulin aspart  0-24 Units Subcutaneous Q4H  . mouth rinse  15 mL Mouth Rinse QID  . pantoprazole sodium  40 mg Per Tube Daily  . potassium & sodium phosphates  2 packet Per Tube BID   Continuous Infusions: . sodium  chloride    . dexmedetomidine (PRECEDEX) IV infusion 0.8 mcg/kg/hr (12/23/17 0937)  . feeding supplement (VITAL AF 1.2 CAL) 60 mL/hr at 12/23/17 0600  . norepinephrine (LEVOPHED) Adult infusion Stopped (12/22/17 0302)  . propofol (DIPRIVAN) infusion 40 mcg/kg/min (12/23/17 9381)    Assessment/Plan:  1. Acute metabolic encephalopathy with agitation.  Initial CT scan of the brain  negative.  Can consider repeat CT scan of the brain versus MRI of the brain.  Patient failed her weaning trial today and is re-sedated with dipper Lucianne Lei and Precedex.  Patient was restarted on low-dose Valium.  EEG negative for seizure.  As per critical care specialist family requesting transfer to tertiary care center.  If the patient states here can consider neurology consultation.  Patient does take prednisone at home.  The patient did receive Solu-Medrol yesterday.  Case discussed with critical care specialist. 2. Acute hypoxic respiratory failure.  Patient intubated for airway protection.  Patient failed weaning protocol today. 3. Hypokalemia, hypomagnesemia and hypophosphatemia.  Replace electrolytes 4. Essential hypertension hydrochlorothiazide on hold 5. Chronic low back pain, restless leg syndrome, RA and anxiety.  Patient placed on fentanyl patch.   patient on Valium.  Her other psychiatric medications were held.  Code Status:     Code Status Orders  (From admission, onward)        Start     Ordered   12/21/17 0841  Full code  Continuous     12/21/17 0840    Code Status History    Date Active Date Inactive Code Status Order ID Comments User Context   09/16/2016 0346 09/16/2016 2114 Full Code [id number]  Harvie Bridge, DO ED     Family Communication: As per critical care specialist Disposition Plan: Critical care specialist looking into tertiary care center.  Consultants:  Critical care specialist  Time spent: 27 minutes  [place]

## 2017-12-23 NOTE — Progress Notes (Addendum)
PULMONARY / CRITICAL CARE MEDICINE   Name: Karen Chen MRN: [id number] DOB: 1961/07/14    ADMISSION DATE:  12/21/2017 CONSULTATION DATE:  12/21/2017  HISTORY OF PRESENT ILLNESS:   65 F with history of severe anxiety and restless leg syndrome, on multiple psychotropic medications who underwent some adjustments (discontinuation of Valium and Requip) the day prior to presentation.  She contacted EMS and was transported to [facility] ED in the early morning hours 4/25.  Her husband sleeps in a different room and is not certain exactly why she called EMS.  He noted that she was short of breath.  Upon arrival to the emergency department, she was hypoxemic.  Subsequently, she became increasingly agitated and failed to respond to both lorazepam and haloperidol.  Therefore, she was intubated for profound agitation and combativeness.   REVIEW OF SYSTEMS:   Un attainable  SUBJECTIVE:  No acute events in the night.    VITAL SIGNS: BP 132/71   Pulse 95   Temp (!) 97.3 F (36.3 C)   Resp (!) 26   Ht 5\' 6"  (1.676 m)   Wt 156 lb 4.9 oz (70.9 kg)   LMP  (LMP Unknown)   SpO2 99%   BMI 25.23 kg/m   HEMODYNAMICS:  no compromise  VENTILATOR SETTINGS: Vent Mode: PRVC FiO2 (%):  [30 %-40 %] 30 % Set Rate:  [15 bmp-20 bmp] 20 bmp Vt Set:  [450 mL] 450 mL PEEP:  [5 cmH20] 5 cmH20 Pressure Support:  [5 cmH20] 5 cmH20 Plateau Pressure:  [18 cmH20] 18 cmH20  INTAKE / OUTPUT: I/O last 3 completed shifts: In: 3839.4 [I.V.:2562.8; NG/GT:910; IV Piggyback:366.7] Out: 1825 [Urine:1825]  PHYSICAL EXAMINATION: General: Intubated, followed some commands  Neuro: PERRLA, EOMI, corneal reflexes intact, moves all extremities, upward gaze without eye contact HEENT: NCAT, sclerae white Cardiovascular: Regular, no M Lungs: Clear to auscultation anteriorly Abdomen: Soft, + BS, NT Extremities: Warm, no edema Skin: No lesions noted   LABS:  BMET Recent Labs  Lab 12/20/17 0155 12/22/17 0658  12/23/17 0436  NA 138 140 141  K 3.4* 3.5 3.4*  CL 103 110 111  CO2 27 25 24   BUN 22* 16 15  CREATININE 0.86 0.65 0.57  GLUCOSE 329* 130* 257*    Electrolytes Recent Labs  Lab 12/20/17 0155 12/22/17 0658 12/23/17 0436  CALCIUM 8.6* 7.4* 8.6*  MG  --   --  1.8  PHOS  --   --  1.9*    CBC Recent Labs  Lab 12/20/17 0155 12/22/17 0658  WBC 10.3 7.9  HGB 13.5 11.7*  HCT 39.9 34.5*  PLT 358 230    Coag's Recent Labs  Lab 12/21/17 0414  INR 0.88    Sepsis Markers Recent Labs  Lab 12/21/17 0414 12/21/17 0738 12/21/17 1424  LATICACIDVEN 2.1* 2.1*  --   PROCALCITON  --   --  <0.10    ABG Recent Labs  Lab 12/21/17 0515  PHART 7.33*  PCO2ART 52*  PO2ART 124*    Liver Enzymes Recent Labs  Lab 12/20/17 0155  AST 27  ALT 21  ALKPHOS 108  BILITOT 0.2*  ALBUMIN 3.4*    Cardiac Enzymes No results for input(s): TROPONINI, PROBNP in the last 168 hours.  Glucose Recent Labs  Lab 12/22/17 1603 12/22/17 1920 12/23/17 0011 12/23/17 0350 12/23/17 0718 12/23/17 1113  GLUCAP 139* 164* 180* 251* 248* 207*    Imaging Dg Chest Port 1 View  Result Date: 12/23/2017 CLINICAL DATA:  Respiratory failure EXAM:  PORTABLE CHEST 1 VIEW COMPARISON:  12/21/2017 FINDINGS: Endotracheal tube advanced. Tip is 4.0 cm from the carina. Stable NG tube. Bilateral basilar hazy airspace disease versus atelectasis. Normal heart size. No pneumothorax. IMPRESSION: Bibasilar hazy airspace disease versus atelectasis. Electronically Signed   By: Jolaine Click M.D.   On: 12/23/2017 10:11     STUDIES:  nil  CULTURES: MRSA- negative  ANTIBIOTICS: None  SIGNIFICANT EVENTS: Patient became tachypneic and agitated with upward gaze during SBT   LINES/TUBES: Peripheral IVs  DISCUSSION: This 57 year old lady was admitted with combativeness and thought to be withdrawing from Benzodiazepine.  ASSESSMENT / PLAN:  1. Acute respiratory failure secondary to airway compromise.  Patient failure attempt of extubation because of agitation 2. Acute encephalopathy thought to be from Valium withdrawals 3. Hypokalemia 4. Hypophosphatemia 5. Hypomagnesemia 6. Hx of Anxiety disorder on multiple psychotropic medications  Plan: 1. Continue on Vent support with sedation  2. Electrolytes replacement replacement 3. GI and DVT prophylaxis 4. Continue on Valium. I have discontinued the Remeron which was restarted yesterday since hallucination is one of its side effect. 5. Start low dose TF to minimize bacteria translocation. 6. Will consider EEG and MRI  FAMILY  - Updates: Husband and sister updated.  They have requested transfer to [facility] or [facility]  Call placed.  Awaiting call back.  I have dedicated a total of 40 minutes in critical care time minus all appropriate exclusions.  I have spoken to Dr. Ashley Royalty at [facility].  There are no bed in the ICU there. Hence she has recommended restarting Remeron, Requip, Neurontin and Lexapro. She has advised that since patient is already on Fentanyl patch there is no need to add Oxycodone.  She advised that though EEG and MRI may be good to get for completion, they are not urgently required.  Jackson Latino, MD Pulmonary and Critical Care Medicine [facility] Pager: [phone]  12/23/2017, 11:23 AM

## 2017-12-23 NOTE — Progress Notes (Signed)
Pts family requesting transfer to [facility].  MD Peggye Pitt aware, and she will speak with family.

## 2017-12-24 DIAGNOSIS — F19931 Other psychoactive substance use, unspecified with withdrawal delirium: Secondary | ICD-10-CM

## 2017-12-24 LAB — GLUCOSE, CAPILLARY
Glucose-Capillary: 123 mg/dL — ABNORMAL HIGH (ref 65–99)
Glucose-Capillary: 149 mg/dL — ABNORMAL HIGH (ref 65–99)
Glucose-Capillary: 159 mg/dL — ABNORMAL HIGH (ref 65–99)
Glucose-Capillary: 167 mg/dL — ABNORMAL HIGH (ref 65–99)
Glucose-Capillary: 170 mg/dL — ABNORMAL HIGH (ref 65–99)
Glucose-Capillary: 77 mg/dL (ref 65–99)

## 2017-12-24 LAB — COMPREHENSIVE METABOLIC PANEL
ALT: 51 U/L (ref 14–54)
ANION GAP: 6 (ref 5–15)
AST: 27 U/L (ref 15–41)
Albumin: 2.5 g/dL — ABNORMAL LOW (ref 3.5–5.0)
Alkaline Phosphatase: 82 U/L (ref 38–126)
BILIRUBIN TOTAL: 0.5 mg/dL (ref 0.3–1.2)
BUN: 27 mg/dL — ABNORMAL HIGH (ref 6–20)
CO2: 27 mmol/L (ref 22–32)
Calcium: 8.2 mg/dL — ABNORMAL LOW (ref 8.9–10.3)
Chloride: 111 mmol/L (ref 101–111)
Creatinine, Ser: 0.62 mg/dL (ref 0.44–1.00)
GFR calc Af Amer: >60 mL/min (ref >60)
GFR calc non Af Amer: >60 mL/min (ref >60)
GLUCOSE: 152 mg/dL — AB (ref 65–99)
POTASSIUM: 3.5 mmol/L (ref 3.5–5.1)
Sodium: 144 mmol/L (ref 135–145)
Total Protein: 5.7 g/dL — ABNORMAL LOW (ref 6.5–8.1)

## 2017-12-24 LAB — PHOSPHORUS: Phosphorus: 3.2 mg/dL (ref 2.5–4.6)

## 2017-12-24 LAB — LACTIC ACID, PLASMA: LACTIC ACID, VENOUS: 1.2 mmol/L (ref 0.5–1.9)

## 2017-12-24 LAB — MAGNESIUM: MAGNESIUM: 2 mg/dL (ref 1.7–2.4)

## 2017-12-24 MED ORDER — ORAL CARE MOUTH RINSE
15.0000 mL | OROMUCOSAL | Status: DC
  Administered 2017-12-24 (×6): 15 mL via OROMUCOSAL

## 2017-12-24 MED ORDER — ORAL CARE MOUTH RINSE: 15.0000 mL | OROMUCOSAL | Status: DC

## 2017-12-24 MED ORDER — POTASSIUM CHLORIDE 20 MEQ PO PACK
40.0000 meq | PACK | Freq: Once | ORAL | Status: AC
  Administered 2017-12-24: 40 meq via NASOGASTRIC

## 2017-12-24 MED ORDER — DEXTROSE-NACL 5-0.45 % IV SOLN
INTRAVENOUS | Status: DC
  Administered 2017-12-24: 18:00:00 via INTRAVENOUS

## 2017-12-24 NOTE — Progress Notes (Signed)
Pt care assumed.  Pt continues to rest in bed on vent with  propofol and precedex infusing. Family at bedside.

## 2017-12-24 NOTE — Therapy (Signed)
Pt extubated to room air.  RA saturation 96%

## 2017-12-24 NOTE — Progress Notes (Signed)
PULMONARY / CRITICAL CARE MEDICINE   Name: Karen Chen MRN: [id number] DOB: 1960-12-30    ADMISSION DATE:  12/21/2017 CONSULTATION DATE:  12/21/2017  HISTORY OF PRESENT ILLNESS:   57 F with history of severe anxiety and restless leg syndrome, on multiple psychotropic medications who underwent some adjustments (discontinuation of Valium and Requip) the day prior to presentation.  She contacted EMS and was transported to [facility] ED in the early morning hours 4/25.  Her husband sleeps in a different room and is not certain exactly why she called EMS.  He noted that she was short of breath.  Upon arrival to the emergency department, she was hypoxemic.  Subsequently, she became increasingly agitated and failed to respond to both lorazepam and haloperidol.  Therefore, she was intubated for profound agitation and combativeness.   REVIEW OF SYSTEMS:   Un attainable  SUBJECTIVE:  No acute events in the night.    VITAL SIGNS: BP (!) 154/71   Pulse 67   Temp (!) 101.1 F (38.4 C)   Resp (!) 24   Ht 5\' 6"  (1.676 m)   Wt 165 lb 12.6 oz (75.2 kg)   LMP  (LMP Unknown)   SpO2 100%   BMI 26.76 kg/m   HEMODYNAMICS:  no compromise  VENTILATOR SETTINGS: Vent Mode: PRVC FiO2 (%):  [30 %] 30 % Set Rate:  [20 bmp] 20 bmp Vt Set:  [450 mL] 450 mL PEEP:  [5 cmH20] 5 cmH20 Plateau Pressure:  [11 cmH20-16 cmH20] 16 cmH20  INTAKE / OUTPUT: I/O last 3 completed shifts: In: 3641.2 [I.V.:1541.2; NG/GT:2100] Out: 2145 [Urine:2145]  PHYSICAL EXAMINATION: General: more awake off Propofol Neuro: PERRLA, EOMI, corneal reflexes intact, followed all commands HEENT: NCAT, sclerae white Cardiovascular: Regular, no M Lungs: Clear to auscultation anteriorly Abdomen: Soft, + BS, NT Extremities: Warm, no edema Skin: No lesions noted   LABS:  BMET Recent Labs  Lab 12/22/17 0658 12/23/17 0436 12/24/17 0557  NA 140 141 144  K 3.5 3.4* 3.5  CL 110 111 111  CO2 25 24 27   BUN 16 15 27*   CREATININE 0.65 0.57 0.62  GLUCOSE 130* 257* 152*    Electrolytes Recent Labs  Lab 12/22/17 0658 12/23/17 0436 12/24/17 0557  CALCIUM 7.4* 8.6* 8.2*  MG  --  1.8 2.0  PHOS  --  1.9* 3.2    CBC Recent Labs  Lab 12/20/17 0155 12/22/17 0658  WBC 10.3 7.9  HGB 13.5 11.7*  HCT 39.9 34.5*  PLT 358 230    Coag's Recent Labs  Lab 12/21/17 0414  INR 0.88    Sepsis Markers Recent Labs  Lab 12/21/17 0414 12/21/17 0738 12/21/17 1424 12/24/17 0557  LATICACIDVEN 2.1* 2.1*  --  1.2  PROCALCITON  --   --  <0.10  --     ABG Recent Labs  Lab 12/21/17 0515  PHART 7.33*  PCO2ART 52*  PO2ART 124*    Liver Enzymes Recent Labs  Lab 12/20/17 0155 12/24/17 0557  AST 27 27  ALT 21 51  ALKPHOS 108 82  BILITOT 0.2* 0.5  ALBUMIN 3.4* 2.5*    Cardiac Enzymes No results for input(s): TROPONINI, PROBNP in the last 168 hours.  Glucose Recent Labs  Lab 12/23/17 1516 12/23/17 1943 12/24/17 0039 12/24/17 0424 12/24/17 0708 12/24/17 1117  GLUCAP 203* 157* 167* 149* 123* 159*    Imaging No results found.   STUDIES:  nil  CULTURES: MRSA- negative  ANTIBIOTICS: None  SIGNIFICANT EVENTS: Patient became tachypneic  and agitated with upward gaze during SBT   LINES/TUBES: Peripheral IVs  DISCUSSION: This 57 year old lady was admitted with combativeness and thought to be withdrawing from Benzodiazepine.  ASSESSMENT / PLAN:  1. Acute respiratory failure secondary to airway compromise. Patient much more cooperative today after restarting all home meds except for Remeron and D/C of steroids 2. Acute encephalopathy thought to be from poly pharmacy withdrawals delirium 3. Hypokalemia 4. Hx of Anxiety disorder on multiple psychotropic medications  Plan: 1. Vent wean and extubate 2. Electrolytes replacement replacement 3. GI and DVT prophylaxis 4. Re-evaluate need for MRI in Am  FAMILY  - Updates: Husband and sister updated.    I have dedicated a  total of 35 minutes in critical care time minus all appropriate exclusions.   Jackson Latino, MD Pulmonary and Critical Care Medicine [facility] Pager: [phone]  12/24/2017, 11:52 AM

## 2017-12-24 NOTE — Progress Notes (Signed)
Patient ID: Karen Chen, female   DOB: 1961/02/09, 57 y.o.   MRN: [id number]  [employer] PROGRESS NOTE  Karen Chen [facility] [id number] DOB: 1960-11-23 DOA: 12/21/2017 PCP: Ronnell Freshwater, NP  HPI/Subjective: With weaning trial today the patient became very agitated.  Patient is sedated and intubated and unable to provide any history at this time. Patient ended up calling 911 herself.  Husband was sleeping at the time.  She could not tell me why she called 911.  States that she has bad restless leg syndrome.  Objective: Vitals:   12/24/17 0800 12/24/17 0900  BP: (!) 163/89 (!) 154/71  Pulse: (!) 59 67  Resp: 19 (!) 24  Temp: (!) 101.1 F (38.4 C) (!) 101.1 F (38.4 C)  SpO2: 99% 100%    Filed Weights   12/22/17 0334 12/23/17 0342 12/24/17 0420  Weight: 70.1 kg (154 lb 8.7 oz) 70.9 kg (156 lb 4.9 oz) 75.2 kg (165 lb 12.6 oz)    ROS: Review of Systems  Constitutional: Positive for fever. Negative for chills.  Respiratory: Negative for shortness of breath.   Cardiovascular: Negative for chest pain.  Gastrointestinal: Negative for abdominal pain, diarrhea, nausea and vomiting.  Musculoskeletal: Positive for myalgias.  Neurological: Negative for dizziness.   Exam: Physical Exam  Constitutional: She is intubated.  HENT:  Nose: No mucosal edema.  Eyes: Pupils are equal, round, and reactive to light. Conjunctivae and lids are normal.  Neck: Carotid bruit is not present. No thyroid mass present.  Cardiovascular: S1 normal, S2 normal and normal heart sounds.  Pulses:      Dorsalis pedis pulses are 1+ on the right side, and 1+ on the left side.  Respiratory: She is intubated. She has decreased breath sounds in the right lower field and the left lower field. She has no wheezes. She has rhonchi in the right lower field and the left lower field. She has no rales.  GI: Soft. Bowel sounds are normal. There is no tenderness.  Musculoskeletal:       Right ankle: She  exhibits no swelling.       Left ankle: She exhibits no swelling.  Lymphadenopathy:    She has no cervical adenopathy.  Neurological: She is alert. No cranial nerve deficit.  Skin: Skin is warm. No rash noted. Nails show no clubbing.  Psychiatric: She has a normal mood and affect.      Data Reviewed: Basic Metabolic Panel: Recent Labs  Lab 12/20/17 0155 12/22/17 0658 12/23/17 0436 12/24/17 0557  NA 138 140 141 144  K 3.4* 3.5 3.4* 3.5  CL 103 110 111 111  CO2 '27 25 24 27  '$ GLUCOSE 329* 130* 257* 152*  BUN 22* 16 15 27*  CREATININE 0.86 0.65 0.57 0.62  CALCIUM 8.6* 7.4* 8.6* 8.2*  MG  --   --  1.8 2.0  PHOS  --   --  1.9* 3.2   Liver Function Tests: Recent Labs  Lab 12/20/17 0155 12/24/17 0557  AST 27 27  ALT 21 51  ALKPHOS 108 82  BILITOT 0.2* 0.5  PROT 6.7 5.7*  ALBUMIN 3.4* 2.5*   CBC: Recent Labs  Lab 12/20/17 0155 12/22/17 0658  WBC 10.3 7.9  NEUTROABS 8.4* 5.7  HGB 13.5 11.7*  HCT 39.9 34.5*  MCV 93.9 95.5  PLT 358 230    CBG: Recent Labs  Lab 12/23/17 1943 12/24/17 0039 12/24/17 0424 12/24/17 0708 12/24/17 1117  GLUCAP 157* 167* 149* 123* 159*  Recent Results (from the past 240 hour(s))  Blood Culture (routine x 2)     Status: None (Preliminary result)   Collection Time: 12/21/17  4:14 AM  Result Value Ref Range Status   Specimen Description BLOOD BLOOD LEFT FOREARM  Final   Special Requests   Final    BOTTLES DRAWN AEROBIC AND ANAEROBIC Blood Culture results may not be optimal due to an excessive volume of blood received in culture bottles   Culture   Final    NO GROWTH 3 DAYS Performed at [facility], [address] [place], [place] [address]    Report Status PENDING  Incomplete  Blood Culture (routine x 2)     Status: None (Preliminary result)   Collection Time: 12/21/17  4:14 AM  Result Value Ref Range Status   Specimen Description BLOOD BLOOD LEFT WRIST  Final   Special Requests   Final    BOTTLES DRAWN AEROBIC  AND ANAEROBIC Blood Culture results may not be optimal due to an inadequate volume of blood received in culture bottles   Culture   Final    NO GROWTH 3 DAYS Performed at [facility], [address] [place], [place] [address]    Report Status PENDING  Incomplete  Culture, respiratory (NON-Expectorated)     Status: None   Collection Time: 12/21/17  5:50 AM  Result Value Ref Range Status   Specimen Description   Final    TRACHEAL ASPIRATE Performed at [facility], [address], [place], [place] [address]    Special Requests   Final    Normal Performed at [facility], [address] [place], [place] [address]    Gram Stain   Final    MODERATE WBC PRESENT,BOTH PMN AND MONONUCLEAR RARE SQUAMOUS EPITHELIAL CELLS PRESENT MODERATE GRAM POSITIVE COCCI IN PAIRS IN CHAINS RARE GRAM POSITIVE RODS    Culture   Final    Consistent with normal respiratory flora. Performed at [facility], [place] [address], [place], [place] [address]    Report Status 12/23/2017 FINAL  Final  MRSA PCR Screening     Status: None   Collection Time: 12/21/17  8:42 AM  Result Value Ref Range Status   MRSA by PCR NEGATIVE NEGATIVE Final    Comment:        The GeneXpert MRSA Assay (FDA approved for NASAL specimens only), is one component of a comprehensive MRSA colonization surveillance program. It is not intended to diagnose MRSA infection nor to guide or monitor treatment for MRSA infections. Performed at [facility], [address] [place], [place] [address]   Culture, blood (Routine X 2) w Reflex to ID Panel     Status: None (Preliminary result)   Collection Time: 12/24/17  5:57 AM  Result Value Ref Range Status   Specimen Description BLOOD LEFT HAND  Final   Special Requests   Final    BOTTLES DRAWN AEROBIC AND ANAEROBIC Blood Culture adequate volume   Culture   Final    NO GROWTH < 12 HOURS Performed at [facility], [address] [place], [place] [address]    Report Status PENDING  Incomplete  Culture, blood (Routine X 2) w Reflex to ID Panel     Status: None (Preliminary result)   Collection Time: 12/24/17  7:22 AM  Result Value Ref Range Status   Specimen Description BLOOD LT HAND  Final   Special Requests   Final  BOTTLES DRAWN AEROBIC AND ANAEROBIC Blood Culture adequate volume   Culture   Final    NO GROWTH <12 HOURS Performed at [facility], [address] [place], [place] [address]    Report Status PENDING  Incomplete     Studies: Dg Chest Port 1 View  Result Date: 12/23/2017 CLINICAL DATA:  Respiratory failure EXAM: PORTABLE CHEST 1 VIEW COMPARISON:  12/21/2017 FINDINGS: Endotracheal tube advanced. Tip is 4.0 cm from the carina. Stable NG tube. Bilateral basilar hazy airspace disease versus atelectasis. Normal heart size. No pneumothorax. IMPRESSION: Bibasilar hazy airspace disease versus atelectasis. Electronically Signed   By: Marybelle Killings M.D.   On: 12/23/2017 10:11    Scheduled Meds: . albuterol  2.5 mg Nebulization Q6H  . aspirin  81 mg Per Tube Daily  . chlorhexidine gluconate (MEDLINE KIT)  15 mL Mouth Rinse BID  . diazepam  5 mg Per Tube BID  . docusate  100 mg Per Tube Daily  . enoxaparin (LOVENOX) injection  40 mg Subcutaneous Q24H  . fentaNYL  50 mcg Transdermal Q72H  . gabapentin  600 mg Oral BID  . insulin aspart  0-24 Units Subcutaneous Q4H  . insulin glargine  10 Units Subcutaneous QHS  . mouth rinse  15 mL Mouth Rinse Q2H  . pantoprazole sodium  40 mg Per Tube Daily  . potassium chloride  40 mEq Per NG tube Once  . rOPINIRole  2 mg Oral BID   Continuous Infusions: . sodium chloride    . dexmedetomidine (PRECEDEX) IV infusion 1 mcg/kg/hr (12/24/17 0226)  . feeding supplement (VITAL AF 1.2 CAL) 60 mL/hr at 12/23/17 0600  . norepinephrine (LEVOPHED) Adult infusion Stopped (12/22/17 0302)  . propofol (DIPRIVAN) infusion 16 mcg/kg/min (12/24/17 0147)     Assessment/Plan:  1. Acute metabolic encephalopathy with agitation.  Initial CT scan of the brain negative.  Patient extubated this morning.  Still on a little low-dose Precedex.  And her usual medications except for Remeron.  Patient was restarted on low-dose Valium.  EEG negative for seizure.   MRI of the brain was ordered.  Patient improved. 2. Fever.  Case discussed with critical care specialist.  Yesterday's chest x-ray possible starting of a pneumonia.  Consider antibiotics. 3. Acute hypoxic respiratory failure.  Patient intubated for airway protection.  Extubated this morning and is on room air. 4. Hypokalemia, hypomagnesemia and hypophosphatemia.  Replaced 5. Essential hypertension hydrochlorothiazide on hold 6. Chronic low back pain, restless leg syndrome, RA and anxiety.  Patient restarted on her usual meds except for Remeron which was the new one.  Placed back on Requip. 7. Type 2 diabetes mellitus with hemoglobin A1c of 8.0.  Patient on sliding scale and low-dose Lantus  Code Status:     Code Status Orders  (From admission, onward)        Start     Ordered   12/21/17 0841  Full code  Continuous     12/21/17 0840    Code Status History    Date Active Date Inactive Code Status Order ID Comments User Context   09/16/2016 0346 09/16/2016 2114 Full Code [id number]  Harvie Bridge, DO ED     Family Communication:  husband and best friend at the bedside Disposition Plan:  to be determined  Consultants:  Critical care specialist  Time spent: [place]

## 2017-12-25 DIAGNOSIS — J9600 Acute respiratory failure, unspecified whether with hypoxia or hypercapnia: Secondary | ICD-10-CM

## 2017-12-25 DIAGNOSIS — G9341 Metabolic encephalopathy: Secondary | ICD-10-CM

## 2017-12-25 DIAGNOSIS — F19931 Other psychoactive substance use, unspecified with withdrawal delirium: Secondary | ICD-10-CM

## 2017-12-25 LAB — BASIC METABOLIC PANEL
ANION GAP: 10 (ref 5–15)
BUN: 17 mg/dL (ref 6–20)
CO2: 26 mmol/L (ref 22–32)
Calcium: 8.5 mg/dL — ABNORMAL LOW (ref 8.9–10.3)
Chloride: 109 mmol/L (ref 101–111)
Creatinine, Ser: 0.51 mg/dL (ref 0.44–1.00)
GFR calc Af Amer: >60 mL/min (ref >60)
Glucose, Bld: 86 mg/dL (ref 65–99)
POTASSIUM: 4.3 mmol/L (ref 3.5–5.1)
Sodium: 145 mmol/L (ref 135–145)

## 2017-12-25 LAB — URINE CULTURE: CULTURE: NO GROWTH

## 2017-12-25 LAB — GLUCOSE, CAPILLARY
GLUCOSE-CAPILLARY: 83 mg/dL (ref 65–99)
GLUCOSE-CAPILLARY: 88 mg/dL (ref 65–99)
Glucose-Capillary: 83 mg/dL (ref 65–99)
Glucose-Capillary: 87 mg/dL (ref 65–99)
Glucose-Capillary: 85 mg/dL (ref 65–99)
Glucose-Capillary: 190 mg/dL — ABNORMAL HIGH (ref 65–99)

## 2017-12-25 MED ORDER — INSULIN ASPART 100 UNIT/ML ~~LOC~~ SOLN
0.0000 [IU] | Freq: Three times a day (TID) | SUBCUTANEOUS | Status: DC
  Administered 2017-12-26: 1 [IU] via SUBCUTANEOUS
  Filled 2017-12-25: qty 1

## 2017-12-25 MED ORDER — PREDNISONE 5 MG PO TABS
5.0000 mg | ORAL_TABLET | Freq: Two times a day (BID) | ORAL | Status: DC
  Administered 2017-12-25 – 2017-12-26 (×2): 5 mg via ORAL
  Filled 2017-12-25 (×3): qty 1

## 2017-12-25 MED ORDER — AMOXICILLIN-POT CLAVULANATE 875-125 MG PO TABS
1.0000 | ORAL_TABLET | Freq: Two times a day (BID) | ORAL | Status: DC
  Administered 2017-12-25 – 2017-12-26 (×3): 1 via ORAL
  Filled 2017-12-25 (×4): qty 1

## 2017-12-25 MED ORDER — DOCUSATE SODIUM 100 MG PO CAPS
100.0000 mg | ORAL_CAPSULE | Freq: Every day | ORAL | Status: DC
  Administered 2017-12-25 – 2017-12-26 (×2): 100 mg via ORAL
  Filled 2017-12-25 (×2): qty 1

## 2017-12-25 MED ORDER — PANTOPRAZOLE SODIUM 40 MG PO TBEC
40.0000 mg | DELAYED_RELEASE_TABLET | Freq: Every day | ORAL | Status: DC
  Administered 2017-12-25 – 2017-12-26 (×2): 40 mg via ORAL
  Filled 2017-12-25 (×2): qty 1

## 2017-12-25 MED ORDER — INSULIN ASPART 100 UNIT/ML ~~LOC~~ SOLN
0.0000 [IU] | Freq: Every day | SUBCUTANEOUS | Status: DC
Start: 2017-12-25 — End: 2017-12-26

## 2017-12-25 MED ORDER — ADULT MULTIVITAMIN W/MINERALS CH
1.0000 | ORAL_TABLET | Freq: Every day | ORAL | Status: DC
  Administered 2017-12-26: 1 via ORAL
  Filled 2017-12-25: qty 1

## 2017-12-25 MED ORDER — ASPIRIN 81 MG PO CHEW: 81.0000 mg | CHEWABLE_TABLET | Freq: Every day | ORAL | Status: DC

## 2017-12-25 MED ORDER — ACETAMINOPHEN 160 MG/5ML PO SOLN
325.0000 mg | Freq: Four times a day (QID) | ORAL | Status: DC | PRN
  Filled 2017-12-25: qty 20.3

## 2017-12-25 MED ORDER — SODIUM CHLORIDE 0.9 % IV BOLUS
1000.0000 mL | Freq: Once | INTRAVENOUS | Status: AC
  Administered 2017-12-25: 1000 mL via INTRAVENOUS

## 2017-12-25 MED ORDER — ASPIRIN 81 MG PO CHEW
81.0000 mg | CHEWABLE_TABLET | Freq: Every day | ORAL | Status: DC
  Administered 2017-12-25 – 2017-12-26 (×2): 81 mg via ORAL
  Filled 2017-12-25 (×2): qty 1

## 2017-12-25 NOTE — Progress Notes (Signed)
Patient ID: Karen Chen, female   DOB: [birth year], 57 y.o.   MRN: [id number]  [employer] PROGRESS NOTE  Karen Chen DOB: 06-08-1961 DOA: 12/21/2017 PCP: Carlean Jews, NP  HPI/Subjective: Patient still having a problem with her restless legs.  Feeling better today.  Cough.  Had fever for the past couple days.  Objective: Vitals:   12/25/17 1100 12/25/17 1200  BP: (!) 153/79 (!) 162/75  Pulse: 70   Resp: 13 14  Temp:  98.3 F (36.8 C)  SpO2: 100%     Filed Weights   12/23/17 0342 12/24/17 0420 12/25/17 1100  Weight: 70.9 kg (156 lb 4.9 oz) 75.2 kg (165 lb 12.6 oz) 67.7 kg (149 lb 4 oz)    ROS: Review of Systems  Constitutional: Positive for fever. Negative for chills.  Respiratory: Negative for shortness of breath.   Cardiovascular: Negative for chest pain.  Gastrointestinal: Negative for abdominal pain, diarrhea, nausea and vomiting.  Musculoskeletal: Positive for myalgias.  Neurological: Negative for dizziness.   Exam: Physical Exam  HENT:  Nose: No mucosal edema.  Eyes: Pupils are equal, round, and reactive to light. Conjunctivae and lids are normal.  Neck: Carotid bruit is not present. No thyroid mass present.  Cardiovascular: S1 normal, S2 normal and normal heart sounds.  Pulses:      Dorsalis pedis pulses are 1+ on the right side, and 1+ on the left side.  Respiratory: She has decreased breath sounds in the right lower field and the left lower field. She has no wheezes. She has rhonchi in the right lower field and the left lower field. She has no rales.  GI: Soft. Bowel sounds are normal. There is no tenderness.  Musculoskeletal:       Right ankle: She exhibits no swelling.       Left ankle: She exhibits no swelling.  Lymphadenopathy:    She has no cervical adenopathy.  Neurological: She is alert. No cranial nerve deficit.  Skin: Skin is warm. No rash noted. Nails show no clubbing.  Psychiatric: She has a normal mood and  affect.      Data Reviewed: Basic Metabolic Panel: Recent Labs  Lab 12/20/17 0155 12/22/17 0658 12/23/17 0436 12/24/17 0557 12/25/17 0504  NA 138 140 141 144 145  K 3.4* 3.5 3.4* 3.5 4.3  CL 103 110 111 111 109  CO2 27 25 24 27 26   GLUCOSE 329* 130* 257* 152* 86  BUN 22* 16 15 27* 17  CREATININE 0.86 0.65 0.57 0.62 0.51  CALCIUM 8.6* 7.4* 8.6* 8.2* 8.5*  MG  --   --  1.8 2.0  --   PHOS  --   --  1.9* 3.2  --    Liver Function Tests: Recent Labs  Lab 12/20/17 0155 12/24/17 0557  AST 27 27  ALT 21 51  ALKPHOS 108 82  BILITOT 0.2* 0.5  PROT 6.7 5.7*  ALBUMIN 3.4* 2.5*   CBC: Recent Labs  Lab 12/20/17 0155 12/22/17 0658  WBC 10.3 7.9  NEUTROABS 8.4* 5.7  HGB 13.5 11.7*  HCT 39.9 34.5*  MCV 93.9 95.5  PLT 358 230    CBG: Recent Labs  Lab 12/24/17 1513 12/24/17 1949 12/25/17 0020 12/25/17 0418 12/25/17 0740  GLUCAP 170* 77 83 83 87    Recent Results (from the past 240 hour(s))  Blood Culture (routine x 2)     Status: None (Preliminary result)   Collection Time: 12/21/17  4:14 AM  Result Value  Ref Range Status   Specimen Description BLOOD BLOOD LEFT FOREARM  Final   Special Requests   Final    BOTTLES DRAWN AEROBIC AND ANAEROBIC Blood Culture results may not be optimal due to an excessive volume of blood received in culture bottles   Culture   Final    NO GROWTH 3 DAYS Performed at [facility], [address], [place], [place] [address]    Report Status PENDING  Incomplete  Blood Culture (routine x 2)     Status: None (Preliminary result)   Collection Time: 12/21/17  4:14 AM  Result Value Ref Range Status   Specimen Description BLOOD BLOOD LEFT WRIST  Final   Special Requests   Final    BOTTLES DRAWN AEROBIC AND ANAEROBIC Blood Culture results may not be optimal due to an inadequate volume of blood received in culture bottles   Culture   Final    NO GROWTH 3 DAYS Performed at [facility], [address] [place],  [place] [address]    Report Status PENDING  Incomplete  Culture, respiratory (NON-Expectorated)     Status: None   Collection Time: 12/21/17  5:50 AM  Result Value Ref Range Status   Specimen Description   Final    TRACHEAL ASPIRATE Performed at [facility], [address], [place], [place] [address]    Special Requests   Final    Normal Performed at [facility], [address] [place], [place] [address]    Gram Stain   Final    MODERATE WBC PRESENT,BOTH PMN AND MONONUCLEAR RARE SQUAMOUS EPITHELIAL CELLS PRESENT MODERATE GRAM POSITIVE COCCI IN PAIRS IN CHAINS RARE GRAM POSITIVE RODS    Culture   Final    Consistent with normal respiratory flora. Performed at [facility], [address] [place], [place] [address]    Report Status 12/23/2017 FINAL  Final  MRSA PCR Screening     Status: None   Collection Time: 12/21/17  8:42 AM  Result Value Ref Range Status   MRSA by PCR NEGATIVE NEGATIVE Final    Comment:        The GeneXpert MRSA Assay (FDA approved for NASAL specimens only), is one component of a comprehensive MRSA colonization surveillance program. It is not intended to diagnose MRSA infection nor to guide or monitor treatment for MRSA infections. Performed at [facility], [address] [place], [place] [address]   Culture, respiratory (NON-Expectorated)     Status: None (Preliminary result)   Collection Time: 12/24/17  5:09 AM  Result Value Ref Range Status   Specimen Description   Final    TRACHEAL ASPIRATE Performed at [facility], [address] [place], [place] [address]    Special Requests   Final    NONE Performed at [facility], [address] [place], [place] [address]    Gram Stain   Final    NO SQUAMOUS EPITHELIAL CELLS PRESENT RARE WBC PRESENT, PREDOMINANTLY MONONUCLEAR FEW YEAST    Culture   Final    CULTURE REINCUBATED FOR BETTER GROWTH Performed at [facility], [address], [place], [place] [address]    Report Status PENDING  Incomplete  Urine Culture     Status: None   Collection Time: 12/24/17  5:13 AM  Result Value Ref Range Status   Specimen Description   Final    URINE, RANDOM Performed at [facility], [address] [place], [place]  [address]    Special Requests   Final    NONE Performed at [facility], [address] [place], [place] [address]    Culture   Final    NO GROWTH Performed at [facility], [address], [place], [place] [address]    Report Status 12/25/2017 FINAL  Final  Culture, blood (Routine X 2) w Reflex to ID Panel     Status: None (Preliminary result)   Collection Time: 12/24/17  5:57 AM  Result Value Ref Range Status   Specimen Description BLOOD LEFT HAND  Final   Special Requests   Final    BOTTLES DRAWN AEROBIC AND ANAEROBIC Blood Culture adequate volume   Culture   Final    NO GROWTH < 12 HOURS Performed at [facility], [address] [place], [place] [address]    Report Status PENDING  Incomplete  Culture, blood (Routine X 2) w Reflex to ID Panel     Status: None (Preliminary result)   Collection Time: 12/24/17  7:22 AM  Result Value Ref Range Status   Specimen Description BLOOD LT HAND  Final   Special Requests   Final    BOTTLES DRAWN AEROBIC AND ANAEROBIC Blood Culture adequate volume   Culture   Final    NO GROWTH <12 HOURS Performed at [facility], [address], [place], [place] [address]    Report Status PENDING  Incomplete     Studies: No results found.  Scheduled Meds: . albuterol  2.5 mg Nebulization Q6H  . amoxicillin-clavulanate  1 tablet Oral Q12H  . aspirin  81 mg Oral Daily  . docusate sodium  100 mg Oral Daily  . enoxaparin (LOVENOX) injection  40 mg Subcutaneous Q24H  . fentaNYL  50 mcg Transdermal Q72H  . gabapentin  600 mg Oral BID  . insulin aspart  0-24 Units Subcutaneous Q4H  . pantoprazole  40 mg Oral Daily  . predniSONE   5 mg Oral BID WC  . rOPINIRole  2 mg Oral BID   Continuous Infusions: . sodium chloride    . sodium chloride 50 mL/hr at 12/25/17 1200    Assessment/Plan:  1. Acute metabolic encephalopathy with agitation.  This has improved.  On all of her usual medications except for Remeron which was discontinued.  Restart her low-dose prednisone  twice a day. 2. Fever.  Likely pneumonia from being on the ventilator.  Start Augmentin. 3. Acute hypoxic respiratory failure.  this has resolved and breathing comfortably on room air. 4. Hypokalemia, hypomagnesemia and hypophosphatemia.  Replaced 5. Essential hypertension hydrochlorothiazide on hold. 6. Chronic low back pain, restless leg syndrome, RA and anxiety.  Patient restarted on her usual meds except for Remeron which was the new one.  Placed back on Requip.  Patient states that the restless leg syndrome is awful. 7. Type 2 diabetes mellitus with hemoglobin A1c of 8.0.  Patient on sliding scale.  Lantus sugar today much improved. 8. Patient did well with swallowing today except for the thin liquids.  This will be retried tomorrow. 9. Physical therapy evaluation  Code Status:     Code Status Orders  (From admission, onward)        Start     Ordered   12/21/17 0841  Full code  Continuous     12/21/17 0840    Code Status History    Date Active Date Inactive Code Status Order ID Comments User Context   09/16/2016 0346 09/16/2016 2114  Full Code [id number]  Hugelmeyer, Jon Gills, DO ED     Family Communication: Daughter and grandchild at bedside Disposition Plan: Hopefully will not have to stay in the hospital too long  Consultants:  Critical care specialist  Time spent: 26 Minutes  Karen Chen [employer]

## 2017-12-25 NOTE — Evaluation (Signed)
Clinical/Bedside Swallow Evaluation Patient Details  Name: Karen Chen MRN: [id number] Date of Birth: 30-Jun-1961  Today's Date: 12/25/2017 Time: SLP Start Time (ACUTE ONLY): 0900 SLP Stop Time (ACUTE ONLY): 1000 SLP Time Calculation (min) (ACUTE ONLY): 60 min  Past Medical History:  Past Medical History:  Diagnosis Date  . Anxiety   . Chronic lower back pain   . Hypertension   . Restless leg   . Rheumatoid arthritis [facility])    Past Surgical History:  Past Surgical History:  Procedure Laterality Date  . BACK SURGERY  1980   HPI:  Pt  is a 57 y.o. female with a known history of HTN, RA, ADHD, PTSD, anx/dep, RLS who p/w 2d Hx agitation, culminating in frank encephalopathy in ED, ultimately necessitating intubation. Pt is intubated at the time of my Hx/examination. Hx is obtained from pt's husband and sister, who are at bedside. They state that the pt has a Hx of severe Restless Leg Syndrome, and has been on multiple medications for this condition in the past. Her family believes she has been on Requip for some time now (they claim > 64yr), but they state she was recently started on a new medication, Valium, recently. She was previously on a combination of Xanax and Requip, but her Pain Clinic stopped her Xanax and changed it to Valium. It is reported that the Requip and Valium were subsequently stopped by pt's PCP ~2d ago. Since that time, it is reported that pt has been experiencing progressively worsening agitation. Pt was reportedly combative/violent in ED, hitting/kicking and generally being a danger to herself and others. Pt was reportedly twitching and having spasms in the ED. There were no reports of F/C/N/V/D/AP, CP, SOB, palpitations, diaphoresis, rigors, night sweats, HA, blurred vision, vertigo, LH, LOC, urinary symptoms. ROS otherwise unobtainable, as pt intubated. Respiratory Therapy reports thick secretions. Pt is an active smoker (> 1ppd). Pt was orally intubated d/t airway  concern; extubated on 12/24/17. Currently, NPO.    Assessment / Plan / Recommendation Clinical Impression  Pt appears to present w/ mild s/s of oropharyngeal phase dysphagia, moreso min, overt s/s of aspiration (delayed cough) w/ trials of thin liquids via Cup. The delayed cough was not noted w/ trials of Nectar liquids via Cup. Unsure if there is increased sensitivity to the thin liquids d/t recent oral intubation - possibly delay in pharyngeal swallow initiation. Pt appeared to better manage trials of the Nectar consistency liquids, purees and soft solids. No significant oral phase deficits noted - timely bolus management and complete oral clearing occurred w/ each trial consistency. However, pt only took a few trials of the soft solids c/b lack of desire and fatigue. No Oral Motor weakness was appreciated during OM movements or w/ bolus management/clearing.  Due to pt's min increased risk for aspiration and recent oral intubation, recommend a modified diet w/ Nectar consistency liquids and aspiration precautions to help reduce risk from oropharyngeal phase dysphagia. Recommend Pills in Puree - Crushed as able or in liquid form mixed in Nectar liquid. Family reported that pt is easily anxious so this diet consistency should help her ease back into po intake w/ less risk. ST services will f/u next 1-2 days w/ toleration of diet and trials to upgrade diet consistency as appopriate. Family updated and agreed.  SLP Visit Diagnosis: Dysphagia, oropharyngeal phase (R13.12)    Aspiration Risk  Mild aspiration risk    Diet Recommendation  Dysphagia level 2(MINCED foods) w/ NECTAR consistency liquids; aspiration precautions; support  at meals w/ reduced distractions  Medication Administration: Crushed with puree(or in liquid form mixed in Nectar or puree)    Other  Recommendations Recommended Consults: (Dietician f/u) Oral Care Recommendations: Oral care BID;Staff/trained caregiver to provide oral  care;Patient independent with oral care Other Recommendations: Order thickener from pharmacy;Prohibited food (jello, ice cream, thin soups);Remove water pitcher;Have oral suction available   Follow up Recommendations (TBD)      Frequency and Duration min 3x week  2 weeks       Prognosis Prognosis for Safe Diet Advancement: Good      Swallow Study   General Date of Onset: 12/21/17 HPI: Pt  is a 57 y.o. female with a known history of HTN, RA, ADHD, PTSD, anx/dep, RLS who p/w 2d Hx agitation, culminating in frank encephalopathy in ED, ultimately necessitating intubation. Pt is intubated at the time of my Hx/examination. Hx is obtained from pt's husband and sister, who are at bedside. They state that the pt has a Hx of severe Restless Leg Syndrome, and has been on multiple medications for this condition in the past. Her family believes she has been on Requip for some time now (they claim > 68yr), but they state she was recently started on a new medication, Valium, recently. She was previously on a combination of Xanax and Requip, but her Pain Clinic stopped her Xanax and changed it to Valium. It is reported that the Requip and Valium were subsequently stopped by pt's PCP ~2d ago. Since that time, it is reported that pt has been experiencing progressively worsening agitation. Pt was reportedly combative/violent in ED, hitting/kicking and generally being a danger to herself and others. Pt was reportedly twitching and having spasms in the ED. There were no reports of F/C/N/V/D/AP, CP, SOB, palpitations, diaphoresis, rigors, night sweats, HA, blurred vision, vertigo, LH, LOC, urinary symptoms. ROS otherwise unobtainable, as pt intubated. Respiratory Therapy reports thick secretions. Pt is an active smoker (> 1ppd). Pt was orally intubated d/t airway concern; extubated on 12/24/17. Currently, NPO.  Type of Study: Bedside Swallow Evaluation Previous Swallow Assessment: none Diet Prior to this Study:  NPO(regular diet at home per family) Temperature Spikes Noted: No(wb 7.9) Respiratory Status: Room air History of Recent Intubation: Yes Length of Intubations (days): 4 days Date extubated: 12/24/17 Behavior/Cognition: Alert;Cooperative;Pleasant mood;Confused;Distractible;Requires cueing(easily anxious) Oral Cavity Assessment: Dry(sticky) Oral Care Completed by SLP: Recent completion by staff Oral Cavity - Dentition: Dentures, top;Dentures, bottom Vision: Functional for self-feeding Self-Feeding Abilities: Able to feed self;Needs assist;Needs set up;Total assist Patient Positioning: Upright in bed Baseline Vocal Quality: Normal Volitional Cough: Strong;Congested(min) Volitional Swallow: Able to elicit    Oral/Motor/Sensory Function Overall Oral Motor/Sensory Function: Within functional limits   Ice Chips Ice chips: Within functional limits Presentation: Spoon(fed; 5 trials)   Thin Liquid Thin Liquid: Impaired Presentation: Cup;Self Fed(monitored; 10 trials) Oral Phase Impairments: (none) Oral Phase Functional Implications: (none) Pharyngeal  Phase Impairments: Cough - Delayed(x4)    Nectar Thick Nectar Thick Liquid: Within functional limits Presentation: Cup;Self Fed(monitored; 8 trials)   Honey Thick Honey Thick Liquid: Not tested   Puree Puree: Within functional limits Presentation: Spoon;Self Fed(8 trials)   Solid   GO   Solid: Impaired Presentation: Self Fed(3 trials) Oral Phase Impairments: (fatigue w/ task; lack of desire) Pharyngeal Phase Impairments: (none) Other Comments: pt took only a few small, small bites of mech soft food then decline further       Jerilynn Som, MS, CCC-SLP [employer] 12/25/2017,8:31 PM

## 2017-12-25 NOTE — Progress Notes (Signed)
Nutrition Follow-up  DOCUMENTATION CODES:   Not applicable  INTERVENTION:   Vital Cuisine BID, each supplement provides 520kcal and 22g of protein. (chocolate)   Recommend MVI daily   NUTRITION DIAGNOSIS:   Increased nutrient needs related to acute illness as evidenced by increased estimated needs.  GOAL:   Patient will meet greater than or equal to 90% of their needs  MONITOR:   PO intake, Supplement acceptance, Diet advancement, Labs, Weight trends, I & O's  REASON FOR ASSESSMENT:   Other (Comment)(taken off vent, started on diet)   ASSESSMENT:  57 y.o. female with history of HTN, RA, ADHD, PTSD, anxiety, depression, restless leg syndrome, chronic lower back pain who presents to ED with dysphagia & tremor. Pt became very agitated in ED, she was admitted with encephalopathy and decision was made to intubate her.   4/26: Started on TF 4/27: Unsuccessful attempt at weaning off vent 4/28: NG/OGT removed; taken off ventilator 4/29 (today): Assigned Dysphagia 2 diet with Nectar Thick Liquids  Met with pt at bedside today. Three family members present at time of visit. At time of last visit, pt was intubated and sedated. Pt was alert and responsive during today's visit. Pt reports poor appetite at this time. She states that she doesn't like to eat. Pt was advanced from NPO to Dysphagia 2 this morning 4/29. She had not received food or eaten yet at time of visit.  PTA, pt reports not eating much during the day and snacking on junk foods (cookies, crackers, soda) at night time. Recommend MVI d/t lack of variety and nutrient-dense foods in patients diet.  Encouraged pt to eat despite decreased appetite to ensure she meets estimated calorie and protein needs. Pt verbalized understanding. Pt is willing to try vital cuisine (chocolate).  Noted pt's phos, mag, and potassium levels were checked and all are wnl. Patient is not at refeeding risk.  Medications reviewed and include:  aspirin, colace, enoxaparin, fentanyl patch, gabapentin, novolog, requip,NaCl bolus 1,000 ml, pantoprazole  Labs reviewed: K 4.3 wnl, CBG 77-170 x 24 hours 4/28: Phos 3.2 wnl, Mag 2.0 wnl  Weight trend: Per chart, pt's weight has remained stable since admit.Per chart, pt has stable weight fluctuating 4-6 lbs over the past 4 months.  Discussed pt with RN and on rounds. Plan is for pt to be moved to the floor.  Diet Order:  Seizure precautions DIET DYS 2 Room service appropriate? Yes with Assist; Fluid consistency: Nectar Thick Aspiration precautions  EDUCATION NEEDS:   No education needs have been identified at this time  Skin:  Skin Assessment: Reviewed RN Assessment  Last BM:  12/25/17- Type 5(per nurse)  Height:   Ht Readings from Last 1 Encounters:  12/21/17 '5\' 6"'$  (1.676 m)    Weight:   Wt Readings from Last 1 Encounters:  12/25/17 149 lb 4 oz (67.7 kg)    Ideal Body Weight:  59.1 kg  BMI:  Body mass index is 24.09 kg/m.  Estimated Nutritional Needs:   Kcal:  1650-1950 kcal (MSJ ABW x 1.3-1.5)  Protein:  81-95 gm/day (ABW x 1.2-1.4)  Fluid:  <1.6 L/day (1 mL/kcal)  Alfonse Ras, [place] Dietetic Intern [phone]

## 2017-12-25 NOTE — Evaluation (Signed)
Physical Therapy Evaluation Patient Details Name: Karen Chen MRN: [id number] DOB: 09-21-1960 Today's Date: 12/25/2017   History of Present Illness  Pt is a 57 y/o F who presented with agitation, culminating in encephalopathy in the ED, ultimately necessitating intubation.  Pt extubated 4/28.  Pt's PMH includes chronic LBP, RA, back surgery.      Clinical Impression  Pt admitted with above diagnosis. Pt currently with functional limitations due to the deficits listed below (see PT Problem List). [name] was independent with all aspects of mobility PTA.  She currently requires use of RW for transfers and to ambulate due to unsteadiness and weakness.  VSS throughout session.  Pt will have assist from husband for most of the day at d/c. Pt will benefit from skilled PT to increase their independence and safety with mobility to allow discharge to the venue listed below.      Follow Up Recommendations Home health PT;Supervision for mobility/OOB    Equipment Recommendations  None recommended by PT    Recommendations for Other Services       Precautions / Restrictions Precautions Precautions: Fall Restrictions Weight Bearing Restrictions: No      Mobility  Bed Mobility Overal bed mobility: Modified Independent             General bed mobility comments: No physical assist or cues needed. Slightly increased time required.   Transfers Overall transfer level: Needs assistance Equipment used: Rolling walker (2 wheeled);None Transfers: Sit to/from Stand Sit to Stand: Min guard         General transfer comment: Pt stood from bed without AD and reaches out for IV pole.  When attempting to take a few steps she continues to "furniture surf".  RW introduced and cues provided for proper hand placement and safe technique, no unsteadiness noted with introduction of RW.   Ambulation/Gait Ambulation/Gait assistance: Min guard Ambulation Distance (Feet): 80 Feet Assistive device:  Rolling walker (2 wheeled) Gait Pattern/deviations: Step-through pattern;Trunk flexed;Decreased stride length Gait velocity: decreased Gait velocity interpretation: <1.8 ft/sec, indicate of risk for recurrent falls General Gait Details: Pt with decreased gait speed but steady with introduction of RW.  Pt fatigues after ambulating 50 ft.  VSS throughout.  Cues for upright posture as pt demonstrates flexed posture (pt with h/o back pain, although not currently experiencing pain).   Stairs            Wheelchair Mobility    Modified Rankin (Stroke Patients Only)       Balance Overall balance assessment: Needs assistance Sitting-balance support: No upper extremity supported;Feet supported Sitting balance-Leahy Scale: Good     Standing balance support: Single extremity supported;During functional activity Standing balance-Leahy Scale: Poor Standing balance comment: Pt relies on at least 1UE support for static and dynamic activities                             Pertinent Vitals/Pain Pain Assessment: No/denies pain    Home Living Family/patient expects to be discharged to:: Private residence Living Arrangements: Spouse/significant other Available Help at Discharge: Family;Available PRN/intermittently(husband in and out during the day) Type of Home: House Home Access: Ramped entrance     Home Layout: One level Home Equipment: Walker - 4 wheels      Prior Function Level of Independence: Independent         Comments: Pt reports she ambulates without AD.  Is on disability.  Ind with all ADLs, IADLs.  Hand Dominance        Extremity/Trunk Assessment   Upper Extremity Assessment Upper Extremity Assessment: Overall WFL for tasks assessed    Lower Extremity Assessment Lower Extremity Assessment: (BLE strength grossly 4/5)       Communication   Communication: No difficulties  Cognition Arousal/Alertness: Awake/alert Behavior During Therapy: WFL  for tasks assessed/performed Overall Cognitive Status: Within Functional Limits for tasks assessed                                        General Comments      Exercises General Exercises - Lower Extremity Hip Flexion/Marching: Both;10 reps;Seated   Assessment/Plan    PT Assessment Patient needs continued PT services  PT Problem List Decreased strength;Decreased activity tolerance;Decreased balance;Decreased knowledge of use of DME;Decreased safety awareness       PT Treatment Interventions DME instruction;Gait training;Therapeutic exercise;Therapeutic activities;Functional mobility training;Balance training;Neuromuscular re-education;Patient/family education    PT Goals (Current goals can be found in the Care Plan section)  Acute Rehab PT Goals Patient Stated Goal: to return to PLOF PT Goal Formulation: With patient Time For Goal Achievement: 01/08/18 Potential to Achieve Goals: Good    Frequency Min 2X/week   Barriers to discharge        Co-evaluation               AM-PAC PT "6 Clicks" Daily Activity  Outcome Measure Difficulty turning over in bed (including adjusting bedclothes, sheets and blankets)?: None Difficulty moving from lying on back to sitting on the side of the bed? : None Difficulty sitting down on and standing up from a chair with arms (e.g., wheelchair, bedside commode, etc,.)?: A Little Help needed moving to and from a bed to chair (including a wheelchair)?: A Little Help needed walking in hospital room?: A Little Help needed climbing 3-5 steps with a railing? : A Little 6 Click Score: 20    End of Session Equipment Utilized During Treatment: Gait belt Activity Tolerance: Patient tolerated treatment well;Patient limited by fatigue Patient left: in bed;with call bell/phone within reach;with bed alarm set;Other (comment)(pt sitting EOB to eat late lunch) Nurse Communication: Mobility status;Other (comment)(VSS throughout ) PT  Visit Diagnosis: Muscle weakness (generalized) (M62.81);Other abnormalities of gait and mobility (R26.89)    Time: 9211-9417 PT Time Calculation (min) (ACUTE ONLY): 18 min   Charges:   PT Evaluation $PT Eval Low Complexity: 1 Low PT Treatments $Gait Training: 8-22 mins   PT G Codes:        Encarnacion Chu PT, DPT 12/25/2017, 3:13 PM

## 2017-12-25 NOTE — Progress Notes (Signed)
Pt being transferred to room 212. Report called to Terri, Charity fundraiser. Pt and belongings transferred to room 212 without incident.

## 2017-12-25 NOTE — Plan of Care (Signed)
Patient is alert and oriented.  Husband at bedside.  Patient has c/o being hungry.  NPO at this time until swallow evaluation.  No other concerns at this time.  Will continue to monitor.

## 2017-12-25 NOTE — Progress Notes (Signed)
Name: Marlenne Ridge MRN: [id number] DOB: October 11, 1960     CONSULTATION DATE: 12/21/2017  Subjective: No major issues last  Objectives: Awaiting swallowing evaluation. Low grade fever 100.6   PAST MEDICAL HISTORY :   has a past medical history of Anxiety, Chronic lower back pain, Hypertension, Restless leg, and Rheumatoid arthritis (HCC).  has a past surgical history that includes Back surgery (1980). Prior to Admission medications   Medication Sig Start Date End Date Taking? Authorizing Provider  aspirin 81 MG tablet Take 81 mg by mouth daily.   Yes [provider]  carbidopa-levodopa (SINEMET) 10-100 MG tablet Take 1 tablet by mouth 3 (three) times daily. 12/20/17  Yes Lyndon Code, MD  escitalopram (LEXAPRO) 20 MG tablet Take 2 tablets (40 mg total) by mouth daily. Take two tablets daily. Patient taking differently: Take 20 mg by mouth daily.  11/07/17  Yes Boscia, Kathlynn Grate, NP  gabapentin (NEURONTIN) 300 MG capsule Take 3 capsules at bed time. Patient taking differently: Take 600 mg by mouth 2 (two) times daily.  10/18/17  Yes Boscia, Kathlynn Grate, NP  mirtazapine (REMERON) 15 MG tablet Take one tab po qhs for sleep, anxiety Patient taking differently: Take 15 mg by mouth at bedtime.  12/20/17  Yes Lyndon Code, MD  oxyCODONE-acetaminophen (PERCOCET/ROXICET) 5-325 MG tablet Take 1 tablet by mouth every 8 (eight) hours as needed.    Yes [provider]  potassium chloride (K-DUR) 10 MEQ tablet Take 2 tablets (20 mEq total) by mouth daily. Take two tablets. 11/07/17  Yes Boscia, Kathlynn Grate, NP  predniSONE (DELTASONE) 5 MG tablet Take 5 mg by mouth 2 (two) times daily with a meal.   Yes [provider]  Calcium Carb-Cholecalciferol (CALCIUM PLUS D3 ABSORBABLE) [id number] MG-UNIT CAPS Take 1 capsule by mouth daily with breakfast. 04/24/17 07/23/17  Delano Metz, MD  clotrimazole-betamethasone (LOTRISONE) cream Apply 1 application topically 2 (two) times  daily. Patient not taking: Reported on 12/21/2017 08/23/17   Carlean Jews, NP  lidocaine (LIDODERM) 5 % Place 1 patch onto the skin every 12 (twelve) hours. Remove & Discard patch within 12 hours or as directed by MD Patient not taking: Reported on 08/23/2017 04/16/17 04/16/18  Rebecka Apley, MD  PROAIR HFA [phone] Base) MCG/ACT inhaler Inhale 90 mcg into the lungs 4 (four) times daily as needed. 1-2 puffs. 07/19/17   [provider]  rOPINIRole (REQUIP) 4 MG tablet Take 1 tablet (4 mg total) by mouth 2 (two) times daily. 2 tablets 11/07/17 12/20/17  Carlean Jews, NP   Allergies  Allergen Reactions  . Orencia [Abatacept] Anaphylaxis  . Sulfa Antibiotics Anaphylaxis  . Azathioprine Other (See Comments)  . Indocin [Indomethacin]   . Levaquin [Levofloxacin In D5w]   . Methotrexate Derivatives   . Prozac [Fluoxetine Hcl]   . Remicade [Infliximab] Hives  . Robaxin [Methocarbamol]   . Tizanidine   . Zanaflex [Tizanidine Hcl] Other (See Comments)    Patient states a burning sensation in her mouth 30 minutes after taking.  . Adalimumab Itching and Rash    FAMILY HISTORY:  family history includes Diabetes in her sister; Heart failure in her father; Hypertension in her father. SOCIAL HISTORY:  reports that she has been smoking.  She has a 35.00 pack-year smoking history. She has never used smokeless tobacco. She reports that she does not drink alcohol or use drugs.  REVIEW OF SYSTEMS:   Unable to obtain due to critical illness  VITAL SIGNS: Temp:  [98.6 F (37 C)-101.1 F (38.4 C)] 99.9 F (37.7 C) (04/29 0500) Pulse Rate:  [59-99] 67 (04/29 0500) Resp:  [15-28] 24 (04/29 0500) BP: (91-154)/(40-78) 147/64 (04/29 0600) SpO2:  [87 %-100 %] 97 % (04/29 0500)  Physical Examination:  -A + O and no acute neuro deficits -On RA, no distress, able to talk in full sentences. BEAE and no rales -S1 & S2 audible and no murmur -benign abdomen with normal peristalses -Ext:  WNL and no edema  ASSESSMENT / PLAN:   Acute respiratory failure secondary to airway compromise (resolved).  Acute encephalopathy due to poly pharmacy withdrawals delirium (resolved). Base line history of anxiety on antipsychotics. -Optimize antipsychotics.   Atelectasis with questionable aspiration. Bibasilar airspace disease -Monitor CXR + CBC + FIo2 requirement and consider starting ABX if develops SIRS.  Dysphagia -Swallowing evaluation  Full code  Supportive care  Critical care time 35 min

## 2017-12-26 LAB — CBC
HCT: 36.7 % (ref 35.0–47.0)
Hemoglobin: 12.4 g/dL (ref 12.0–16.0)
MCH: 32.1 pg (ref 26.0–34.0)
MCHC: 33.9 g/dL (ref 32.0–36.0)
MCV: 94.8 fL (ref 80.0–100.0)
PLATELETS: 280 10*3/uL (ref 150–440)
RBC: 3.87 MIL/uL (ref 3.80–5.20)
RDW: 13.9 % (ref 11.5–14.5)
WBC: 10 10*3/uL (ref 3.6–11.0)

## 2017-12-26 LAB — CULTURE, BLOOD (ROUTINE X 2)
CULTURE: NO GROWTH
CULTURE: NO GROWTH

## 2017-12-26 LAB — GLUCOSE, CAPILLARY
GLUCOSE-CAPILLARY: 129 mg/dL — AB (ref 65–99)
GLUCOSE-CAPILLARY: 117 mg/dL — AB (ref 65–99)

## 2017-12-26 MED ORDER — ROPINIROLE HCL 2 MG PO TABS: 2.0000 mg | ORAL_TABLET | Freq: Two times a day (BID) | ORAL | 0 refills | Status: DC

## 2017-12-26 MED ORDER — GABAPENTIN 300 MG PO CAPS: 600.0000 mg | ORAL_CAPSULE | Freq: Two times a day (BID) | ORAL | Status: AC

## 2017-12-26 MED ORDER — ADULT MULTIVITAMIN W/MINERALS CH: 1.0000 | ORAL_TABLET | Freq: Every day | ORAL | 0 refills | Status: AC

## 2017-12-26 MED ORDER — ESCITALOPRAM OXALATE 20 MG PO TABS: 20.0000 mg | ORAL_TABLET | Freq: Every day | ORAL | Status: DC

## 2017-12-26 MED ORDER — LIVING WELL WITH DIABETES BOOK
Freq: Once | Status: AC
  Administered 2017-12-26: 14:00:00
  Filled 2017-12-26: qty 1

## 2017-12-26 MED ORDER — AMOXICILLIN-POT CLAVULANATE 875-125 MG PO TABS: 1.0000 | ORAL_TABLET | Freq: Two times a day (BID) | ORAL | 0 refills | Status: AC

## 2017-12-26 MED ORDER — ESCITALOPRAM OXALATE 20 MG PO TABS
ORAL_TABLET | ORAL | Status: DC
Start: 2017-12-26 — End: 2018-04-20

## 2017-12-26 NOTE — Progress Notes (Signed)
Patient refusing bed/chair alarm, insisting on getting up and ambulating with family/friends.  Risks of fall and injury reviewed in detail.

## 2017-12-26 NOTE — Care Management Important Message (Signed)
Copy of signed IM left in patient's room.    

## 2017-12-26 NOTE — Progress Notes (Signed)
Discharge instructions reviewed with patient and husband.  Understanding was verbalized and all questions were answered.  Living Well With Diabetes booklet given and reviewed with patient.  Patient discharged home via wheelchair in stable condition escorted by volunteers.

## 2017-12-26 NOTE — Progress Notes (Signed)
  Speech Language Pathology Treatment: Dysphagia  Patient Details Name: Karen Chen MRN: [id number] DOB: 05/19/1961 Today's Date: 12/26/2017 Time: 0940-1001 SLP Time Calculation (min) (ACUTE ONLY): 21 min  Assessment / Plan / Recommendation Clinical Impression  Tx today to determine if Karen Chen could have diet upgrade prior discharge. Karen Chen tolerated several sips of thin liquid without s/s of aspiration. Noted delayed cough after multiple sips. Karen Chen is also a smoker so dry cough may be related to smoking as opposed to swallowing. Karen Chen also tolerated several bites of solid food with min to no oral residue after the swallow. Vocal quality remained clear, laryngeal elevation adequate. Rec diet upgrade to Regular with thin. Carb modified per MD request as Karen Chen is new diabetic. Diet ordered. RD consulted and will adjust diet for new DX of Diabetes. NO ST f/u needed at discharge.   HPI HPI: Karen Chen  is a 57 y.o. female with a known history of HTN, RA, ADHD, PTSD, anx/dep, RLS who p/w 2d Hx agitation, culminating in frank encephalopathy in ED, ultimately necessitating intubation. Karen Chen is intubated at the time of my Hx/examination. Hx is obtained from Karen Chen's husband and sister, who are at bedside. They state that the Karen Chen has a Hx of severe Restless Leg Syndrome, and has been on multiple medications for this condition in the past. Her family believes she has been on Requip for some time now (they claim > 63yr), but they state she was recently started on a new medication, Valium, recently. She was previously on a combination of Xanax and Requip, but her Pain Clinic stopped her Xanax and changed it to Valium. It is reported that the Requip and Valium were subsequently stopped by Karen Chen's PCP ~2d ago. Since that time, it is reported that Karen Chen has been experiencing progressively worsening agitation. Karen Chen was reportedly combative/violent in ED, hitting/kicking and generally being a danger to herself and others. Karen Chen was reportedly twitching and  having spasms in the ED. There were no reports of F/C/N/V/D/AP, CP, SOB, palpitations, diaphoresis, rigors, night sweats, HA, blurred vision, vertigo, LH, LOC, urinary symptoms. ROS otherwise unobtainable, as Karen Chen intubated. Respiratory Therapy reports thick secretions. Karen Chen is an active smoker (> 1ppd). Karen Chen was orally intubated d/t airway concern; extubated on 12/24/17. Currently, NPO.       SLP Plan          Recommendations  Diet recommendations: Regular;Other(comment)(New diabet, carb modified) Liquids provided via: Cup Supervision: Patient able to self feed Compensations: Minimize environmental distractions;Slow rate;Small sips/bites Postural Changes and/or Swallow Maneuvers: Seated upright 90 degrees                Follow up Recommendations: None SLP Visit Diagnosis: Dysphagia, oropharyngeal phase (R13.12)       GO                Eather Colas 12/26/2017, 10:01 AM

## 2017-12-26 NOTE — Progress Notes (Signed)
Inpatient Diabetes Program Recommendations  AACE/ADA: New Consensus Statement on Inpatient Glycemic Control (2015)  Target Ranges:  Prepandial:   less than 140 mg/dL      Peak postprandial:   less than 180 mg/dL (1-2 hours)      Critically ill patients:  140 - 180 mg/dL   Lab Results  Component Value Date   GLUCAP 117 (H) 12/26/2017   HGBA1C 8.0 (H) 12/21/2017    Review of Glycemic ControlResults for KEENAN, DIMITROV (MRN [id number]) as of 12/26/2017 14:33  Ref. Range 12/25/2017 16:21 12/25/2017 16:39 12/25/2017 21:08 12/26/2017 07:34 12/26/2017 11:44  Glucose-Capillary Latest Ref Range: 65 - 99 mg/dL 85 88 053 (H) 976 (H) 734 (H)    Diabetes history: Type 2 DM -new onset Outpatient Diabetes medications: None  Inpatient Diabetes Program Recommendations:    Note new diagnosis of DM.  Discussed at length with patient.  She admits that she drinks mostly regular pepsi.  We discussed cutting out sugar from her beverages and reducing CHO/starchy veggies.  Patient states that her sister and daughter both have diabetes.  She states that she has extreme allergies/reactions to medications therefore she would like to try lifestyle modifications first.  She does have PCP.  Encouraged close follow up with them as well.  We discussed other lifestyle modifications such as exercise and healthy lifestyle.  She states she really hopes that she can quit smoking as she has not had a cigarette for 7 days.  We briefly discussed A1C results and goal.  Ordered Living well with DM booklet for patient as well.  She would benefit from outpatient DM education as well.  Patient appreciative of information and seems motivated to take care of herself.   Thanks,  Beryl Meager, RN, BC-ADM Inpatient Diabetes Coordinator Pager [phone]  (8a-5p)

## 2017-12-26 NOTE — Discharge Summary (Signed)
[facility] [place] [facility]   PATIENT NAME: Karen Chen    MR#:  [id number]  DATE OF BIRTH:  [birth year]  DATE OF ADMISSION:  12/21/2017 ADMITTING PHYSICIAN: Houston Siren, MD  DATE OF DISCHARGE: 12/26/2017  PRIMARY CARE PHYSICIAN: Carlean Jews, NP    ADMISSION DIAGNOSIS:  Acute encephalopathy  DISCHARGE DIAGNOSIS:  Active Problems:   Encephalopathy   SECONDARY DIAGNOSIS:   Past Medical History:  Diagnosis Date  . Anxiety   . Chronic lower back pain   . Hypertension   . Restless leg   . Rheumatoid arthritis (HCC)     HOSPITAL COURSE:   1.  Acute metabolic encephalopathy with agitation.  The patient likely had a delirium from Remeron.  Patient's mental status had improved back to baseline.  We restarted her low-dose prednisone. 2.  Fever likely aspiration pneumonia from being on the ventilator.  Finish up 1 week of Augmentin.  Prescription written. 3.  Acute hypoxic respiratory failure.  Patient is now breathing comfortably on room air.  The patient was intubated on the ventilator for a few days. 4.  Hypokalemia, hypomagnesemia and hypophosphatemia.  All these were replaced during the hospital course. 5.  History of essential hypertension.  Hydrochlorothiazide on hold with electrolyte abnormalities 6.  Chronic low back pain and restless leg syndrome and rheumatoid arthritis and anxiety.  Patient started back on her usual medications.  Her Remeron which was a new medication is held.  Patient placed back on Requip.  Needs follow-up for her restless leg syndrome as outpatient. 7.  Type 2 diabetes mellitus with a hemoglobin A1c of 8.0.  Patient was given sliding scale while here.  Patient hesitant on medication at this time.  Diet discussed at length.  Low carbohydrate diet. 8.  Patient did better today with swallowing and is cleared to go back on thin liquids. 9.  Weakness.  Physical therapy recommended home with home health   DISCHARGE  CONDITIONS:   Satisfactory  CONSULTS OBTAINED:  Treatment Team:  Barbaraann Rondo, MD  DRUG ALLERGIES:   Allergies  Allergen Reactions  . Orencia [Abatacept] Anaphylaxis  . Sulfa Antibiotics Anaphylaxis  . Azathioprine Other (See Comments)  . Indocin [Indomethacin]   . Levaquin [Levofloxacin In D5w]   . Methotrexate Derivatives   . Prozac [Fluoxetine Hcl]   . Remicade [Infliximab] Hives  . Robaxin [Methocarbamol]   . Tizanidine   . Zanaflex [Tizanidine Hcl] Other (See Comments)    Patient states a burning sensation in her mouth 30 minutes after taking.  . Adalimumab Itching and Rash    DISCHARGE MEDICATIONS:   Allergies as of 12/26/2017      Reactions   Orencia [abatacept] Anaphylaxis   Sulfa Antibiotics Anaphylaxis   Azathioprine Other (See Comments)   Indocin [indomethacin]    Levaquin [levofloxacin In D5w]    Methotrexate Derivatives    Prozac [fluoxetine Hcl]    Remicade [infliximab] Hives   Robaxin [methocarbamol]    Tizanidine    Zanaflex [tizanidine Hcl] Other (See Comments)   Patient states a burning sensation in her mouth 30 minutes after taking.   Adalimumab Itching, Rash      Medication List    STOP taking these medications   carbidopa-levodopa 10-100 MG tablet Commonly known as:  SINEMET   clotrimazole-betamethasone cream Commonly known as:  LOTRISONE   lidocaine 5 % Commonly known as:  LIDODERM   mirtazapine 15 MG tablet Commonly known as:  REMERON     TAKE  these medications   amoxicillin-clavulanate 875-125 MG tablet Commonly known as:  AUGMENTIN Take 1 tablet by mouth every 12 (twelve) hours.   aspirin 81 MG tablet Take 81 mg by mouth daily.   Calcium Plus D3 Absorbable [phone] MG-UNIT Caps Take 1 capsule by mouth daily with breakfast.   escitalopram 20 MG tablet Commonly known as:  LEXAPRO 2 tabs po daily What changed:    how much to take  how to take this  when to take this  additional instructions   gabapentin  300 MG capsule Commonly known as:  NEURONTIN Take 2 capsules (600 mg total) by mouth 2 (two) times daily.   multivitamin with minerals Tabs tablet Take 1 tablet by mouth daily.   oxyCODONE-acetaminophen 5-325 MG tablet Commonly known as:  PERCOCET/ROXICET Take 1 tablet by mouth every 8 (eight) hours as needed.   potassium chloride 10 MEQ tablet Commonly known as:  K-DUR Take 2 tablets (20 mEq total) by mouth daily. Take two tablets.   predniSONE 5 MG tablet Commonly known as:  DELTASONE Take 5 mg by mouth 2 (two) times daily with a meal.   PROAIR HFA 108 (90 Base) MCG/ACT inhaler Generic drug:  albuterol Inhale 90 mcg into the lungs 4 (four) times daily as needed. 1-2 puffs.   rOPINIRole 2 MG tablet Commonly known as:  REQUIP Take 1 tablet (2 mg total) by mouth 2 (two) times daily.            Durable Medical Equipment  (From admission, onward)        Start     Ordered   12/26/17 0945  DME Glucometer  Once    Comments:  And test strips, and lancets   12/26/17 0944   12/26/17 0945  For home use only DME Walker rolling  Once    Question:  Patient needs a walker to treat with the following condition  Answer:  Unsteady gait   12/26/17 0944       DISCHARGE INSTRUCTIONS:   Follow-up PMD 6 days  If you experience worsening of your admission symptoms, develop shortness of breath, life threatening emergency, suicidal or homicidal thoughts you must seek medical attention immediately by calling 911 or calling your MD immediately  if symptoms less severe.  You Must read complete instructions/literature along with all the possible adverse reactions/side effects for all the Medicines you take and that have been prescribed to you. Take any new Medicines after you have completely understood and accept all the possible adverse reactions/side effects.   Please note  You were cared for by a hospitalist during your hospital stay. If you have any questions about your discharge  medications or the care you received while you were in the hospital after you are discharged, you can call the unit and asked to speak with the hospitalist on call if the hospitalist that took care of you is not available. Once you are discharged, your primary care physician will handle any further medical issues. Please note that NO REFILLS for any discharge medications will be authorized once you are discharged, as it is imperative that you return to your primary care physician (or establish a relationship with a primary care physician if you do not have one) for your aftercare needs so that they can reassess your need for medications and monitor your lab values.    Today   CHIEF COMPLAINT:   Chief Complaint  Patient presents with  . Dysphagia    HISTORY OF PRESENT ILLNESS:  Karen Chen  is a 57 y.o. female  admitted with acute encephalopathy and agitation   VITAL SIGNS:  Blood pressure (!) 147/79, pulse 70, temperature 98.3 F (36.8 C), temperature source Oral, resp. rate 18, height 5\' 6"  (1.676 m), weight 67.3 kg (148 lb 6.4 oz), SpO2 99 %.   PHYSICAL EXAMINATION:  GENERAL:  57 y.o.-year-old patient lying in the bed with no acute distress.  EYES: Pupils equal, round, reactive to light and accommodation. No scleral icterus. Extraocular muscles intact.  HEENT: Head atraumatic, normocephalic. Oropharynx and nasopharynx clear.  NECK:  Supple, no jugular venous distention. No thyroid enlargement, no tenderness.  LUNGS: Normal breath sounds bilaterally, no wheezing, rales,rhonchi or crepitation. No use of accessory muscles of respiration.  CARDIOVASCULAR: S1, S2 normal. No murmurs, rubs, or gallops.  ABDOMEN: Soft, non-tender, non-distended. Bowel sounds present. No organomegaly or mass.  EXTREMITIES: No pedal edema, cyanosis, or clubbing.  NEUROLOGIC: Cranial nerves II through XII are intact. Muscle strength 5/5 in all extremities. Sensation intact. Gait not checked.  PSYCHIATRIC: The  patient is alert and oriented x 3.  SKIN: No obvious rash, lesion, or ulcer.   DATA REVIEW:   CBC Recent Labs  Lab 12/26/17 0601  WBC 10.0  HGB 12.4  HCT 36.7  PLT 280    Chemistries  Recent Labs  Lab 12/24/17 0557 12/25/17 0504  NA 144 145  K 3.5 4.3  CL 111 109  CO2 27 26  GLUCOSE 152* 86  BUN 27* 17  CREATININE 0.62 0.51  CALCIUM 8.2* 8.5*  MG 2.0  --   AST 27  --   ALT 51  --   ALKPHOS 82  --   BILITOT 0.5  --      Microbiology Results  Results for orders placed or performed during the hospital encounter of 12/21/17  Blood Culture (routine x 2)     Status: None   Collection Time: 12/21/17  4:14 AM  Result Value Ref Range Status   Specimen Description BLOOD BLOOD LEFT FOREARM  Final   Special Requests   Final    BOTTLES DRAWN AEROBIC AND ANAEROBIC Blood Culture results may not be optimal due to an excessive volume of blood received in culture bottles   Culture   Final    NO GROWTH 5 DAYS Performed at [facility], [address] [place], [place] [address]    Report Status 12/26/2017 FINAL  Final  Blood Culture (routine x 2)     Status: None   Collection Time: 12/21/17  4:14 AM  Result Value Ref Range Status   Specimen Description BLOOD BLOOD LEFT WRIST  Final   Special Requests   Final    BOTTLES DRAWN AEROBIC AND ANAEROBIC Blood Culture results may not be optimal due to an inadequate volume of blood received in culture bottles   Culture   Final    NO GROWTH 5 DAYS Performed at [facility], [address], [place], [place] [address]    Report Status 12/26/2017 FINAL  Final  Culture, respiratory (NON-Expectorated)     Status: None   Collection Time: 12/21/17  5:50 AM  Result Value Ref Range Status   Specimen Description   Final    TRACHEAL ASPIRATE Performed at [facility], [address] [place], [place] [address]    Special Requests   Final    Normal Performed at [facility], [address]  [place], [place] [address]    Gram Stain  Final    MODERATE WBC PRESENT,BOTH PMN AND MONONUCLEAR RARE SQUAMOUS EPITHELIAL CELLS PRESENT MODERATE GRAM POSITIVE COCCI IN PAIRS IN CHAINS RARE GRAM POSITIVE RODS    Culture   Final    Consistent with normal respiratory flora. Performed at [facility], [address], [place], [place] [address]    Report Status 12/23/2017 FINAL  Final  MRSA PCR Screening     Status: None   Collection Time: 12/21/17  8:42 AM  Result Value Ref Range Status   MRSA by PCR NEGATIVE NEGATIVE Final    Comment:        The GeneXpert MRSA Assay (FDA approved for NASAL specimens only), is one component of a comprehensive MRSA colonization surveillance program. It is not intended to diagnose MRSA infection nor to guide or monitor treatment for MRSA infections. Performed at [facility], [address] [place], [place] [address]   Culture, respiratory (NON-Expectorated)     Status: None (Preliminary result)   Collection Time: 12/24/17  5:09 AM  Result Value Ref Range Status   Specimen Description   Final    TRACHEAL ASPIRATE Performed at [facility], [address] [place], [place] [address]    Special Requests   Final    NONE Performed at [facility], [address] [place], [place] [address]    Gram Stain   Final    NO SQUAMOUS EPITHELIAL CELLS PRESENT RARE WBC PRESENT, PREDOMINANTLY MONONUCLEAR FEW YEAST    Culture   Final    CULTURE REINCUBATED FOR BETTER GROWTH Performed at [facility], [address], [place], [place] [address]    Report Status PENDING  Incomplete  Urine Culture     Status: None   Collection Time: 12/24/17  5:13 AM  Result Value Ref Range Status   Specimen Description   Final    URINE, RANDOM Performed at [facility], [address], [place], [place] [address]    Special Requests   Final    NONE Performed at [facility], [address] [place],  [place] [address]    Culture   Final    NO GROWTH Performed at [facility], [address] [place], [place] [address]    Report Status 12/25/2017 FINAL  Final  Culture, blood (Routine X 2) w Reflex to ID Panel     Status: None (Preliminary result)   Collection Time: 12/24/17  5:57 AM  Result Value Ref Range Status   Specimen Description BLOOD LEFT HAND  Final   Special Requests   Final    BOTTLES DRAWN AEROBIC AND ANAEROBIC Blood Culture adequate volume   Culture   Final    NO GROWTH 2 DAYS Performed at [facility], [address], [place], [place] [address]    Report Status PENDING  Incomplete  Culture, blood (Routine X 2) w Reflex to ID Panel     Status: None (Preliminary result)   Collection Time: 12/24/17  7:22 AM  Result Value Ref Range Status   Specimen Description BLOOD LT HAND  Final   Special Requests   Final    BOTTLES DRAWN AEROBIC AND ANAEROBIC Blood Culture adequate volume   Culture   Final    NO GROWTH 2 DAYS Performed at [facility], [address], [place], [place] [address]    Report Status PENDING  Incomplete      Management plans discussed with the patient, family and they are in  agreement.  CODE STATUS:     Code Status Orders  (From admission, onward)        Start     Ordered   12/21/17 0841  Full code  Continuous     12/21/17 0840    Code Status History    Date Active Date Inactive Code Status Order ID Comments User Context   09/16/2016 0346 09/16/2016 2114 Full Code [id number]  Hugelmeyer, Jon Gills, DO ED      TOTAL TIME TAKING CARE OF THIS PATIENT: 35 minutes.    Alford Highland M.D on 12/26/2017 at 1:34 PM  Between 7am to 6pm - Pager - [phone]  After 6pm go to [email or web address] - password [email or web address]  [facility] Office  [phone]  CC: Primary care physician; Carlean Jews, NP

## 2017-12-27 ENCOUNTER — Telehealth: Payer: Self-pay | Admitting: Nurse Practitioner

## 2017-12-27 ENCOUNTER — Telehealth: Payer: Self-pay | Admitting: Internal Medicine

## 2017-12-27 NOTE — Telephone Encounter (Signed)
Called patient to let her know to stop all medication at this time and will follow up ,

## 2017-12-27 NOTE — Telephone Encounter (Signed)
Patient called and states that she was given a new prescription that she took and then ended up in the emergency room , Patient states that she had a reaction to the mirtazapine and then was given ativan and another medication in the ER that she had another reaction too and was intubated ,patient was in the hospital from 4/25- 4/30 patient is now concerned with taken any medication . Patient was advised to continue with medications that she has been taking in the past but dc the mirtazapine and follow up with dr Welton Flakes on May 7. Pt understood, and I will follow up with dr Welton Flakes and let her know if anything else is needed.

## 2017-12-28 ENCOUNTER — Other Ambulatory Visit: Payer: Self-pay

## 2017-12-28 ENCOUNTER — Telehealth: Payer: Self-pay

## 2017-12-28 ENCOUNTER — Ambulatory Visit: Payer: Self-pay | Admitting: Nurse Practitioner

## 2017-12-28 ENCOUNTER — Telehealth: Payer: Self-pay | Admitting: Nurse Practitioner

## 2017-12-28 DIAGNOSIS — M059 Rheumatoid arthritis with rheumatoid factor, unspecified: Secondary | ICD-10-CM | POA: Diagnosis not present

## 2017-12-28 DIAGNOSIS — R601 Generalized edema: Secondary | ICD-10-CM | POA: Diagnosis not present

## 2017-12-28 LAB — CULTURE, RESPIRATORY

## 2017-12-28 LAB — CULTURE, RESPIRATORY W GRAM STAIN

## 2017-12-28 MED ORDER — ROPINIROLE HCL 1 MG PO TABS: ORAL_TABLET | ORAL | 0 refills | Status: DC

## 2017-12-28 NOTE — Telephone Encounter (Signed)
Pt walk in today that she need med for restless leg syndromde as per dr Welton Flakes we make her follow up to see dr Welton Flakes on Monday and esend requip 1 mg 3 times daily

## 2017-12-28 NOTE — Telephone Encounter (Signed)
Tried calling patient, I had to leave a detailed message, I advised patient that Dr Welton Flakes wanted to hold her medications until her appt so we could change her meds and best for her health to stop certain meds until out of her system and not adding more meds until she can stay off most and see her in office to review and discuss best for her health and apologize if anything was confusing or miscommunicated and to keep her appt in office to discuss and review. Beth

## 2017-12-28 NOTE — Telephone Encounter (Signed)
Spoke with pt today that we going to start requip and followup with dr Welton Flakes on monday

## 2017-12-29 LAB — CULTURE, BLOOD (ROUTINE X 2)
Culture: NO GROWTH
Culture: NO GROWTH
SPECIAL REQUESTS: ADEQUATE
Special Requests: ADEQUATE

## 2018-01-01 ENCOUNTER — Ambulatory Visit: Payer: Self-pay | Admitting: Internal Medicine

## 2018-01-01 DIAGNOSIS — Z9181 History of falling: Secondary | ICD-10-CM | POA: Diagnosis not present

## 2018-01-01 DIAGNOSIS — M549 Dorsalgia, unspecified: Secondary | ICD-10-CM | POA: Diagnosis not present

## 2018-01-01 DIAGNOSIS — M069 Rheumatoid arthritis, unspecified: Secondary | ICD-10-CM | POA: Diagnosis not present

## 2018-01-01 DIAGNOSIS — E119 Type 2 diabetes mellitus without complications: Secondary | ICD-10-CM | POA: Diagnosis not present

## 2018-01-01 DIAGNOSIS — G8929 Other chronic pain: Secondary | ICD-10-CM | POA: Diagnosis not present

## 2018-01-01 DIAGNOSIS — I10 Essential (primary) hypertension: Secondary | ICD-10-CM | POA: Diagnosis not present

## 2018-01-01 DIAGNOSIS — G2581 Restless legs syndrome: Secondary | ICD-10-CM | POA: Diagnosis not present

## 2018-01-01 DIAGNOSIS — Z87891 Personal history of nicotine dependence: Secondary | ICD-10-CM | POA: Diagnosis not present

## 2018-01-01 DIAGNOSIS — Z7982 Long term (current) use of aspirin: Secondary | ICD-10-CM | POA: Diagnosis not present

## 2018-01-01 DIAGNOSIS — Z7952 Long term (current) use of systemic steroids: Secondary | ICD-10-CM | POA: Diagnosis not present

## 2018-01-02 ENCOUNTER — Ambulatory Visit: Payer: Self-pay | Admitting: Internal Medicine

## 2018-01-02 DIAGNOSIS — Z9181 History of falling: Secondary | ICD-10-CM | POA: Diagnosis not present

## 2018-01-02 DIAGNOSIS — G8929 Other chronic pain: Secondary | ICD-10-CM | POA: Diagnosis not present

## 2018-01-02 DIAGNOSIS — M069 Rheumatoid arthritis, unspecified: Secondary | ICD-10-CM | POA: Diagnosis not present

## 2018-01-02 DIAGNOSIS — I10 Essential (primary) hypertension: Secondary | ICD-10-CM | POA: Diagnosis not present

## 2018-01-02 DIAGNOSIS — E119 Type 2 diabetes mellitus without complications: Secondary | ICD-10-CM | POA: Diagnosis not present

## 2018-01-02 DIAGNOSIS — M549 Dorsalgia, unspecified: Secondary | ICD-10-CM | POA: Diagnosis not present

## 2018-01-02 DIAGNOSIS — Z87891 Personal history of nicotine dependence: Secondary | ICD-10-CM | POA: Diagnosis not present

## 2018-01-02 DIAGNOSIS — Z7982 Long term (current) use of aspirin: Secondary | ICD-10-CM | POA: Diagnosis not present

## 2018-01-02 DIAGNOSIS — Z7952 Long term (current) use of systemic steroids: Secondary | ICD-10-CM | POA: Diagnosis not present

## 2018-01-02 DIAGNOSIS — G2581 Restless legs syndrome: Secondary | ICD-10-CM | POA: Diagnosis not present

## 2018-01-04 ENCOUNTER — Other Ambulatory Visit: Payer: Self-pay

## 2018-01-04 DIAGNOSIS — M069 Rheumatoid arthritis, unspecified: Secondary | ICD-10-CM | POA: Diagnosis not present

## 2018-01-04 DIAGNOSIS — Z7982 Long term (current) use of aspirin: Secondary | ICD-10-CM | POA: Diagnosis not present

## 2018-01-04 DIAGNOSIS — Z7952 Long term (current) use of systemic steroids: Secondary | ICD-10-CM | POA: Diagnosis not present

## 2018-01-04 DIAGNOSIS — G8929 Other chronic pain: Secondary | ICD-10-CM | POA: Diagnosis not present

## 2018-01-04 DIAGNOSIS — I10 Essential (primary) hypertension: Secondary | ICD-10-CM | POA: Diagnosis not present

## 2018-01-04 DIAGNOSIS — Z9181 History of falling: Secondary | ICD-10-CM | POA: Diagnosis not present

## 2018-01-04 DIAGNOSIS — Z87891 Personal history of nicotine dependence: Secondary | ICD-10-CM | POA: Diagnosis not present

## 2018-01-04 DIAGNOSIS — G2581 Restless legs syndrome: Secondary | ICD-10-CM | POA: Diagnosis not present

## 2018-01-04 DIAGNOSIS — E119 Type 2 diabetes mellitus without complications: Secondary | ICD-10-CM | POA: Diagnosis not present

## 2018-01-04 DIAGNOSIS — M549 Dorsalgia, unspecified: Secondary | ICD-10-CM | POA: Diagnosis not present

## 2018-01-04 MED ORDER — HYDROCHLOROTHIAZIDE 25 MG PO TABS: 25.0000 mg | ORAL_TABLET | Freq: Every day | ORAL | 1 refills | Status: DC

## 2018-01-08 ENCOUNTER — Telehealth: Payer: Self-pay

## 2018-01-08 NOTE — Telephone Encounter (Signed)
Sent to provider 

## 2018-01-09 ENCOUNTER — Telehealth: Payer: Self-pay

## 2018-01-09 DIAGNOSIS — G8929 Other chronic pain: Secondary | ICD-10-CM | POA: Diagnosis not present

## 2018-01-09 DIAGNOSIS — Z9181 History of falling: Secondary | ICD-10-CM | POA: Diagnosis not present

## 2018-01-09 DIAGNOSIS — M549 Dorsalgia, unspecified: Secondary | ICD-10-CM | POA: Diagnosis not present

## 2018-01-09 DIAGNOSIS — I10 Essential (primary) hypertension: Secondary | ICD-10-CM | POA: Diagnosis not present

## 2018-01-09 DIAGNOSIS — M069 Rheumatoid arthritis, unspecified: Secondary | ICD-10-CM | POA: Diagnosis not present

## 2018-01-09 DIAGNOSIS — G2581 Restless legs syndrome: Secondary | ICD-10-CM | POA: Diagnosis not present

## 2018-01-09 DIAGNOSIS — E119 Type 2 diabetes mellitus without complications: Secondary | ICD-10-CM | POA: Diagnosis not present

## 2018-01-09 DIAGNOSIS — Z7952 Long term (current) use of systemic steroids: Secondary | ICD-10-CM | POA: Diagnosis not present

## 2018-01-09 DIAGNOSIS — Z7982 Long term (current) use of aspirin: Secondary | ICD-10-CM | POA: Diagnosis not present

## 2018-01-09 DIAGNOSIS — Z87891 Personal history of nicotine dependence: Secondary | ICD-10-CM | POA: Diagnosis not present

## 2018-01-09 NOTE — Telephone Encounter (Signed)
I tried calling pt to ask about future appts, pt missed last appt and we have PT orders and need to know if pt plans on returning or if pt has seen another primary care provider. Beth

## 2018-01-11 ENCOUNTER — Telehealth: Payer: Self-pay

## 2018-01-11 DIAGNOSIS — M961 Postlaminectomy syndrome, not elsewhere classified: Secondary | ICD-10-CM | POA: Diagnosis not present

## 2018-01-11 DIAGNOSIS — Z79899 Other long term (current) drug therapy: Secondary | ICD-10-CM | POA: Diagnosis not present

## 2018-01-11 DIAGNOSIS — Z79891 Long term (current) use of opiate analgesic: Secondary | ICD-10-CM | POA: Diagnosis not present

## 2018-01-11 DIAGNOSIS — G894 Chronic pain syndrome: Secondary | ICD-10-CM | POA: Diagnosis not present

## 2018-01-11 DIAGNOSIS — M069 Rheumatoid arthritis, unspecified: Secondary | ICD-10-CM | POA: Diagnosis not present

## 2018-01-11 NOTE — Telephone Encounter (Signed)
Harriett Sine from [facility] called requesting verbal order for OT, PT and Home heath medical social worker eval for information on community resources, mental health management, and transition into mental health services.  I gave the ok for order to be done.  dbs

## 2018-01-12 DIAGNOSIS — Z7952 Long term (current) use of systemic steroids: Secondary | ICD-10-CM | POA: Diagnosis not present

## 2018-01-12 DIAGNOSIS — M549 Dorsalgia, unspecified: Secondary | ICD-10-CM | POA: Diagnosis not present

## 2018-01-12 DIAGNOSIS — M069 Rheumatoid arthritis, unspecified: Secondary | ICD-10-CM | POA: Diagnosis not present

## 2018-01-12 DIAGNOSIS — G8929 Other chronic pain: Secondary | ICD-10-CM | POA: Diagnosis not present

## 2018-01-12 DIAGNOSIS — E119 Type 2 diabetes mellitus without complications: Secondary | ICD-10-CM | POA: Diagnosis not present

## 2018-01-12 DIAGNOSIS — Z87891 Personal history of nicotine dependence: Secondary | ICD-10-CM | POA: Diagnosis not present

## 2018-01-12 DIAGNOSIS — G2581 Restless legs syndrome: Secondary | ICD-10-CM | POA: Diagnosis not present

## 2018-01-12 DIAGNOSIS — Z7982 Long term (current) use of aspirin: Secondary | ICD-10-CM | POA: Diagnosis not present

## 2018-01-12 DIAGNOSIS — Z9181 History of falling: Secondary | ICD-10-CM | POA: Diagnosis not present

## 2018-01-12 DIAGNOSIS — I10 Essential (primary) hypertension: Secondary | ICD-10-CM | POA: Diagnosis not present

## 2018-01-15 DIAGNOSIS — Z9181 History of falling: Secondary | ICD-10-CM | POA: Diagnosis not present

## 2018-01-15 DIAGNOSIS — Z87891 Personal history of nicotine dependence: Secondary | ICD-10-CM | POA: Diagnosis not present

## 2018-01-15 DIAGNOSIS — Z7982 Long term (current) use of aspirin: Secondary | ICD-10-CM | POA: Diagnosis not present

## 2018-01-15 DIAGNOSIS — E119 Type 2 diabetes mellitus without complications: Secondary | ICD-10-CM | POA: Diagnosis not present

## 2018-01-15 DIAGNOSIS — G8929 Other chronic pain: Secondary | ICD-10-CM | POA: Diagnosis not present

## 2018-01-15 DIAGNOSIS — G2581 Restless legs syndrome: Secondary | ICD-10-CM | POA: Diagnosis not present

## 2018-01-15 DIAGNOSIS — M549 Dorsalgia, unspecified: Secondary | ICD-10-CM | POA: Diagnosis not present

## 2018-01-15 DIAGNOSIS — M069 Rheumatoid arthritis, unspecified: Secondary | ICD-10-CM | POA: Diagnosis not present

## 2018-01-15 DIAGNOSIS — Z7952 Long term (current) use of systemic steroids: Secondary | ICD-10-CM | POA: Diagnosis not present

## 2018-01-15 DIAGNOSIS — I10 Essential (primary) hypertension: Secondary | ICD-10-CM | POA: Diagnosis not present

## 2018-01-17 DIAGNOSIS — G8929 Other chronic pain: Secondary | ICD-10-CM | POA: Diagnosis not present

## 2018-01-17 DIAGNOSIS — M549 Dorsalgia, unspecified: Secondary | ICD-10-CM | POA: Diagnosis not present

## 2018-01-17 DIAGNOSIS — Z7952 Long term (current) use of systemic steroids: Secondary | ICD-10-CM | POA: Diagnosis not present

## 2018-01-17 DIAGNOSIS — G2581 Restless legs syndrome: Secondary | ICD-10-CM | POA: Diagnosis not present

## 2018-01-17 DIAGNOSIS — M069 Rheumatoid arthritis, unspecified: Secondary | ICD-10-CM | POA: Diagnosis not present

## 2018-01-17 DIAGNOSIS — Z9181 History of falling: Secondary | ICD-10-CM | POA: Diagnosis not present

## 2018-01-17 DIAGNOSIS — Z7982 Long term (current) use of aspirin: Secondary | ICD-10-CM | POA: Diagnosis not present

## 2018-01-17 DIAGNOSIS — I10 Essential (primary) hypertension: Secondary | ICD-10-CM | POA: Diagnosis not present

## 2018-01-17 DIAGNOSIS — E119 Type 2 diabetes mellitus without complications: Secondary | ICD-10-CM | POA: Diagnosis not present

## 2018-01-17 DIAGNOSIS — Z87891 Personal history of nicotine dependence: Secondary | ICD-10-CM | POA: Diagnosis not present

## 2018-01-23 DIAGNOSIS — Z9181 History of falling: Secondary | ICD-10-CM | POA: Diagnosis not present

## 2018-01-23 DIAGNOSIS — I10 Essential (primary) hypertension: Secondary | ICD-10-CM | POA: Diagnosis not present

## 2018-01-23 DIAGNOSIS — Z7982 Long term (current) use of aspirin: Secondary | ICD-10-CM | POA: Diagnosis not present

## 2018-01-23 DIAGNOSIS — Z87891 Personal history of nicotine dependence: Secondary | ICD-10-CM | POA: Diagnosis not present

## 2018-01-23 DIAGNOSIS — M549 Dorsalgia, unspecified: Secondary | ICD-10-CM | POA: Diagnosis not present

## 2018-01-23 DIAGNOSIS — G2581 Restless legs syndrome: Secondary | ICD-10-CM | POA: Diagnosis not present

## 2018-01-23 DIAGNOSIS — G8929 Other chronic pain: Secondary | ICD-10-CM | POA: Diagnosis not present

## 2018-01-23 DIAGNOSIS — Z7952 Long term (current) use of systemic steroids: Secondary | ICD-10-CM | POA: Diagnosis not present

## 2018-01-23 DIAGNOSIS — E119 Type 2 diabetes mellitus without complications: Secondary | ICD-10-CM | POA: Diagnosis not present

## 2018-01-23 DIAGNOSIS — M069 Rheumatoid arthritis, unspecified: Secondary | ICD-10-CM | POA: Diagnosis not present

## 2018-01-26 ENCOUNTER — Other Ambulatory Visit: Payer: Self-pay

## 2018-01-26 MED ORDER — HYDROCHLOROTHIAZIDE 25 MG PO TABS: 25.0000 mg | ORAL_TABLET | Freq: Every day | ORAL | 0 refills | Status: DC

## 2018-02-02 DIAGNOSIS — R5383 Other fatigue: Secondary | ICD-10-CM | POA: Diagnosis not present

## 2018-02-02 DIAGNOSIS — R399 Unspecified symptoms and signs involving the genitourinary system: Secondary | ICD-10-CM | POA: Diagnosis not present

## 2018-02-02 DIAGNOSIS — R1011 Right upper quadrant pain: Secondary | ICD-10-CM | POA: Diagnosis not present

## 2018-02-05 ENCOUNTER — Other Ambulatory Visit: Payer: Self-pay | Admitting: Pediatrics

## 2018-02-05 DIAGNOSIS — R1011 Right upper quadrant pain: Secondary | ICD-10-CM

## 2018-02-05 DIAGNOSIS — R399 Unspecified symptoms and signs involving the genitourinary system: Secondary | ICD-10-CM

## 2018-02-06 ENCOUNTER — Other Ambulatory Visit: Payer: Self-pay | Admitting: Pediatrics

## 2018-02-06 DIAGNOSIS — R399 Unspecified symptoms and signs involving the genitourinary system: Secondary | ICD-10-CM

## 2018-02-07 ENCOUNTER — Ambulatory Visit: Admission: RE | Admit: 2018-02-07 | Payer: Medicare Other | Source: Ambulatory Visit

## 2018-03-01 ENCOUNTER — Other Ambulatory Visit: Payer: Self-pay

## 2018-03-01 ENCOUNTER — Emergency Department
Admission: EM | Admit: 2018-03-01 | Discharge: 2018-03-01 | Disposition: A | Discharge status: Home / Self Care | Payer: Medicare Other | Attending: Emergency Medicine | Admitting: Emergency Medicine

## 2018-03-01 DIAGNOSIS — I10 Essential (primary) hypertension: Secondary | ICD-10-CM | POA: Diagnosis not present

## 2018-03-01 DIAGNOSIS — Z7982 Long term (current) use of aspirin: Secondary | ICD-10-CM | POA: Diagnosis not present

## 2018-03-01 DIAGNOSIS — F17200 Nicotine dependence, unspecified, uncomplicated: Secondary | ICD-10-CM | POA: Diagnosis not present

## 2018-03-01 DIAGNOSIS — J45909 Unspecified asthma, uncomplicated: Secondary | ICD-10-CM | POA: Insufficient documentation

## 2018-03-01 DIAGNOSIS — Z79899 Other long term (current) drug therapy: Secondary | ICD-10-CM | POA: Diagnosis not present

## 2018-03-01 DIAGNOSIS — R197 Diarrhea, unspecified: Secondary | ICD-10-CM | POA: Diagnosis not present

## 2018-03-01 DIAGNOSIS — K529 Noninfective gastroenteritis and colitis, unspecified: Secondary | ICD-10-CM | POA: Diagnosis not present

## 2018-03-01 DIAGNOSIS — R1111 Vomiting without nausea: Secondary | ICD-10-CM | POA: Diagnosis not present

## 2018-03-01 DIAGNOSIS — R112 Nausea with vomiting, unspecified: Secondary | ICD-10-CM | POA: Diagnosis not present

## 2018-03-01 LAB — GASTROINTESTINAL PANEL BY PCR, STOOL (REPLACES STOOL CULTURE)

## 2018-03-01 MED ORDER — SODIUM CHLORIDE 0.9 % IV BOLUS
1000.0000 mL | Freq: Once | INTRAVENOUS | Status: AC
  Administered 2018-03-01: 1000 mL via INTRAVENOUS

## 2018-03-01 MED ORDER — LOPERAMIDE HCL 2 MG PO CAPS
4.0000 mg | ORAL_CAPSULE | Freq: Once | ORAL | Status: AC
  Administered 2018-03-01: 4 mg via ORAL
  Filled 2018-03-01: qty 2

## 2018-03-01 MED ORDER — ONDANSETRON HCL 4 MG/2ML IJ SOLN
INTRAMUSCULAR | Status: AC
  Filled 2018-03-01: qty 2

## 2018-03-01 MED ORDER — ONDANSETRON 4 MG PO TBDP: 4.0000 mg | ORAL_TABLET | Freq: Three times a day (TID) | ORAL | 0 refills | Status: AC | PRN

## 2018-03-01 MED ORDER — LOPERAMIDE HCL 2 MG PO CAPS
ORAL_CAPSULE | ORAL | Status: AC
  Filled 2018-03-01: qty 1

## 2018-03-01 MED ORDER — ONDANSETRON HCL 4 MG/2ML IJ SOLN
4.0000 mg | Freq: Once | INTRAMUSCULAR | Status: AC
  Administered 2018-03-01: 4 mg via INTRAVENOUS

## 2018-03-01 NOTE — ED Notes (Signed)
Yellow isolation caddy specifying "Enteric Precautions" placed outside of ED Rm 5 at this time.

## 2018-03-01 NOTE — ED Triage Notes (Signed)
N/v/d, after eating at a new restaurant tonight.

## 2018-03-01 NOTE — ED Provider Notes (Signed)
[facility] Emergency Department Provider Note   First MD Initiated Contact with Patient 03/01/18 [phone]     (approximate)  I have reviewed the triage vital signs and the nursing notes.   HISTORY  Chief Complaint Emesis    HPI Karen Chen is a 57 y.o. female with below list of chronic medical conditions presents to the emergency department with acute onset of nausea vomiting and diarrhea after eating at a new restaurant.  Patient denies any abdominal pain no fever.   Past Medical History:  Diagnosis Date  . Anxiety   . Chronic lower back pain   . Hypertension   . Restless leg   . Rheumatoid arthritis [facility])     Patient Active Problem List   Diagnosis Date Noted  . Encephalopathy 12/21/2017  . Myalgia 08/23/2017  . Tremor, unspecified 08/23/2017  . Insomnia due to other mental disorder 08/23/2017  . Hypotension, unspecified 08/23/2017  . Major depressive disorder, recurrent, severe with psychotic symptoms (HCC) 08/23/2017  . Wheezing 08/23/2017  . Tinea pedis 08/23/2017  . Mild intermittent asthma, uncomplicated 08/23/2017  . Primary generalized (osteo)arthritis 08/23/2017  . Nausea 08/23/2017  . Peripheral vascular disease (HCC) 08/23/2017  . Generalized anxiety disorder 08/23/2017  . Nicotine dependence, cigarettes, uncomplicated 08/23/2017  . Hypokalemia 04/24/2017  . Vitamin D insufficiency 04/24/2017  . Failed back surgical syndrome 03/09/2017  . Grade II Anterolisthesis of L4 over L5 03/09/2017  . Epidural fibrosis 03/09/2017  . Chronic L4 bilateral pars defect 03/09/2017  . L2-3 and L3-4 intervertebral disc extrusion 03/09/2017  . Abnormal MRI, lumbar spine (09/28/2016) 03/09/2017  . Lumbar facet hypertrophy 03/09/2017  . Lumbar facet syndrome (Bilateral) (L>R) 03/09/2017  . Lumbar L3-4 lateral recess stenosis (Bilateral) (L>R) 03/09/2017  . Chronic pain syndrome 03/07/2017  . Long-term use of high-risk medication 03/07/2017    . Long term prescription benzodiazepine use 03/07/2017  . Chronic low back pain (Primary Source of Pain) (Bilateral) (L>R) 03/07/2017  . Chronic lower extremity pain (Secondary source of pain) (Bilateral) (L>R) 03/07/2017  . Chronic hand pain [facility] source of pain) (Bilateral) (R>L) 03/07/2017  . Chronic neck pain (Posterior) (Bilateral) (R>L) 03/07/2017  . Chronic shoulder pain (Bilateral) (R>L) 03/07/2017  . Chronic foot pain (Bilateral) (R>L) 03/07/2017  . TIA (transient ischemic attack) 09/16/2016  . CVA (cerebral vascular accident) (HCC) 09/15/2016  . Rheumatoid arthritis involving multiple sites with positive rheumatoid factor (HCC) 05/04/2016  . ADHD (attention deficit hyperactivity disorder) 03/25/2015  . Bite from insect 03/25/2015  . PTSD (post-traumatic stress disorder) 03/25/2015  . Anxiety 03/26/2014  . Hypertension 03/26/2014  . Restless leg 09/20/2013    Past Surgical History:  Procedure Laterality Date  . BACK SURGERY  1980    Prior to Admission medications   Medication Sig Start Date End Date Taking? Authorizing Provider  amoxicillin-clavulanate (AUGMENTIN) 875-125 MG tablet Take 1 tablet by mouth every 12 (twelve) hours. 12/26/17  Yes Wieting, Richard, MD  Multiple Vitamin (MULTIVITAMIN WITH MINERALS) TABS tablet Take 1 tablet by mouth daily. 12/26/17  Yes Wieting, Richard, MD  aspirin 81 MG tablet Take 81 mg by mouth daily.    [provider]  Calcium Carb-Cholecalciferol (CALCIUM PLUS D3 ABSORBABLE) [phone] MG-UNIT CAPS Take 1 capsule by mouth daily with breakfast. 04/24/17 07/23/17  Delano Metz, MD  escitalopram (LEXAPRO) 20 MG tablet 2 tabs po daily 12/26/17   Alford Highland, MD  gabapentin (NEURONTIN) 300 MG capsule Take 2 capsules (600 mg total) by mouth 2 (two) times daily.  12/26/17   Alford Highland, MD  hydrochlorothiazide (HYDRODIURIL) 25 MG tablet Take 1 tablet (25 mg total) by mouth daily. 01/26/18   Carlean Jews, NP   oxyCODONE-acetaminophen (PERCOCET/ROXICET) 5-325 MG tablet Take 1 tablet by mouth every 8 (eight) hours as needed.     [provider]  potassium chloride (K-DUR) 10 MEQ tablet Take 2 tablets (20 mEq total) by mouth daily. Take two tablets. 11/07/17   Carlean Jews, NP  predniSONE (DELTASONE) 5 MG tablet Take 5 mg by mouth 2 (two) times daily with a meal.    [provider]  PROAIR HFA 108 (90 Base) MCG/ACT inhaler Inhale 90 mcg into the lungs 4 (four) times daily as needed. 1-2 puffs. 07/19/17   [provider]  rOPINIRole (REQUIP) 1 MG tablet Take 1 tab po tid for restless leg syndrome 12/28/17   Lyndon Code, MD    Allergies Haldol [haloperidol lactate]; Orencia [abatacept]; Sulfa antibiotics; Azathioprine; Indocin [indomethacin]; Levaquin [levofloxacin in d5w]; Methotrexate derivatives; Prozac [fluoxetine hcl]; Remicade [infliximab]; Robaxin [methocarbamol]; Tizanidine; Zanaflex [tizanidine hcl]; and Adalimumab  Family History  Problem Relation Age of Onset  . Heart failure Father   . Hypertension Father   . Diabetes Sister     Social History Social History   Tobacco Use  . Smoking status: Current Every Day Smoker    Packs/day: 1.00    Years: 35.00    Pack years: 35.00  . Smokeless tobacco: Never Used  Substance Use Topics  . Alcohol use: No  . Drug use: No    Review of Systems Constitutional: No fever/chills Eyes: No visual changes. ENT: No sore throat. Cardiovascular: Denies chest pain. Respiratory: Denies shortness of breath. Gastrointestinal: No abdominal pain.  No nausea, no vomiting.  No diarrhea.  No constipation. Genitourinary: Negative for dysuria. Musculoskeletal: Negative for neck pain.  Negative for back pain. Integumentary: Negative for rash. Neurological: Negative for headaches, focal weakness or numbness.   ____________________________________________   PHYSICAL EXAM:  VITAL SIGNS: ED Triage Vitals  Enc Vitals Group      BP 03/01/18 0222 (!) 114/51     Pulse Rate 03/01/18 0222 83     Resp 03/01/18 0222 (!) 22     Temp --      Temp src --      SpO2 03/01/18 0222 97 %     Weight 03/01/18 0224 67.1 kg (148 lb)     Height 03/01/18 0224 1.727 m (5\' 8" )     Head Circumference --      Peak Flow --      Pain Score 03/01/18 0223 0     Pain Loc --      Pain Edu? --      Excl. in GC? --     Constitutional: Alert and oriented. Well appearing and in no acute distress. Eyes: Conjunctivae are normal.  Head: Atraumatic. Mouth/Throat: Mucous membranes are moist. Oropharynx non-erythematous. Neck: No stridor.   Cardiovascular: Normal rate, regular rhythm. Good peripheral circulation. Grossly normal heart sounds. Respiratory: Normal respiratory effort.  No retractions. Lungs CTAB. Gastrointestinal: Soft and nontender. No distention.  Musculoskeletal: No lower extremity tenderness nor edema. No gross deformities of extremities. Neurologic:  Normal speech and language. No gross focal neurologic deficits are appreciated.  Skin:  Skin is warm, dry and intact. No rash noted. Psychiatric: Mood and affect are normal. Speech and behavior are normal.     Procedures   ____________________________________________   INITIAL IMPRESSION / ASSESSMENT AND PLAN /  ED COURSE  As part of my medical decision making, I reviewed the following data within the electronic MEDICAL RECORD NUMBER   57 year old female presented with above-stated history and physical exam secondary to nausea vomiting and diarrhea.  Patient given Imodium 4 mg Zofran 4 mg IV and 2 L IV normal saline. Suspect infectious gastroenteritis as etiology.  Stool sample pending at this time ____________________________________________  FINAL CLINICAL IMPRESSION(S) / ED DIAGNOSES  Final diagnoses:  Gastroenteritis     MEDICATIONS GIVEN DURING THIS VISIT:  Medications  ondansetron (ZOFRAN) injection 4 mg (4 mg Intravenous Given 03/01/18 0232)  loperamide  (IMODIUM) capsule 4 mg (4 mg Oral Given 03/01/18 0231)  sodium chloride 0.9 % bolus 1,000 mL (0 mLs Intravenous Stopped 03/01/18 0349)     ED Discharge Orders    None       Note:  This document was prepared using Dragon voice recognition software and may include unintentional dictation errors.    Darci Current, MD 03/01/18 [phone]

## 2018-03-08 ENCOUNTER — Ambulatory Visit: Payer: Self-pay | Admitting: Nurse Practitioner

## 2018-03-08 DIAGNOSIS — M059 Rheumatoid arthritis with rheumatoid factor, unspecified: Secondary | ICD-10-CM | POA: Diagnosis not present

## 2018-03-08 DIAGNOSIS — G629 Polyneuropathy, unspecified: Secondary | ICD-10-CM | POA: Diagnosis not present

## 2018-03-08 DIAGNOSIS — I10 Essential (primary) hypertension: Secondary | ICD-10-CM | POA: Diagnosis not present

## 2018-03-27 DIAGNOSIS — Z79899 Other long term (current) drug therapy: Secondary | ICD-10-CM | POA: Diagnosis not present

## 2018-03-27 DIAGNOSIS — G894 Chronic pain syndrome: Secondary | ICD-10-CM | POA: Diagnosis not present

## 2018-03-27 DIAGNOSIS — Z79891 Long term (current) use of opiate analgesic: Secondary | ICD-10-CM | POA: Diagnosis not present

## 2018-03-27 DIAGNOSIS — M069 Rheumatoid arthritis, unspecified: Secondary | ICD-10-CM | POA: Diagnosis not present

## 2018-03-27 DIAGNOSIS — M961 Postlaminectomy syndrome, not elsewhere classified: Secondary | ICD-10-CM | POA: Diagnosis not present

## 2018-04-18 ENCOUNTER — Telehealth: Payer: Self-pay

## 2018-04-18 NOTE — Telephone Encounter (Signed)
Gave signed order for medical social work evaluation and the home health certification and plan of care to [facility]

## 2018-04-20 ENCOUNTER — Other Ambulatory Visit: Payer: Self-pay

## 2018-04-20 DIAGNOSIS — F333 Major depressive disorder, recurrent, severe with psychotic symptoms: Secondary | ICD-10-CM

## 2018-04-20 MED ORDER — ESCITALOPRAM OXALATE 20 MG PO TABS: ORAL_TABLET | ORAL | 0 refills | Status: AC

## 2018-04-23 ENCOUNTER — Other Ambulatory Visit: Payer: Self-pay

## 2018-04-23 MED ORDER — HYDROCHLOROTHIAZIDE 25 MG PO TABS: 25.0000 mg | ORAL_TABLET | Freq: Every day | ORAL | 0 refills | Status: AC

## 2018-04-24 ENCOUNTER — Other Ambulatory Visit: Payer: Self-pay

## 2018-04-24 DIAGNOSIS — Z79899 Other long term (current) drug therapy: Secondary | ICD-10-CM | POA: Diagnosis not present

## 2018-04-24 DIAGNOSIS — M961 Postlaminectomy syndrome, not elsewhere classified: Secondary | ICD-10-CM | POA: Diagnosis not present

## 2018-04-24 DIAGNOSIS — Z79891 Long term (current) use of opiate analgesic: Secondary | ICD-10-CM | POA: Diagnosis not present

## 2018-04-24 DIAGNOSIS — M069 Rheumatoid arthritis, unspecified: Secondary | ICD-10-CM | POA: Diagnosis not present

## 2018-04-24 DIAGNOSIS — G894 Chronic pain syndrome: Secondary | ICD-10-CM | POA: Diagnosis not present

## 2018-04-24 MED ORDER — ROPINIROLE HCL 1 MG PO TABS: ORAL_TABLET | ORAL | 0 refills | Status: AC

## 2018-05-06 IMAGING — MR MR HEAD W/O CM
11 series · 40 of 48 positions shown · non-contrast
Comparison: Head CT from yesterday

CLINICAL DATA: Sudden onset of left leg weakness. Left upper
extremity numbness.

EXAM:
MRI HEAD WITHOUT CONTRAST
MRA HEAD WITHOUT CONTRAST
TECHNIQUE: Multiplanar, multiecho pulse sequences of the brain and surrounding
structures were obtained without intravenous contrast. Angiographic
images of the head were obtained using MRA technique without
contrast.

[Series 2: GRE · sagittal · 5.0mm · 0.45mm/px · 4 of 27 slices shown (1 of 2)]
[im 1/27]
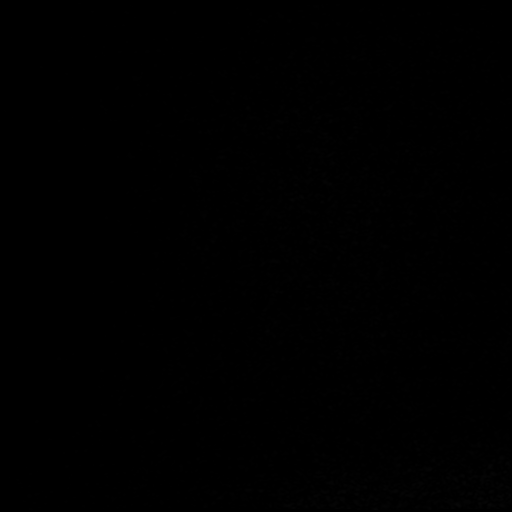
[im 9/27]
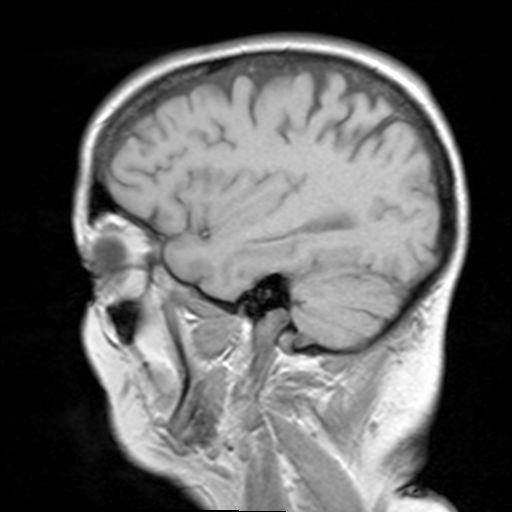
[im 18/27]
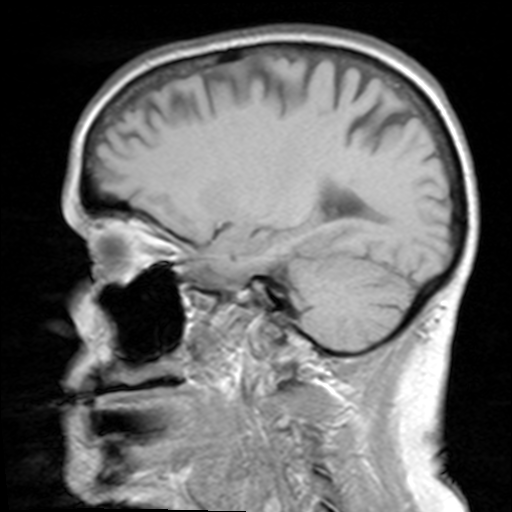
[im 27/27]
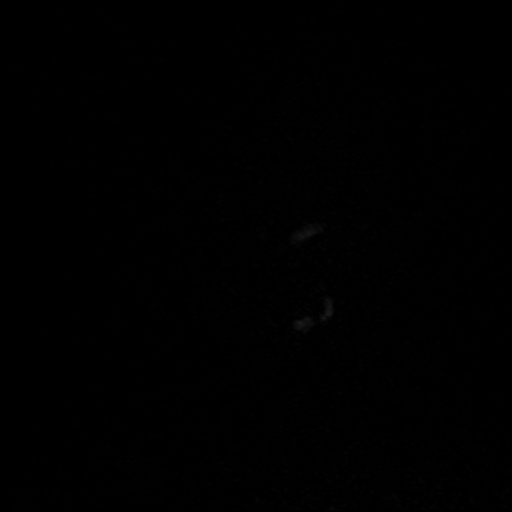

[Series 4: DWI · axial · 3.0mm · 1.80mm/px · z∈[-70,+92]mm · 5 of 55 slices shown (1 of 4)]
[im 1/55]
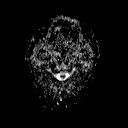
[im 14/55]
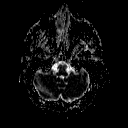
[im 28/55]
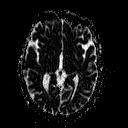
[im 41/55]
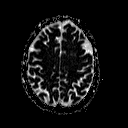
[im 55/55]
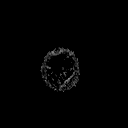

[Series 6: DWI · coronal · 3.0mm · 1.80mm/px · 5 of 49 slices shown (2 of 4)]
[im 1/49]
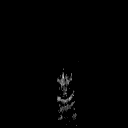
[im 13/49]
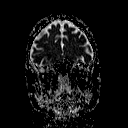
[im 25/49]
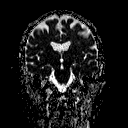
[im 37/49]
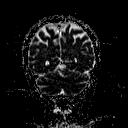
[im 49/49]
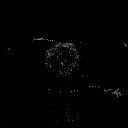

[Series 7: T2 · axial · 5.0mm · 0.45mm/px · z∈[-52,+103]mm · 2 of 25 slices shown (1 of 3)]
[im 1/25]
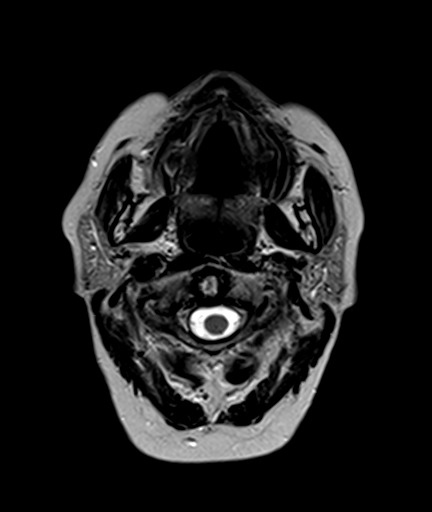
[im 25/25]
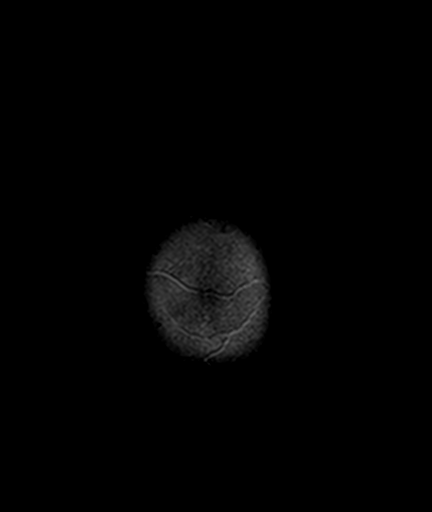

[Series 8: FLAIR · axial · 5.0mm · 0.45mm/px · z∈[-52,+103]mm · 2 of 25 slices shown]
[im 1/25]
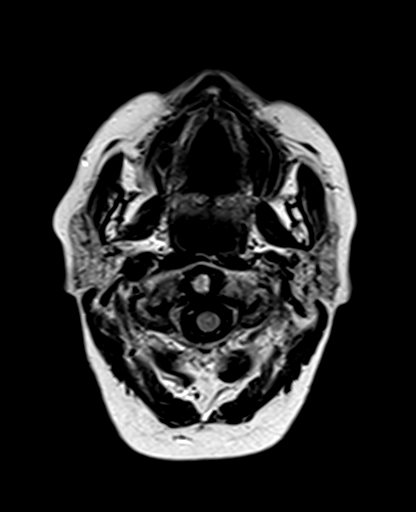
[im 25/25]
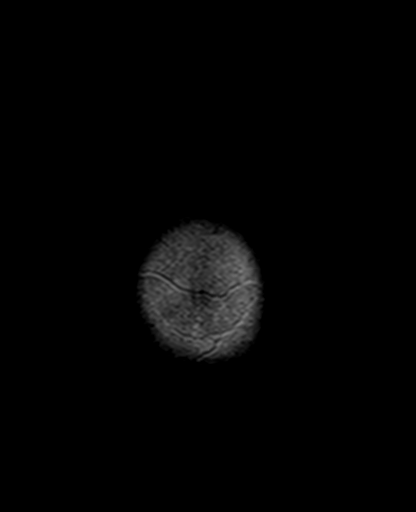

[Series 9: TOF · axial · non-contrast · 0.7mm · 0.37mm/px · z∈[-47,+5]mm · 5 of 130 slices shown]
[im 1/130]
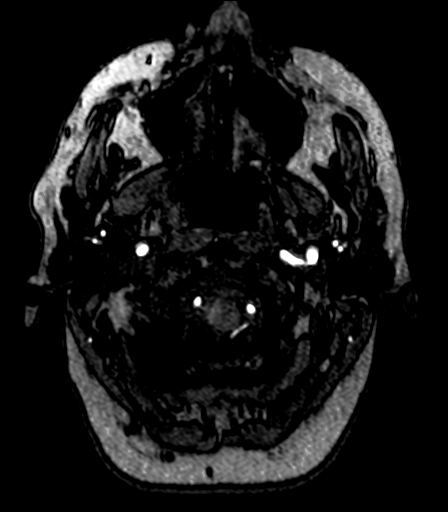
[im 22/130]
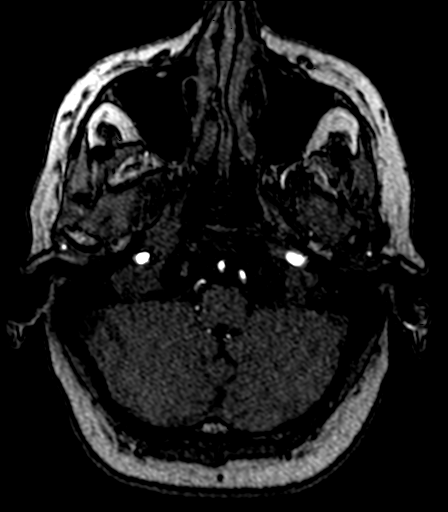
[im 44/130]
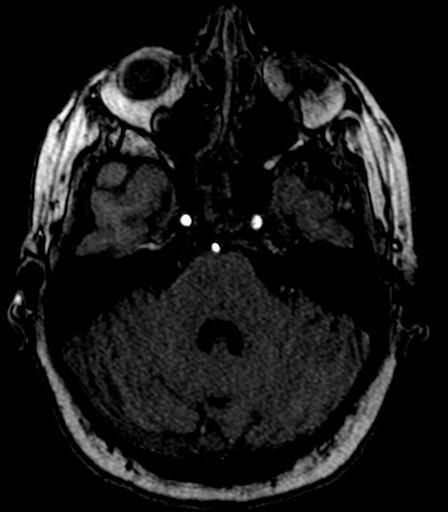
[im 54/130]
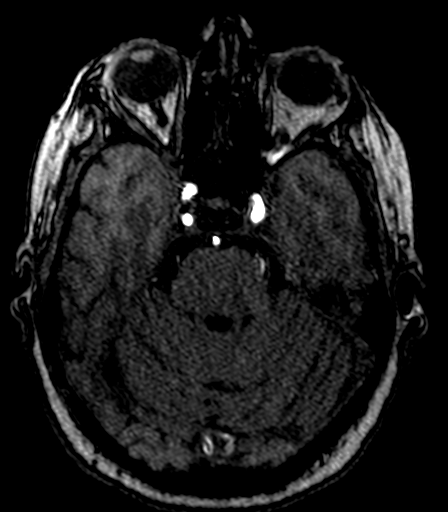
[im 76/130]
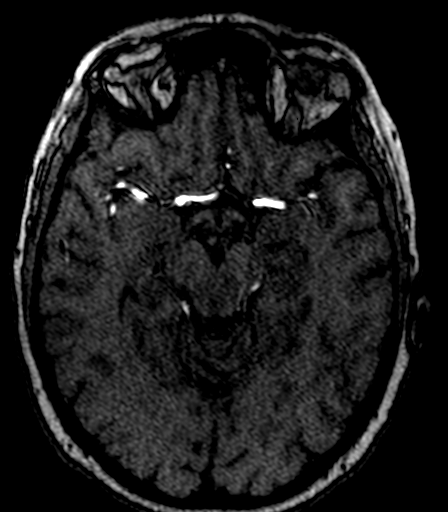

[Series 13: T2 · axial · 5.0mm · 1.20mm/px · z∈[-52,+103]mm · 2 of 25 slices shown (2 of 3)]
[im 1/25]
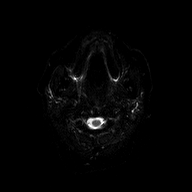
[im 25/25]
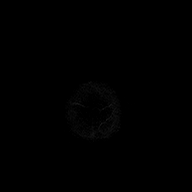

[Series 14: GRE · axial · 5.0mm · 0.45mm/px · z∈[-52,+103]mm · 2 of 25 slices shown (2 of 2)]
[im 1/25]
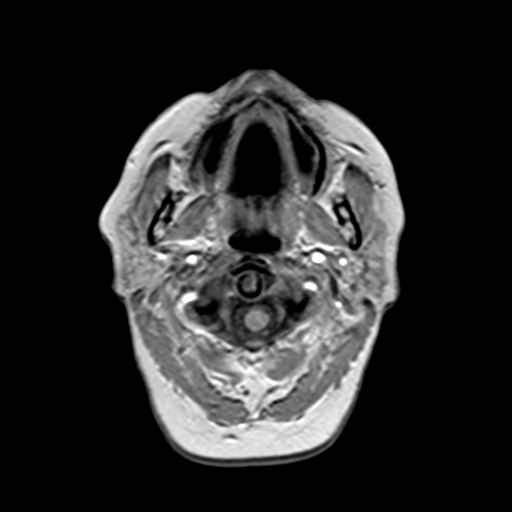
[im 25/25]
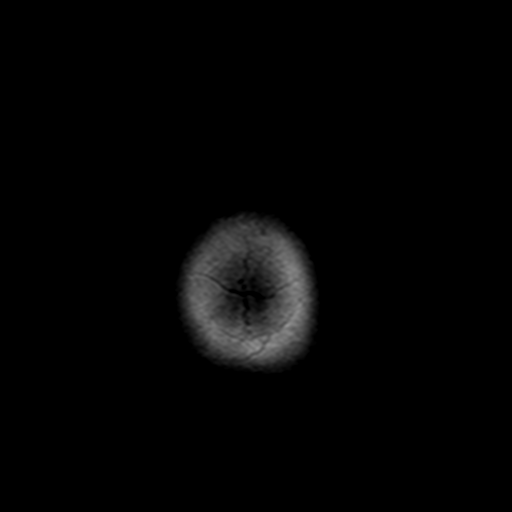

[Series 15: T2 · coronal · 5.0mm · 0.45mm/px · 3 of 27 slices shown (3 of 3)]
[im 1/27]
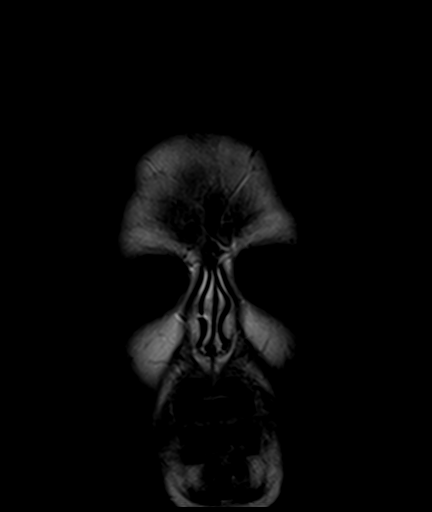
[im 14/27]
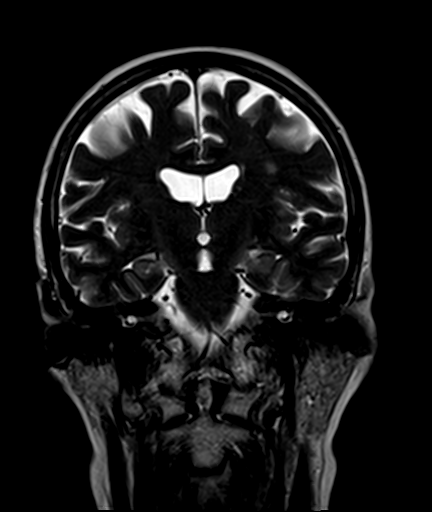
[im 27/27]
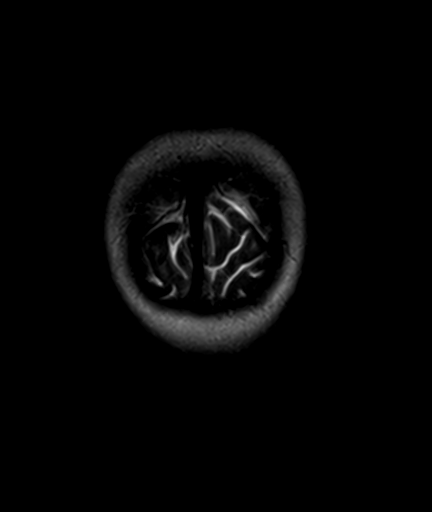

[Series 100: DWI · axial · 3.0mm · 1.80mm/px · z∈[-70,+92]mm · 5 of 55 slices shown (3 of 4)]
[im 1/55]
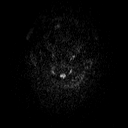
[im 14/55]
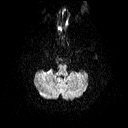
[im 28/55]
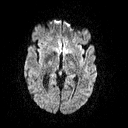
[im 41/55]
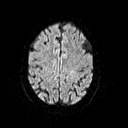
[im 55/55]
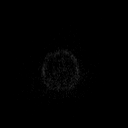

[Series 101: DWI · coronal · 3.0mm · 1.80mm/px · 5 of 46 slices shown (4 of 4)]
[im 1/46]
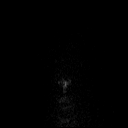
[im 12/46]
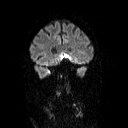
[im 23/46]
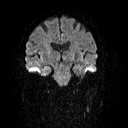
[im 34/46]
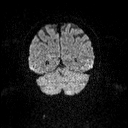
[im 46/46]
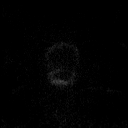

[40 of 48 positions shown; findings below may reference images not displayed]

FINDINGS: MRI HEAD FINDINGS

Brain: No acute infarction, hemorrhage, hydrocephalus, extra-axial
collection or mass lesion.

10-20 foci of FLAIR hyperintensity in the periventricular, deep, and
juxta cortical cerebral white matter. Subtle patchy FLAIR
hyperintensity in the pons. None of these areas restrict diffusion.

Normal brain volume.

Vascular: Arterial findings below. Normal dural venous sinus flow
voids.

Skull and upper cervical spine: Negative for marrow lesion.

Sinuses/Orbits: Chronic right sphenoid sinusitis with sclerotic wall
thickening and circumferential mucosal edema.

MRA HEAD FINDINGS

Symmetric carotid and vertebral arteries. Intact circle-of-Willis.
Apparent proximal right M2 branch narrowing is not convincing on
source images. No treatable stenosis or major branch occlusion. No
vessel beading or aneurysm.
IMPRESSION: 1. No acute finding, including infarct.
2. Nonspecific signal abnormalities in the cerebral white matter as
seen with chronic microvascular disease or demyelination.
3. Negative intracranial MRA.
4. Chronic right sphenoid sinusitis.

## 2018-05-17 ENCOUNTER — Other Ambulatory Visit: Payer: Self-pay | Admitting: Internal Medicine

## 2018-05-22 DIAGNOSIS — G894 Chronic pain syndrome: Secondary | ICD-10-CM | POA: Diagnosis not present

## 2018-05-22 DIAGNOSIS — Z79891 Long term (current) use of opiate analgesic: Secondary | ICD-10-CM | POA: Diagnosis not present

## 2018-05-22 DIAGNOSIS — M961 Postlaminectomy syndrome, not elsewhere classified: Secondary | ICD-10-CM | POA: Diagnosis not present

## 2018-05-22 DIAGNOSIS — Z79899 Other long term (current) drug therapy: Secondary | ICD-10-CM | POA: Diagnosis not present

## 2018-05-22 DIAGNOSIS — M069 Rheumatoid arthritis, unspecified: Secondary | ICD-10-CM | POA: Diagnosis not present

## 2018-06-01 DIAGNOSIS — G8929 Other chronic pain: Secondary | ICD-10-CM | POA: Diagnosis not present

## 2018-06-01 DIAGNOSIS — Z1389 Encounter for screening for other disorder: Secondary | ICD-10-CM | POA: Diagnosis not present

## 2018-06-01 DIAGNOSIS — Z0000 Encounter for general adult medical examination without abnormal findings: Secondary | ICD-10-CM | POA: Diagnosis not present

## 2018-06-01 DIAGNOSIS — G2581 Restless legs syndrome: Secondary | ICD-10-CM | POA: Diagnosis not present

## 2018-06-21 DIAGNOSIS — Z79891 Long term (current) use of opiate analgesic: Secondary | ICD-10-CM | POA: Diagnosis not present

## 2018-06-21 DIAGNOSIS — M069 Rheumatoid arthritis, unspecified: Secondary | ICD-10-CM | POA: Diagnosis not present

## 2018-06-21 DIAGNOSIS — M961 Postlaminectomy syndrome, not elsewhere classified: Secondary | ICD-10-CM | POA: Diagnosis not present

## 2018-06-21 DIAGNOSIS — Z79899 Other long term (current) drug therapy: Secondary | ICD-10-CM | POA: Diagnosis not present

## 2018-06-21 DIAGNOSIS — G894 Chronic pain syndrome: Secondary | ICD-10-CM | POA: Diagnosis not present

## 2018-07-06 DIAGNOSIS — Z0000 Encounter for general adult medical examination without abnormal findings: Secondary | ICD-10-CM | POA: Diagnosis not present

## 2018-07-06 DIAGNOSIS — G8929 Other chronic pain: Secondary | ICD-10-CM | POA: Diagnosis not present

## 2018-07-19 DIAGNOSIS — Z79891 Long term (current) use of opiate analgesic: Secondary | ICD-10-CM | POA: Diagnosis not present

## 2018-07-19 DIAGNOSIS — Z79899 Other long term (current) drug therapy: Secondary | ICD-10-CM | POA: Diagnosis not present

## 2018-07-19 DIAGNOSIS — M961 Postlaminectomy syndrome, not elsewhere classified: Secondary | ICD-10-CM | POA: Diagnosis not present

## 2018-07-19 DIAGNOSIS — G894 Chronic pain syndrome: Secondary | ICD-10-CM | POA: Diagnosis not present

## 2018-07-19 DIAGNOSIS — M069 Rheumatoid arthritis, unspecified: Secondary | ICD-10-CM | POA: Diagnosis not present

## 2018-08-22 IMAGING — CR DG HIP (WITH OR WITHOUT PELVIS) 2-3V*R*
1 series · 3 of 3 positions shown · non-contrast
Comparison: None.

CLINICAL DATA: Felt pop in hip yesterday.

EXAM:
DG HIP (WITH OR WITHOUT PELVIS) 2-3V RIGHT

[Series 1: t pelvis ap · 0.14mm/px · 3 of 3 slices shown]
[im 1/3]
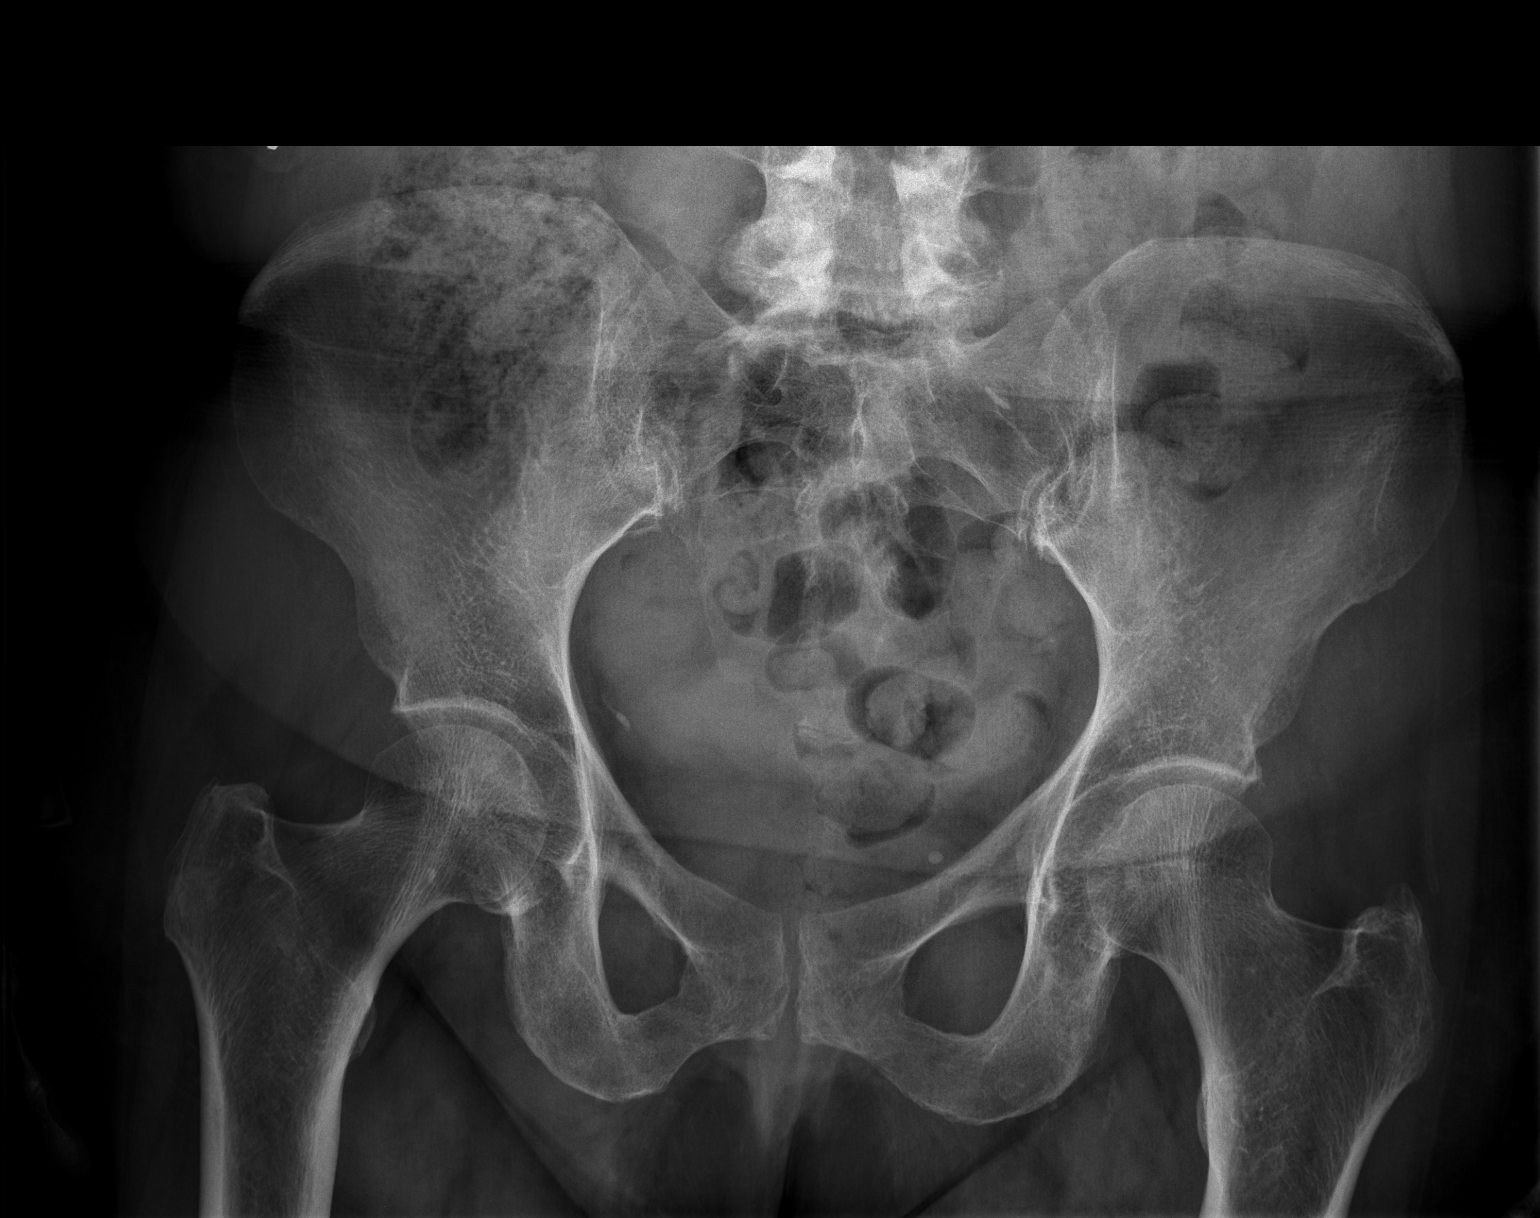
[im 2/3]
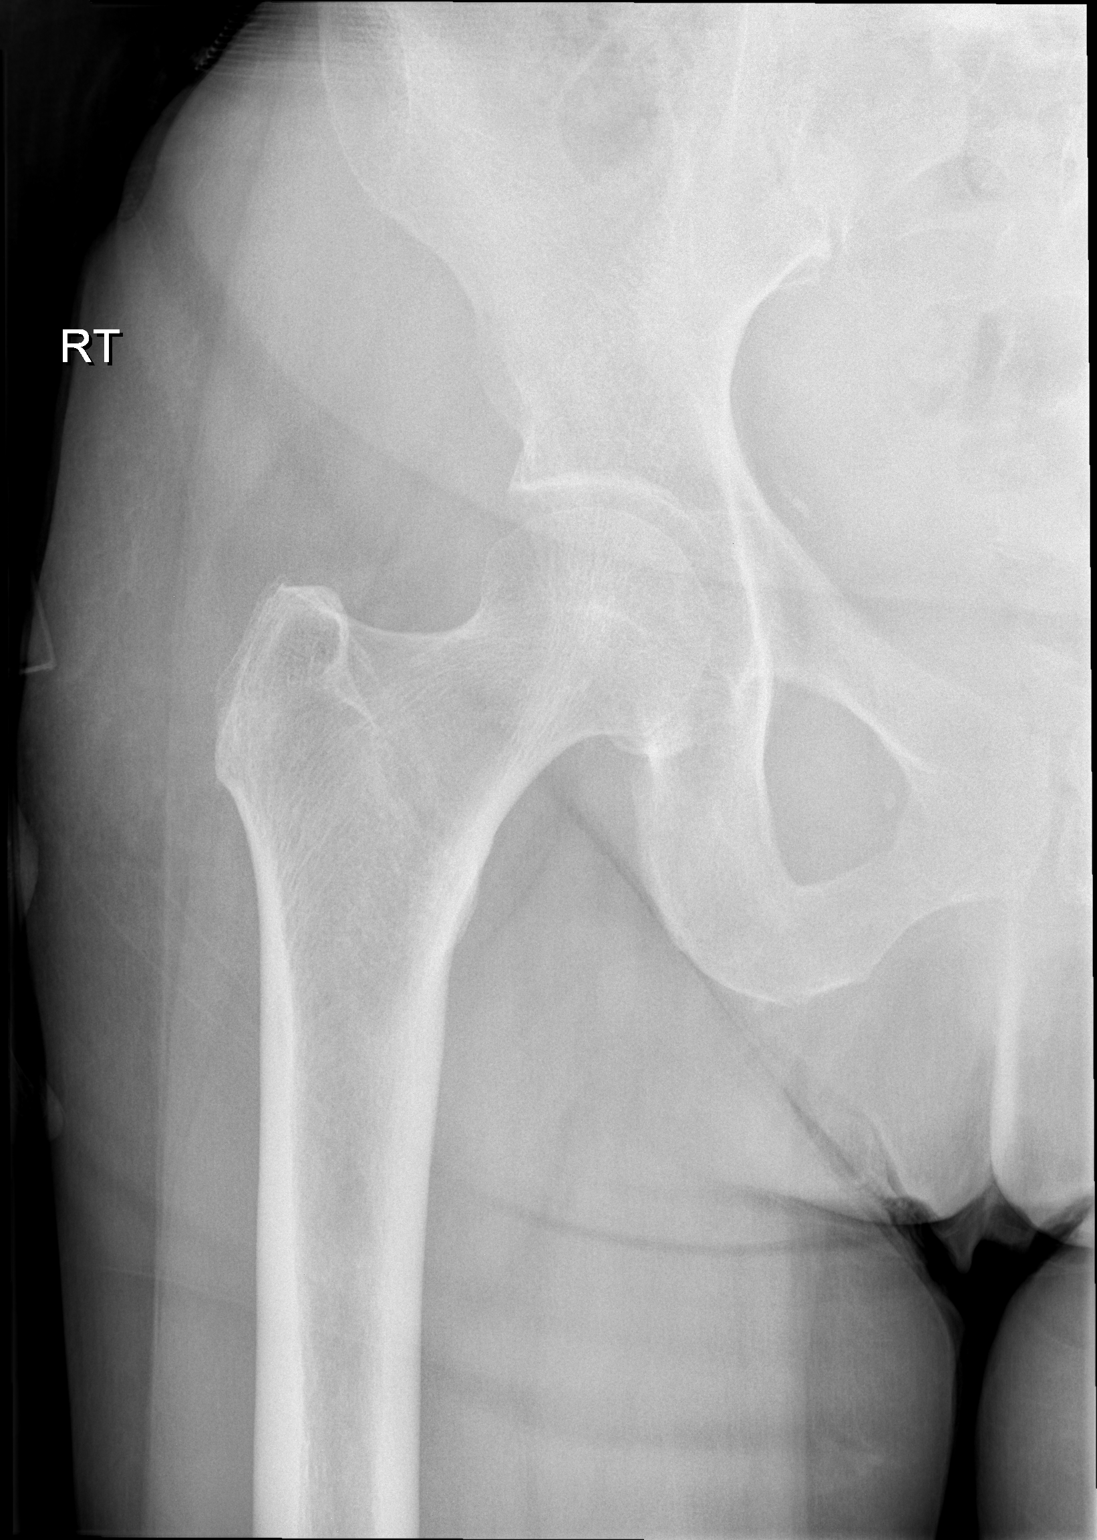
[im 3/3]
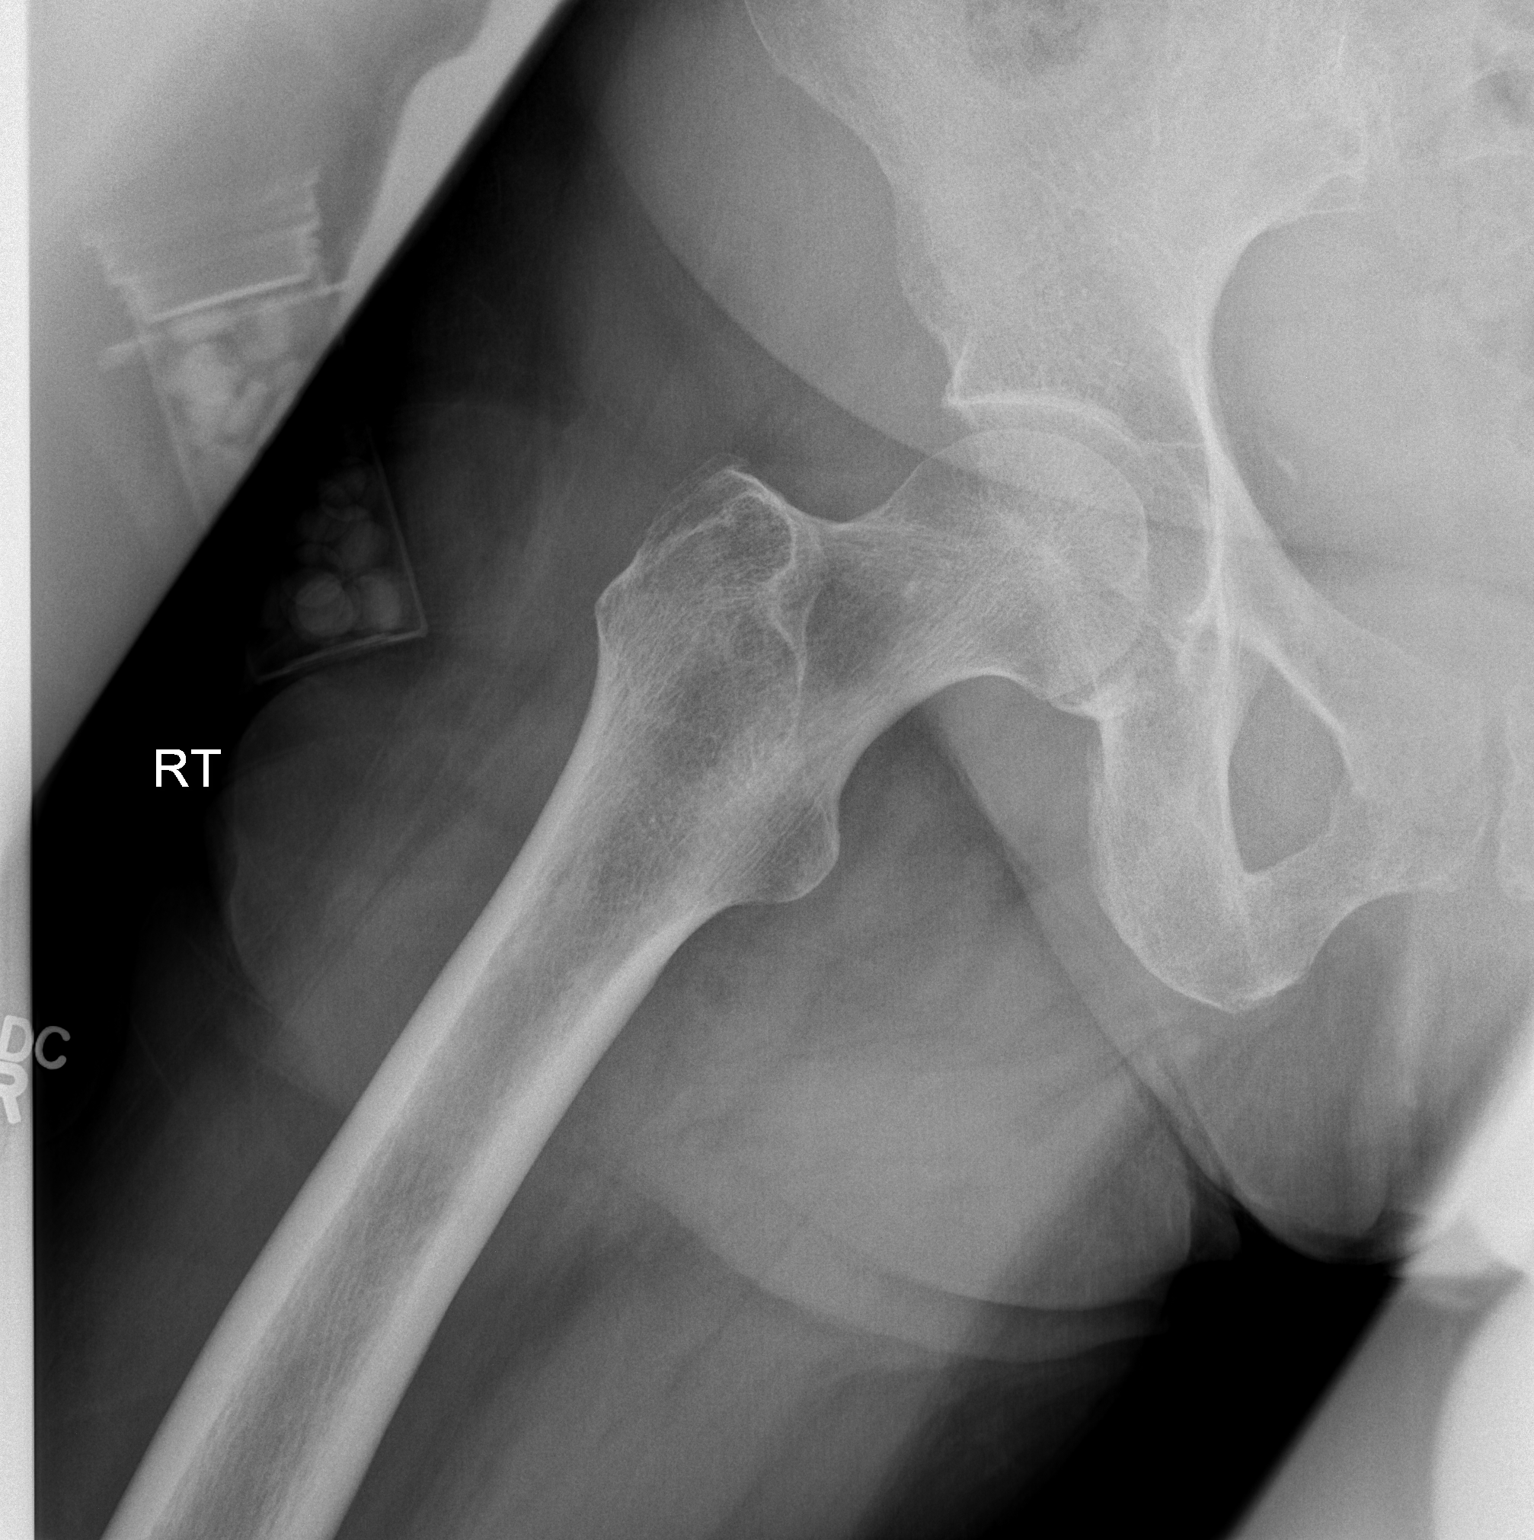

[3 of 3 positions shown; findings below may reference images not displayed]

FINDINGS: There is no evidence of hip fracture or dislocation. There is no
evidence of arthropathy or other focal bone abnormality.
IMPRESSION: Negative.

## 2018-09-06 DIAGNOSIS — Z79899 Other long term (current) drug therapy: Secondary | ICD-10-CM | POA: Diagnosis not present

## 2018-09-06 DIAGNOSIS — G894 Chronic pain syndrome: Secondary | ICD-10-CM | POA: Diagnosis not present

## 2018-09-06 DIAGNOSIS — Z79891 Long term (current) use of opiate analgesic: Secondary | ICD-10-CM | POA: Diagnosis not present

## 2018-09-06 DIAGNOSIS — M961 Postlaminectomy syndrome, not elsewhere classified: Secondary | ICD-10-CM | POA: Diagnosis not present

## 2018-09-08 ENCOUNTER — Other Ambulatory Visit: Payer: Self-pay

## 2018-09-08 ENCOUNTER — Emergency Department
Admission: EM | Admit: 2018-09-08 | Discharge: 2018-09-08 | Disposition: A | Discharge status: Home / Self Care | Payer: Medicare Other | Attending: Emergency Medicine | Admitting: Emergency Medicine

## 2018-09-08 DIAGNOSIS — Z79899 Other long term (current) drug therapy: Secondary | ICD-10-CM | POA: Diagnosis not present

## 2018-09-08 DIAGNOSIS — R451 Restlessness and agitation: Secondary | ICD-10-CM | POA: Diagnosis not present

## 2018-09-08 DIAGNOSIS — R443 Hallucinations, unspecified: Secondary | ICD-10-CM | POA: Diagnosis present

## 2018-09-08 DIAGNOSIS — F17210 Nicotine dependence, cigarettes, uncomplicated: Secondary | ICD-10-CM | POA: Diagnosis not present

## 2018-09-08 DIAGNOSIS — F1123 Opioid dependence with withdrawal: Secondary | ICD-10-CM

## 2018-09-08 DIAGNOSIS — F1193 Opioid use, unspecified with withdrawal: Secondary | ICD-10-CM

## 2018-09-08 DIAGNOSIS — I10 Essential (primary) hypertension: Secondary | ICD-10-CM | POA: Insufficient documentation

## 2018-09-08 LAB — COMPREHENSIVE METABOLIC PANEL
ALT: 15 U/L (ref 0–44)
ANION GAP: 11 (ref 5–15)
AST: 19 U/L (ref 15–41)
Albumin: 3.4 g/dL — ABNORMAL LOW (ref 3.5–5.0)
Alkaline Phosphatase: 87 U/L (ref 38–126)
BILIRUBIN TOTAL: 0.5 mg/dL (ref 0.3–1.2)
BUN: 17 mg/dL (ref 6–20)
CO2: 28 mmol/L (ref 22–32)
Calcium: 8.9 mg/dL (ref 8.9–10.3)
Chloride: 98 mmol/L (ref 98–111)
Creatinine, Ser: 0.72 mg/dL (ref 0.44–1.00)
GFR calc non Af Amer: >60 mL/min (ref >60)
Glucose, Bld: 176 mg/dL — ABNORMAL HIGH (ref 70–99)
POTASSIUM: 2.7 mmol/L — AB (ref 3.5–5.1)
Sodium: 137 mmol/L (ref 135–145)
TOTAL PROTEIN: 7.2 g/dL (ref 6.5–8.1)

## 2018-09-08 LAB — CBC
HEMATOCRIT: 39.7 % (ref 36.0–46.0)
Hemoglobin: 12.9 g/dL (ref 12.0–15.0)
MCH: 29.9 pg (ref 26.0–34.0)
MCHC: 32.5 g/dL (ref 30.0–36.0)
MCV: 92.1 fL (ref 80.0–100.0)
Platelets: 354 10*3/uL (ref 150–400)
RBC: 4.31 MIL/uL (ref 3.87–5.11)
RDW: 12.9 % (ref 11.5–15.5)
WBC: 7.7 10*3/uL (ref 4.0–10.5)
nRBC: 0 % (ref 0.0–0.2)

## 2018-09-08 LAB — URINALYSIS, COMPLETE (UACMP) WITH MICROSCOPIC
Bilirubin Urine: NEGATIVE
GLUCOSE, UA: NEGATIVE mg/dL
HGB URINE DIPSTICK: NEGATIVE
KETONES UR: NEGATIVE mg/dL
NITRITE: NEGATIVE
PROTEIN: NEGATIVE mg/dL
Specific Gravity, Urine: 1.013 (ref 1.005–1.030)
pH: 6 (ref 5.0–8.0)

## 2018-09-08 LAB — TSH: TSH: 1.071 u[IU]/mL (ref 0.350–4.500)

## 2018-09-08 MED ORDER — POTASSIUM CHLORIDE 20 MEQ PO PACK
40.0000 meq | PACK | Freq: Once | ORAL | Status: AC
  Administered 2018-09-08: 40 meq via ORAL
  Filled 2018-09-08: qty 2

## 2018-09-08 MED ORDER — POTASSIUM CHLORIDE 20 MEQ/15ML (10%) PO SOLN
40.0000 meq | Freq: Once | ORAL | Status: DC
  Filled 2018-09-08: qty 30

## 2018-09-08 MED ORDER — BUPRENORPHINE HCL-NALOXONE HCL 8-2 MG SL SUBL
1.0000 | SUBLINGUAL_TABLET | Freq: Every day | SUBLINGUAL | Status: DC
  Administered 2018-09-08: 1 via SUBLINGUAL
  Filled 2018-09-08: qty 1

## 2018-09-08 NOTE — ED Provider Notes (Signed)
[facility] Emergency Department Provider Note ___   First MD Initiated Contact with Patient 09/08/18 0231     (approximate)  I have reviewed the triage vital signs and the nursing notes.   HISTORY  Chief Complaint Hallucinations and Withdrawal    HPI Karen Chen is a 58 y.o. female with below list of chronic medical conditions including chronic pain presents to the emergency department with restlessness visual hallucinations agitation inability to sleep.  Patient states that her methadone and oxycodone were discontinued this past Tuesday and that symptoms have progressively worsened since then.  Patient denies any hallucinations at present.  Patient states that she has an appointment on Tuesday and that Suboxone was good to be started then.  Past Medical History:  Diagnosis Date  . Anxiety   . Chronic lower back pain   . Hypertension   . Restless leg   . Rheumatoid arthritis [facility](HCC)     Patient Active Problem List   Diagnosis Date Noted  . Encephalopathy 12/21/2017  . Myalgia 08/23/2017  . Tremor, unspecified 08/23/2017  . Insomnia due to other mental disorder 08/23/2017  . Hypotension, unspecified 08/23/2017  . Major depressive disorder, recurrent, severe with psychotic symptoms (HCC) 08/23/2017  . Wheezing 08/23/2017  . Tinea pedis 08/23/2017  . Mild intermittent asthma, uncomplicated 08/23/2017  . Primary generalized (osteo)arthritis 08/23/2017  . Nausea 08/23/2017  . Peripheral vascular disease (HCC) 08/23/2017  . Generalized anxiety disorder 08/23/2017  . Nicotine dependence, cigarettes, uncomplicated 08/23/2017  . Hypokalemia 04/24/2017  . Vitamin D insufficiency 04/24/2017  . Failed back surgical syndrome 03/09/2017  . Grade II Anterolisthesis of L4 over L5 03/09/2017  . Epidural fibrosis 03/09/2017  . Chronic L4 bilateral pars defect 03/09/2017  . L2-3 and L3-4 intervertebral disc extrusion 03/09/2017  . Abnormal MRI, lumbar  spine (09/28/2016) 03/09/2017  . Lumbar facet hypertrophy 03/09/2017  . Lumbar facet syndrome (Bilateral) (L>R) 03/09/2017  . Lumbar L3-4 lateral recess stenosis (Bilateral) (L>R) 03/09/2017  . Chronic pain syndrome 03/07/2017  . Long-term use of high-risk medication 03/07/2017  . Long term prescription benzodiazepine use 03/07/2017  . Chronic low back pain (Primary Source of Pain) (Bilateral) (L>R) 03/07/2017  . Chronic lower extremity pain (Secondary source of pain) (Bilateral) (L>R) 03/07/2017  . Chronic hand pain [facility](Tertiary source of pain) (Bilateral) (R>L) 03/07/2017  . Chronic neck pain (Posterior) (Bilateral) (R>L) 03/07/2017  . Chronic shoulder pain (Bilateral) (R>L) 03/07/2017  . Chronic foot pain (Bilateral) (R>L) 03/07/2017  . TIA (transient ischemic attack) 09/16/2016  . CVA (cerebral vascular accident) (HCC) 09/15/2016  . Rheumatoid arthritis involving multiple sites with positive rheumatoid factor (HCC) 05/04/2016  . ADHD (attention deficit hyperactivity disorder) 03/25/2015  . Bite from insect 03/25/2015  . PTSD (post-traumatic stress disorder) 03/25/2015  . Anxiety 03/26/2014  . Hypertension 03/26/2014  . Restless leg 09/20/2013    Past Surgical History:  Procedure Laterality Date  . BACK SURGERY  1980    Prior to Admission medications   Medication Sig Start Date End Date Taking? Authorizing Provider  amoxicillin-clavulanate (AUGMENTIN) 875-125 MG tablet Take 1 tablet by mouth every 12 (twelve) hours. Patient not taking: Reported on 09/08/2018 12/26/17   Alford HighlandWieting, Richard, MD  aspirin 81 MG tablet Take 81 mg by mouth daily.    [provider]  Calcium Carb-Cholecalciferol (CALCIUM PLUS D3 ABSORBABLE) [phone] MG-UNIT CAPS Take 1 capsule by mouth daily with breakfast. 04/24/17 07/23/17  Delano MetzNaveira, Francisco, MD  escitalopram (LEXAPRO) 20 MG tablet 2 tabs po daily 04/20/18  Carlean Jews, NP  gabapentin (NEURONTIN) 300 MG capsule Take 2 capsules (600 mg total)  by mouth 2 (two) times daily. 12/26/17   Alford Highland, MD  hydrochlorothiazide (HYDRODIURIL) 25 MG tablet Take 1 tablet (25 mg total) by mouth daily. 04/23/18   Carlean Jews, NP  Multiple Vitamin (MULTIVITAMIN WITH MINERALS) TABS tablet Take 1 tablet by mouth daily. 12/26/17   Alford Highland, MD  ondansetron (ZOFRAN ODT) 4 MG disintegrating tablet Take 1 tablet (4 mg total) by mouth every 8 (eight) hours as needed for nausea or vomiting. 03/01/18   Darci Current, MD  oxyCODONE-acetaminophen (PERCOCET/ROXICET) 5-325 MG tablet Take 1 tablet by mouth every 8 (eight) hours as needed.     [provider]  potassium chloride (K-DUR) 10 MEQ tablet Take 2 tablets (20 mEq total) by mouth daily. Take two tablets. 11/07/17   Carlean Jews, NP  predniSONE (DELTASONE) 5 MG tablet Take 5 mg by mouth 2 (two) times daily with a meal.    [provider]  PROAIR HFA 108 (90 Base) MCG/ACT inhaler Inhale 90 mcg into the lungs 4 (four) times daily as needed. 1-2 puffs. 07/19/17   [provider]  rOPINIRole (REQUIP) 1 MG tablet Take 1 tab po tid for restless leg syndrome 04/24/18   Lyndon Code, MD    Allergies Haldol [haloperidol lactate]; Orencia [abatacept]; Sulfa antibiotics; Azathioprine; Indocin [indomethacin]; Levaquin [levofloxacin in d5w]; Methotrexate derivatives; Prozac [fluoxetine hcl]; Remicade [infliximab]; Robaxin [methocarbamol]; Tizanidine; Zanaflex [tizanidine hcl]; and Adalimumab  Family History  Problem Relation Age of Onset  . Heart failure Father   . Hypertension Father   . Diabetes Sister     Social History Social History   Tobacco Use  . Smoking status: Current Every Day Smoker    Packs/day: 1.00    Years: 35.00    Pack years: 35.00  . Smokeless tobacco: Never Used  Substance Use Topics  . Alcohol use: No  . Drug use: No    Review of Systems Constitutional: No fever/chills Eyes: No visual changes. ENT: No sore throat. Cardiovascular:  Denies chest pain. Respiratory: Denies shortness of breath. Gastrointestinal: No abdominal pain.  No nausea, no vomiting.  No diarrhea.  No constipation. Genitourinary: Negative for dysuria. Musculoskeletal: Negative for neck pain.  Negative for back pain. Integumentary: Negative for rash. Neurological: Negative for headaches, focal weakness or numbness. Psychiatric:Positive for visual hallucinations and agitation  ____________________________________________   PHYSICAL EXAM:  VITAL SIGNS: ED Triage Vitals  Enc Vitals Group     BP 09/08/18 0127 (!) 163/74     Pulse Rate 09/08/18 0127 77     Resp 09/08/18 0127 17     Temp 09/08/18 0127 98.1 F (36.7 C)     Temp Source 09/08/18 0127 Oral     SpO2 09/08/18 0127 100 %     Weight 09/08/18 0126 59 kg (130 lb)     Height 09/08/18 0126 1.727 m (5\' 8" )     Head Circumference --      Peak Flow --      Pain Score 09/08/18 0126 0     Pain Loc --      Pain Edu? --      Excl. in GC? --     Constitutional: Alert and oriented. Well appearing and in no acute distress. Eyes: Conjunctivae are normal.  Mouth/Throat: Mucous membranes are moist.  Oropharynx non-erythematous. Neck: No stridor.   Cardiovascular: Normal rate, regular rhythm. Good peripheral circulation. Grossly normal  heart sounds. Respiratory: Normal respiratory effort.  No retractions. Lungs CTAB. Gastrointestinal: Soft and nontender. No distention.  Musculoskeletal: No lower extremity tenderness nor edema. No gross deformities of extremities. Neurologic:  Normal speech and language. No gross focal neurologic deficits are appreciated.  Skin:  Skin is warm, dry and intact. No rash noted. Psychiatric: Mood and affect are normal. Speech and behavior are normal.  ____________________________________________   LABS (all labs ordered are listed, but only abnormal results are displayed)  Labs Reviewed  COMPREHENSIVE METABOLIC PANEL - Abnormal; Notable for the following  components:      Result Value   Potassium 2.7 (*)    Glucose, Bld 176 (*)    Albumin 3.4 (*)    All other components within normal limits  URINALYSIS, COMPLETE (UACMP) WITH MICROSCOPIC - Abnormal; Notable for the following components:   Color, Urine YELLOW (*)    APPearance HAZY (*)    Leukocytes, UA SMALL (*)    Bacteria, UA RARE (*)    All other components within normal limits  CBC  TSH   ____________________________________________  EKG  ED ECG REPORT I, Beulah N Stori Royse, the attending physician, personally viewed and interpreted this ECG.   Date: 09/08/2018  EKG Time: 2:55 AM  Rate: 70  Rhythm: Normal sinus rhythm  Axis: Normal  Intervals: Normal  ST&T Change: None    Procedures   ____________________________________________   INITIAL IMPRESSION / ASSESSMENT AND PLAN / ED COURSE  As part of my medical decision making, I reviewed the following data within the electronic MEDICAL RECORD NUMBER   58 year old female presenting with above-stated history and physical exam concerning for opiate withdrawal.  Patient given a dose of Suboxone here in the emergency department symptoms improvement.  Patient advised to follow-up with pain management clinic as planned. ____________________________________________  FINAL CLINICAL IMPRESSION(S) / ED DIAGNOSES  Final diagnoses:  Opiate withdrawal (HCC)     MEDICATIONS GIVEN DURING THIS VISIT:  Medications  buprenorphine-naloxone (SUBOXONE) 8-2 mg per SL tablet 1 tablet (1 tablet Sublingual Given 09/08/18 0417)  potassium chloride (KLOR-CON) packet 40 mEq (40 mEq Oral Given 09/08/18 0341)  potassium chloride (KLOR-CON) packet 40 mEq (40 mEq Oral Given 09/08/18 [id number])     ED Discharge Orders    None       Note:  This document was prepared using Dragon voice recognition software and may include unintentional dictation errors.    Darci CurrentBrown,  N, MD 09/08/18 [phone]

## 2018-09-08 NOTE — ED Triage Notes (Signed)
Pt arrives to ED via POV from home with c/o visual hallucinations x2 hrs. Pt reports h/x of same last April and states "was told to stop all her medications". Pt reports being taking off Percocets and Methadone by her PCP  Tuesday of this week. Pt reports being up for the last 24 hrs without sleep. Pt denies SI or HI. Pt reports "seeing people (her brother-in-law and sister) that weren't there". Pt able to state correct month and date, able to state location, able to report the correct president.

## 2018-09-08 NOTE — ED Notes (Signed)
Pt reports stopping her percocet and methadone in the last few days. Pt reports not sleeping for the last 24 hours, feeling jittery and having visual hallucinations. Pt denies SI/HI. Pt also reports not taking her potassium as she is not able to swallow her pills due to the size. Dr Manson Passey at the bedside and explained the process of stopping her medications as she reports causing withdrawal sx. Pt understanding.

## 2018-09-12 DIAGNOSIS — Z1389 Encounter for screening for other disorder: Secondary | ICD-10-CM | POA: Diagnosis not present

## 2018-09-12 DIAGNOSIS — Z0000 Encounter for general adult medical examination without abnormal findings: Secondary | ICD-10-CM | POA: Diagnosis not present

## 2018-09-13 DIAGNOSIS — M810 Age-related osteoporosis without current pathological fracture: Secondary | ICD-10-CM | POA: Diagnosis not present

## 2018-09-13 DIAGNOSIS — R251 Tremor, unspecified: Secondary | ICD-10-CM | POA: Diagnosis not present

## 2018-09-13 DIAGNOSIS — M5416 Radiculopathy, lumbar region: Secondary | ICD-10-CM | POA: Diagnosis not present

## 2018-09-13 DIAGNOSIS — M059 Rheumatoid arthritis with rheumatoid factor, unspecified: Secondary | ICD-10-CM | POA: Diagnosis not present

## 2018-09-20 ENCOUNTER — Other Ambulatory Visit: Payer: Self-pay | Admitting: Nurse Practitioner

## 2018-09-20 DIAGNOSIS — G894 Chronic pain syndrome: Secondary | ICD-10-CM | POA: Diagnosis not present

## 2018-09-20 DIAGNOSIS — Z79891 Long term (current) use of opiate analgesic: Secondary | ICD-10-CM | POA: Diagnosis not present

## 2018-09-20 DIAGNOSIS — Z79899 Other long term (current) drug therapy: Secondary | ICD-10-CM | POA: Diagnosis not present

## 2018-09-20 DIAGNOSIS — M961 Postlaminectomy syndrome, not elsewhere classified: Secondary | ICD-10-CM | POA: Diagnosis not present

## 2018-09-26 ENCOUNTER — Other Ambulatory Visit: Payer: Self-pay | Admitting: Family Medicine

## 2018-09-26 DIAGNOSIS — N6310 Unspecified lump in the right breast, unspecified quadrant: Secondary | ICD-10-CM

## 2018-09-26 DIAGNOSIS — N6320 Unspecified lump in the left breast, unspecified quadrant: Secondary | ICD-10-CM

## 2018-10-02 DIAGNOSIS — E876 Hypokalemia: Secondary | ICD-10-CM | POA: Diagnosis not present

## 2018-10-02 DIAGNOSIS — Z1159 Encounter for screening for other viral diseases: Secondary | ICD-10-CM | POA: Diagnosis not present

## 2018-10-02 DIAGNOSIS — Z1211 Encounter for screening for malignant neoplasm of colon: Secondary | ICD-10-CM | POA: Diagnosis not present

## 2018-10-02 DIAGNOSIS — R8290 Unspecified abnormal findings in urine: Secondary | ICD-10-CM | POA: Diagnosis not present

## 2018-10-02 DIAGNOSIS — Z0000 Encounter for general adult medical examination without abnormal findings: Secondary | ICD-10-CM | POA: Diagnosis not present

## 2018-10-04 DIAGNOSIS — M961 Postlaminectomy syndrome, not elsewhere classified: Secondary | ICD-10-CM | POA: Diagnosis not present

## 2018-10-04 DIAGNOSIS — G894 Chronic pain syndrome: Secondary | ICD-10-CM | POA: Diagnosis not present

## 2018-10-04 DIAGNOSIS — Z79891 Long term (current) use of opiate analgesic: Secondary | ICD-10-CM | POA: Diagnosis not present

## 2018-10-04 DIAGNOSIS — Z79899 Other long term (current) drug therapy: Secondary | ICD-10-CM | POA: Diagnosis not present

## 2018-10-05 ENCOUNTER — Other Ambulatory Visit: Payer: Self-pay | Admitting: Family Medicine

## 2018-10-05 DIAGNOSIS — Z0000 Encounter for general adult medical examination without abnormal findings: Secondary | ICD-10-CM

## 2018-10-18 ENCOUNTER — Ambulatory Visit
Admission: RE | Admit: 2018-10-18 | Discharge: 2018-10-18 | Disposition: A | Discharge status: Home / Self Care | Payer: Medicare Other | Source: Ambulatory Visit | Attending: Family Medicine | Admitting: Family Medicine

## 2018-10-18 DIAGNOSIS — N6310 Unspecified lump in the right breast, unspecified quadrant: Secondary | ICD-10-CM | POA: Insufficient documentation

## 2018-10-18 DIAGNOSIS — N6320 Unspecified lump in the left breast, unspecified quadrant: Secondary | ICD-10-CM | POA: Insufficient documentation

## 2018-10-18 DIAGNOSIS — N6489 Other specified disorders of breast: Secondary | ICD-10-CM | POA: Insufficient documentation

## 2018-10-18 DIAGNOSIS — R928 Other abnormal and inconclusive findings on diagnostic imaging of breast: Secondary | ICD-10-CM | POA: Diagnosis not present

## 2018-10-29 DIAGNOSIS — R251 Tremor, unspecified: Secondary | ICD-10-CM | POA: Insufficient documentation

## 2018-11-01 DIAGNOSIS — G8929 Other chronic pain: Secondary | ICD-10-CM | POA: Diagnosis not present

## 2018-11-01 DIAGNOSIS — E876 Hypokalemia: Secondary | ICD-10-CM | POA: Diagnosis not present

## 2018-11-01 DIAGNOSIS — R8290 Unspecified abnormal findings in urine: Secondary | ICD-10-CM | POA: Diagnosis not present

## 2018-11-01 DIAGNOSIS — Z0000 Encounter for general adult medical examination without abnormal findings: Secondary | ICD-10-CM | POA: Diagnosis not present

## 2018-11-01 DIAGNOSIS — Z1211 Encounter for screening for malignant neoplasm of colon: Secondary | ICD-10-CM | POA: Diagnosis not present

## 2018-11-09 DIAGNOSIS — J019 Acute sinusitis, unspecified: Secondary | ICD-10-CM | POA: Diagnosis not present

## 2018-11-09 DIAGNOSIS — B9689 Other specified bacterial agents as the cause of diseases classified elsewhere: Secondary | ICD-10-CM | POA: Diagnosis not present

## 2018-11-09 DIAGNOSIS — R6889 Other general symptoms and signs: Secondary | ICD-10-CM | POA: Diagnosis not present

## 2018-11-15 ENCOUNTER — Other Ambulatory Visit: Payer: Medicare Other

## 2018-11-22 DIAGNOSIS — G894 Chronic pain syndrome: Secondary | ICD-10-CM | POA: Diagnosis not present

## 2018-11-22 DIAGNOSIS — Z79899 Other long term (current) drug therapy: Secondary | ICD-10-CM | POA: Diagnosis not present

## 2018-11-22 DIAGNOSIS — M961 Postlaminectomy syndrome, not elsewhere classified: Secondary | ICD-10-CM | POA: Diagnosis not present

## 2018-11-22 DIAGNOSIS — Z79891 Long term (current) use of opiate analgesic: Secondary | ICD-10-CM | POA: Diagnosis not present

## 2018-12-17 DIAGNOSIS — E119 Type 2 diabetes mellitus without complications: Secondary | ICD-10-CM | POA: Diagnosis not present

## 2018-12-24 ENCOUNTER — Other Ambulatory Visit: Payer: Medicare Other

## 2018-12-24 DIAGNOSIS — E119 Type 2 diabetes mellitus without complications: Secondary | ICD-10-CM | POA: Diagnosis not present

## 2018-12-24 DIAGNOSIS — R8290 Unspecified abnormal findings in urine: Secondary | ICD-10-CM | POA: Diagnosis not present

## 2018-12-26 DIAGNOSIS — E119 Type 2 diabetes mellitus without complications: Secondary | ICD-10-CM | POA: Diagnosis not present

## 2018-12-27 DIAGNOSIS — M961 Postlaminectomy syndrome, not elsewhere classified: Secondary | ICD-10-CM | POA: Diagnosis not present

## 2018-12-27 DIAGNOSIS — Z79891 Long term (current) use of opiate analgesic: Secondary | ICD-10-CM | POA: Diagnosis not present

## 2018-12-27 DIAGNOSIS — R2242 Localized swelling, mass and lump, left lower limb: Secondary | ICD-10-CM | POA: Diagnosis not present

## 2018-12-27 DIAGNOSIS — Z79899 Other long term (current) drug therapy: Secondary | ICD-10-CM | POA: Diagnosis not present

## 2018-12-27 DIAGNOSIS — G894 Chronic pain syndrome: Secondary | ICD-10-CM | POA: Diagnosis not present

## 2019-01-09 DIAGNOSIS — S91331A Puncture wound without foreign body, right foot, initial encounter: Secondary | ICD-10-CM | POA: Diagnosis not present

## 2019-01-09 DIAGNOSIS — Z23 Encounter for immunization: Secondary | ICD-10-CM | POA: Diagnosis not present

## 2019-01-14 DIAGNOSIS — E119 Type 2 diabetes mellitus without complications: Secondary | ICD-10-CM | POA: Diagnosis not present

## 2019-01-14 DIAGNOSIS — G8929 Other chronic pain: Secondary | ICD-10-CM | POA: Diagnosis not present

## 2019-01-23 DIAGNOSIS — E119 Type 2 diabetes mellitus without complications: Secondary | ICD-10-CM | POA: Diagnosis not present

## 2019-01-30 DIAGNOSIS — R2242 Localized swelling, mass and lump, left lower limb: Secondary | ICD-10-CM | POA: Diagnosis not present

## 2019-01-30 DIAGNOSIS — E119 Type 2 diabetes mellitus without complications: Secondary | ICD-10-CM | POA: Diagnosis not present

## 2019-01-31 DIAGNOSIS — R2242 Localized swelling, mass and lump, left lower limb: Secondary | ICD-10-CM | POA: Diagnosis not present

## 2019-02-07 DIAGNOSIS — Z79891 Long term (current) use of opiate analgesic: Secondary | ICD-10-CM | POA: Diagnosis not present

## 2019-02-07 DIAGNOSIS — G894 Chronic pain syndrome: Secondary | ICD-10-CM | POA: Diagnosis not present

## 2019-02-07 DIAGNOSIS — M961 Postlaminectomy syndrome, not elsewhere classified: Secondary | ICD-10-CM | POA: Diagnosis not present

## 2019-02-07 DIAGNOSIS — Z79899 Other long term (current) drug therapy: Secondary | ICD-10-CM | POA: Diagnosis not present

## 2019-02-27 DIAGNOSIS — E119 Type 2 diabetes mellitus without complications: Secondary | ICD-10-CM | POA: Diagnosis not present

## 2019-03-04 ENCOUNTER — Ambulatory Visit (INDEPENDENT_AMBULATORY_CARE_PROVIDER_SITE_OTHER): Payer: Medicare Other | Admitting: Vascular Surgery

## 2019-03-04 ENCOUNTER — Encounter (INDEPENDENT_AMBULATORY_CARE_PROVIDER_SITE_OTHER): Payer: Medicare Other

## 2019-03-04 ENCOUNTER — Other Ambulatory Visit: Payer: Self-pay

## 2019-03-04 ENCOUNTER — Encounter (INDEPENDENT_AMBULATORY_CARE_PROVIDER_SITE_OTHER): Payer: Self-pay | Admitting: Vascular Surgery

## 2019-03-04 VITALS — BP 107/68 | HR 76 | Resp 10 | Ht 68.0 in | Wt 138.0 lb

## 2019-03-04 DIAGNOSIS — I10 Essential (primary) hypertension: Secondary | ICD-10-CM | POA: Diagnosis not present

## 2019-03-04 DIAGNOSIS — M47816 Spondylosis without myelopathy or radiculopathy, lumbar region: Secondary | ICD-10-CM

## 2019-03-04 DIAGNOSIS — I872 Venous insufficiency (chronic) (peripheral): Secondary | ICD-10-CM | POA: Diagnosis not present

## 2019-03-04 DIAGNOSIS — I739 Peripheral vascular disease, unspecified: Secondary | ICD-10-CM

## 2019-03-04 DIAGNOSIS — F17200 Nicotine dependence, unspecified, uncomplicated: Secondary | ICD-10-CM

## 2019-03-04 DIAGNOSIS — Z79899 Other long term (current) drug therapy: Secondary | ICD-10-CM

## 2019-03-04 DIAGNOSIS — Z791 Long term (current) use of non-steroidal anti-inflammatories (NSAID): Secondary | ICD-10-CM

## 2019-03-07 ENCOUNTER — Other Ambulatory Visit: Payer: Self-pay

## 2019-03-07 DIAGNOSIS — Z79899 Other long term (current) drug therapy: Secondary | ICD-10-CM | POA: Diagnosis not present

## 2019-03-07 DIAGNOSIS — Z79891 Long term (current) use of opiate analgesic: Secondary | ICD-10-CM | POA: Diagnosis not present

## 2019-03-07 DIAGNOSIS — M961 Postlaminectomy syndrome, not elsewhere classified: Secondary | ICD-10-CM | POA: Diagnosis not present

## 2019-03-07 DIAGNOSIS — G894 Chronic pain syndrome: Secondary | ICD-10-CM | POA: Diagnosis not present

## 2019-03-13 ENCOUNTER — Other Ambulatory Visit: Payer: Self-pay

## 2019-03-13 ENCOUNTER — Encounter (INDEPENDENT_AMBULATORY_CARE_PROVIDER_SITE_OTHER): Payer: Self-pay | Admitting: Nurse Practitioner

## 2019-03-13 ENCOUNTER — Ambulatory Visit (INDEPENDENT_AMBULATORY_CARE_PROVIDER_SITE_OTHER): Payer: Medicare Other

## 2019-03-13 ENCOUNTER — Other Ambulatory Visit (INDEPENDENT_AMBULATORY_CARE_PROVIDER_SITE_OTHER): Payer: Self-pay | Admitting: Vascular Surgery

## 2019-03-13 ENCOUNTER — Ambulatory Visit (INDEPENDENT_AMBULATORY_CARE_PROVIDER_SITE_OTHER): Payer: Medicare Other | Admitting: Nurse Practitioner

## 2019-03-13 VITALS — BP 127/70 | HR 77 | Resp 16 | Ht 68.0 in | Wt 137.4 lb

## 2019-03-13 DIAGNOSIS — M7989 Other specified soft tissue disorders: Secondary | ICD-10-CM

## 2019-03-13 DIAGNOSIS — M79606 Pain in leg, unspecified: Secondary | ICD-10-CM

## 2019-03-13 DIAGNOSIS — M0579 Rheumatoid arthritis with rheumatoid factor of multiple sites without organ or systems involvement: Secondary | ICD-10-CM

## 2019-03-13 DIAGNOSIS — G8929 Other chronic pain: Secondary | ICD-10-CM

## 2019-03-13 DIAGNOSIS — R0989 Other specified symptoms and signs involving the circulatory and respiratory systems: Secondary | ICD-10-CM

## 2019-03-13 DIAGNOSIS — M79605 Pain in left leg: Secondary | ICD-10-CM | POA: Diagnosis not present

## 2019-03-13 DIAGNOSIS — F17210 Nicotine dependence, cigarettes, uncomplicated: Secondary | ICD-10-CM

## 2019-03-13 DIAGNOSIS — Z79899 Other long term (current) drug therapy: Secondary | ICD-10-CM

## 2019-03-13 DIAGNOSIS — I872 Venous insufficiency (chronic) (peripheral): Secondary | ICD-10-CM

## 2019-03-13 DIAGNOSIS — M79604 Pain in right leg: Secondary | ICD-10-CM

## 2019-03-13 DIAGNOSIS — E119 Type 2 diabetes mellitus without complications: Secondary | ICD-10-CM | POA: Diagnosis not present

## 2019-03-13 DIAGNOSIS — Z1389 Encounter for screening for other disorder: Secondary | ICD-10-CM | POA: Diagnosis not present

## 2019-03-16 ENCOUNTER — Encounter (INDEPENDENT_AMBULATORY_CARE_PROVIDER_SITE_OTHER): Payer: Self-pay | Admitting: Nurse Practitioner

## 2019-03-16 DIAGNOSIS — I872 Venous insufficiency (chronic) (peripheral): Secondary | ICD-10-CM | POA: Insufficient documentation

## 2019-03-16 NOTE — Progress Notes (Signed)
SUBJECTIVE:  Patient ID: Karen Chen, female    DOB: Apr 27, 1961, 58 y.o.   MRN: [id number] Chief Complaint  Patient presents with  . Follow-up    ultrasound follow up    HPI  Karen Chen is a 58 y.o. female that presents today for pain and swelling in her lower extremities.  The patient endorses that the swelling is mainly in her left foot.  Although recently her foot swelling has decreased with elevation and use of compression socks.  The patient has an extensive history of rheumatoid arthritis.  She denies fever, chills, nausea and vomiting.    The patient has evidence of reflux in her bilateral CFV and FV. The right has superficial reflux in the GSV and the left has reflux in the popliteal and SFJ.  There is no evidence of DVT or superficial venous thrombosis.    She has ABIs of 1.08 on her right and 0.95 on her left.  She has triphasic waveforms bilaterally with strong toe waveforms bilaterally.    Past Medical History:  Diagnosis Date  . Anxiety   . Chronic lower back pain   . Hypertension   . Restless leg   . Rheumatoid arthritis [facility])     Past Surgical History:  Procedure Laterality Date  . BACK SURGERY  1980    Social History   Socioeconomic History  . Marital status: Married    Spouse name: Not on file  . Number of children: Not on file  . Years of education: Not on file  . Highest education level: Not on file  Occupational History  . Not on file  Social Needs  . Financial resource strain: Not on file  . Food insecurity    Worry: Not on file    Inability: Not on file  . Transportation needs    Medical: Not on file    Non-medical: Not on file  Tobacco Use  . Smoking status: Current Every Day Smoker    Packs/day: 1.00    Years: 35.00    Pack years: 35.00  . Smokeless tobacco: Never Used  Substance and Sexual Activity  . Alcohol use: No  . Drug use: No  . Sexual activity: Never  Lifestyle  . Physical activity    Days per week: Not on  file    Minutes per session: Not on file  . Stress: Not on file  Relationships  . Social Musician on phone: Not on file    Gets together: Not on file    Attends religious service: Not on file    Active member of club or organization: Not on file    Attends meetings of clubs or organizations: Not on file    Relationship status: Not on file  . Intimate partner violence    Fear of current or ex partner: Not on file    Emotionally abused: Not on file    Physically abused: Not on file    Forced sexual activity: Not on file  Other Topics Concern  . Not on file  Social History Narrative  . Not on file    Family History  Problem Relation Age of Onset  . Heart failure Father   . Hypertension Father   . Diabetes Sister     Allergies  Allergen Reactions  . Haldol [Haloperidol Lactate]   . Orencia [Abatacept] Anaphylaxis  . Sulfa Antibiotics Anaphylaxis  . Azathioprine Other (See Comments)  . Indocin [Indomethacin]   . Levaquin [  Levofloxacin In D5w]   . Methotrexate Derivatives   . Prozac [Fluoxetine Hcl]   . Remicade [Infliximab] Hives  . Robaxin [Methocarbamol]   . Tizanidine   . Zanaflex [Tizanidine Hcl] Other (See Comments)    Patient states a burning sensation in her mouth 30 minutes after taking.  . Adalimumab Itching and Rash     Review of Systems   Review of Systems: Negative Unless Checked Constitutional: [] Weight loss  [] Fever  [] Chills Cardiac: [] Chest pain   []  Atrial Fibrillation  [] Palpitations   [] Shortness of breath when laying flat   [] Shortness of breath with exertion. [] Shortness of breath at rest Vascular:  [] Pain in legs with walking   [] Pain in legs with standing [] Pain in legs when laying flat   [] Claudication    [] Pain in feet when laying flat    [] History of DVT   [] Phlebitis   [x] Swelling in legs   [] Varicose veins   [] Non-healing ulcers Pulmonary:   [] Uses home oxygen   [] Productive cough   [] Hemoptysis   [] Wheeze  [] COPD   [x] Asthma  Neurologic:  [] Dizziness   [] Seizures  [] Blackouts [] History of stroke   [] History of TIA  [] Aphasia   [] Temporary Blindness   [] Weakness or numbness in arm   [] Weakness or numbness in leg Musculoskeletal:   [x] Joint swelling   [] Joint pain   [] Low back pain  []  History of Knee Replacement [x] Arthritis [] back Surgeries  []  Spinal Stenosis    Hematologic:  [] Easy bruising  [] Easy bleeding   [] Hypercoagulable state   [] Anemic Gastrointestinal:  [] Diarrhea   [] Vomiting  [] Gastroesophageal reflux/heartburn   [] Difficulty swallowing. [] Abdominal pain Genitourinary:  [] Chronic kidney disease   [] Difficult urination  [] Anuric   [] Blood in urine [] Frequent urination  [] Burning with urination   [] Hematuria Skin:  [] Rashes   [] Ulcers [] Wounds Psychological:  [] History of anxiety   [x]  History of major depression  []  Memory Difficulties      OBJECTIVE:   Physical Exam  BP 127/70 (BP Location: Right Arm)   Pulse 77   Resp 16   Ht 5\' 8"  (1.727 m)   Wt 137 lb 6.4 oz (62.3 kg)   LMP  (LMP Unknown)   BMI 20.89 kg/m   Gen: WD/WN, NAD Head: [place]/AT, No temporalis wasting.  Ear/Nose/Throat: Hearing grossly intact, nares w/o erythema or drainage Eyes: PER, EOMI, sclera nonicteric.  Neck: Supple, no masses.  No JVD.  Pulmonary:  Good air movement, no use of accessory muscles.  Cardiac: RRR Vascular: scattered varicosities present bilaterally.  Mild venous stasis changes to the legs bilaterally.   Vessel Right Left  Radial Palpable Palpable  Dorsalis Pedis Palpable Palpable  Posterior Tibial Palpable Palpable   Gastrointestinal: soft, non-distended. No guarding/no peritoneal signs.  Musculoskeletal: M/S 5/5 throughout.  No deformity or atrophy. Hands show extensive RA changes Neurologic: Pain and light touch intact in extremities.  Symmetrical.  Speech is fluent. Motor exam as listed above. Psychiatric: Judgment intact, Mood & affect appropriate for pt's clinical situation. Dermatologic: No Venous  rashes. No Ulcers Noted.  No changes consistent with cellulitis. Lymph : No Cervical lymphadenopathy, no lichenification or skin changes of chronic lymphedema.       ASSESSMENT AND PLAN:  1. Chronic venous insufficiency Recommend:  The patient is complaining of varicose veins.    I have had a long discussion with the patient regarding  varicose veins and why they cause symptoms.  Patient will begin wearing graduated compression stockings on a daily basis, beginning first  thing in the morning and removing them in the evening. The patient is instructed specifically not to sleep in the stockings.    The patient  will also begin using over-the-counter analgesics such as Motrin 600 mg po TID to help control the symptoms as needed.    In addition, behavioral modification including elevation during the day will be initiated, utilizing a recliner was recommended.  The patient is also instructed to continue exercising such as walking 4-5 times per week.  At this time the patient wishes to continue conservative therapy and is not interested in more invasive treatments such as laser ablation and sclerotherapy.  The Patient will follow up PRN if the symptoms worsen.  2. Rheumatoid arthritis involving multiple sites with positive rheumatoid factor ([place]) Patient's main complaint was swelling within her left foot.  After utilizing compression it is nearly resolved.  The RA may also play a component with the swelling.    3. Nicotine dependence, cigarettes, uncomplicated Smoking cessation was discussed, 3-10 minutes spent on this topic specifically   4. Chronic lower extremity pain (Secondary source of pain) (Bilateral) (L>R) Recommend:  I do not find evidence of Vascular pathology that would explain the patient's symptoms  The patient has atypical pain symptoms for vascular disease  Noninvasive studies including venous ultrasound of the legs do not identify vascular problems  The patient should  continue walking and begin a more formal exercise program. The patient should continue his antiplatelet therapy and aggressive treatment of the lipid abnormalities. The patient should begin wearing graduated compression socks 15-20 mmHg strength to control her mild edema.  Further work-up of her lower extremity pain is deferred to the primary service      Current Outpatient Medications on File Prior to Visit  Medication Sig Dispense Refill  . aspirin 81 MG tablet Take 81 mg by mouth daily.    . Buprenorphine HCl-Naloxone HCl 2-0.5 MG FILM     . escitalopram (LEXAPRO) 20 MG tablet 2 tabs po daily 30 tablet 0  . gabapentin (NEURONTIN) 300 MG capsule Take 2 capsules (600 mg total) by mouth 2 (two) times daily.    . hydrochlorothiazide (HYDRODIURIL) 25 MG tablet Take 1 tablet (25 mg total) by mouth daily. 30 tablet 0  . oxyCODONE-acetaminophen (PERCOCET/ROXICET) 5-325 MG tablet Take 1 tablet by mouth every 8 (eight) hours as needed.     . potassium chloride (K-DUR) 10 MEQ tablet Take 2 tablets (20 mEq total) by mouth daily. Take two tablets. 180 tablet 1  . predniSONE (DELTASONE) 5 MG tablet Take 5 mg by mouth 2 (two) times daily with a meal.    . PROAIR HFA 108 (90 Base) MCG/ACT inhaler Inhale 90 mcg into the lungs 4 (four) times daily as needed. 1-2 puffs.    Marland Kitchen rOPINIRole (REQUIP) 1 MG tablet Take 1 tab po tid for restless leg syndrome 90 tablet 0  . amoxicillin-clavulanate (AUGMENTIN) 875-125 MG tablet Take 1 tablet by mouth every 12 (twelve) hours. (Patient not taking: Reported on 09/08/2018) 12 tablet 0  . Calcium Carb-Cholecalciferol (CALCIUM PLUS D3 ABSORBABLE) [phone] MG-UNIT CAPS Take 1 capsule by mouth daily with breakfast. (Patient not taking: Reported on 03/04/2019) 90 capsule 0  . Multiple Vitamin (MULTIVITAMIN WITH MINERALS) TABS tablet Take 1 tablet by mouth daily. (Patient not taking: Reported on 03/04/2019) 30 tablet 0  . ondansetron (ZOFRAN ODT) 4 MG disintegrating tablet Take 1  tablet (4 mg total) by mouth every 8 (eight) hours as needed for nausea or vomiting. (  Patient not taking: Reported on 03/04/2019) 20 tablet 0   No current facility-administered medications on file prior to visit.     There are no Patient Instructions on file for this visit. No follow-ups on file.   Georgiana SpinnerFallon E Brown, NP  This note was completed with Office managerDragon Dictation.  Any errors are purely unintentional.

## 2019-03-17 ENCOUNTER — Encounter (INDEPENDENT_AMBULATORY_CARE_PROVIDER_SITE_OTHER): Payer: Self-pay | Admitting: Vascular Surgery

## 2019-03-17 NOTE — Progress Notes (Signed)
MRN : [id number]  Karen Chen is a 58 y.o. ([birth year]) female who presents with chief complaint of  Chief Complaint  Patient presents with   Follow-up  .  History of Present Illness:   Patient is seen for evaluation of leg swelling. The patient first noticed the swelling remotely but is now concerned because of a significant increase in the overall edema. The swelling is associated with pain and discoloration. The patient notes that in the morning the legs are significantly improved but they steadily worsened throughout the course of the day. Elevation makes the legs better, dependency makes them much worse.   There is no history of ulcerations associated with the swelling.   The patient denies any recent changes in their medications.  The patient has not been wearing graduated compression.  The patient has no had any past angiography, interventions or vascular surgery.  The patient denies a history of DVT or PE. There is no prior history of phlebitis. There is no history of primary lymphedema.  There is no history of radiation treatment to the groin or pelvis No history of malignancies. No history of trauma or groin or pelvic surgery. No history of foreign travel or parasitic infections area    Current Meds  Medication Sig   aspirin 81 MG tablet Take 81 mg by mouth daily.   Buprenorphine HCl-Naloxone HCl 2-0.5 MG FILM    escitalopram (LEXAPRO) 20 MG tablet 2 tabs po daily   gabapentin (NEURONTIN) 300 MG capsule Take 2 capsules (600 mg total) by mouth 2 (two) times daily.   hydrochlorothiazide (HYDRODIURIL) 25 MG tablet Take 1 tablet (25 mg total) by mouth daily.   potassium chloride (K-DUR) 10 MEQ tablet Take 2 tablets (20 mEq total) by mouth daily. Take two tablets.   predniSONE (DELTASONE) 5 MG tablet Take 5 mg by mouth 2 (two) times daily with a meal.   rOPINIRole (REQUIP) 1 MG tablet Take 1 tab po tid for restless leg syndrome    Past Medical History:   Diagnosis Date   Anxiety    Chronic lower back pain    Hypertension    Restless leg    Rheumatoid arthritis (HCC)     Past Surgical History:  Procedure Laterality Date   BACK SURGERY  1980    Social History Social History   Tobacco Use   Smoking status: Current Every Day Smoker    Packs/day: 1.00    Years: 35.00    Pack years: 35.00   Smokeless tobacco: Never Used  Substance Use Topics   Alcohol use: No   Drug use: No    Family History Family History  Problem Relation Age of Onset   Heart failure Father    Hypertension Father    Diabetes Sister   No family history of bleeding/clotting disorders, porphyria or autoimmune disease   Allergies  Allergen Reactions   Haldol [Haloperidol Lactate]    Orencia [Abatacept] Anaphylaxis   Sulfa Antibiotics Anaphylaxis   Azathioprine Other (See Comments)   Indocin [Indomethacin]    Levaquin [Levofloxacin In D5w]    Methotrexate Derivatives    Prozac [Fluoxetine Hcl]    Remicade [Infliximab] Hives   Robaxin [Methocarbamol]    Tizanidine    Zanaflex [Tizanidine Hcl] Other (See Comments)    Patient states a burning sensation in her mouth 30 minutes after taking.   Adalimumab Itching and Rash     REVIEW OF SYSTEMS (Negative unless checked)  Constitutional: Weight loss  Fever    Chills Cardiac: [] Chest pain   [] Chest pressure   [] Palpitations   [] Shortness of breath when laying flat   [] Shortness of breath with exertion. Vascular:  [] Pain in legs with walking   [x] Pain in legs at rest  [] History of DVT   [] Phlebitis   [x] Swelling in legs   [] Varicose veins   [] Non-healing ulcers Pulmonary:   [] Uses home oxygen   [] Productive cough   [] Hemoptysis   [] Wheeze  [] COPD   [] Asthma Neurologic:  [] Dizziness   [] Seizures   [] History of stroke   [] History of TIA  [] Aphasia   [] Vissual changes   [] Weakness or numbness in arm   [x] Weakness or numbness in leg Musculoskeletal:   [] Joint swelling   [] Joint  pain   [x] Low back pain Hematologic:  [] Easy bruising  [] Easy bleeding   [] Hypercoagulable state   [] Anemic Gastrointestinal:  [] Diarrhea   [] Vomiting  [] Gastroesophageal reflux/heartburn   [] Difficulty swallowing. Genitourinary:  [] Chronic kidney disease   [] Difficult urination  [] Frequent urination   [] Blood in urine Skin:  [] Rashes   [] Ulcers  Psychological:  [] History of anxiety   []  History of major depression.  Physical Examination  Vitals:   03/04/19 1553  BP: 107/68  Pulse: 76  Resp: 10  Weight: 138 lb (62.6 kg)  Height: 5\' 8"  (1.727 m)   Body mass index is 20.98 kg/m. Gen: WD/WN, NAD Head: [place]/AT, No temporalis wasting.  Ear/Nose/Throat: Hearing grossly intact, nares w/o erythema or drainage, poor dentition Eyes: PER, EOMI, sclera nonicteric.  Neck: Supple, no masses.  No bruit or JVD.  Pulmonary:  Good air movement, clear to auscultation bilaterally, no use of accessory muscles.  Cardiac: RRR, normal S1, S2, no Murmurs. Vascular: scattered varicosities present bilaterally.  Mild venous stasis changes to the legs bilaterally.  2+ soft pitting edema Vessel Right Left  Radial Palpable Palpable  PT Palpable Palpable  DP Palpable Palpable  Gastrointestinal: soft, non-distended. No guarding/no peritoneal signs.  Musculoskeletal: M/S 5/5 throughout.  No deformity or atrophy.  Neurologic: CN 2-12 intact. Pain and light touch intact in extremities.  Symmetrical.  Speech is fluent. Motor exam as listed above. Psychiatric: Judgment intact, Mood & affect appropriate for pt's clinical situation. Dermatologic: Venous rashes no ulcers noted.  No changes consistent with cellulitis. Lymph : No Cervical lymphadenopathy, no lichenification or skin changes of chronic lymphedema.  CBC Lab Results  Component Value Date   WBC 7.7 09/08/2018   HGB 12.9 09/08/2018   HCT 39.7 09/08/2018   MCV 92.1 09/08/2018   PLT 354 09/08/2018    BMET    Component Value Date/Time   NA 137  09/08/2018 0138   NA 144 03/07/2017 1321   NA 140 06/15/2014 0902   K 2.7 (LL) 09/08/2018 0138   K 3.5 06/15/2014 0902   CL 98 09/08/2018 0138   CL 105 06/15/2014 0902   CO2 28 09/08/2018 0138   CO2 28 06/15/2014 0902   GLUCOSE 176 (H) 09/08/2018 0138   GLUCOSE 98 06/15/2014 0902   BUN 17 09/08/2018 0138   BUN 15 03/07/2017 1321   BUN 13 06/15/2014 0902   CREATININE 0.72 09/08/2018 0138   CREATININE 0.67 06/15/2014 0902   CALCIUM 8.9 09/08/2018 0138   CALCIUM 7.9 (L) 06/15/2014 0902   GFRNONAA >60 09/08/2018 0138   GFRNONAA >60 06/15/2014 0902   GFRNONAA >60 10/12/2012 1409   GFRAA >60 09/08/2018 0138   GFRAA >60 06/15/2014 0902   GFRAA >60 10/12/2012 1409   CrCl cannot be calculated (Patient's  most recent lab result is older than the maximum 21 days allowed.).  COAG Lab Results  Component Value Date   INR 0.88 12/21/2017    Radiology Vas [place] Burnard Bunting With/wo Tbi  Result Date: 03/14/2019 LOWER EXTREMITY DOPPLER STUDY Indications: Leg pain; decreased pulses.  Performing Technologist: Charlane Ferretti RT (R)(VS)  Examination Guidelines: A complete evaluation includes at minimum, Doppler waveform signals and systolic blood pressure reading at the level of bilateral brachial, anterior tibial, and posterior tibial arteries, when vessel segments are accessible. Bilateral testing is considered an integral part of a complete examination. Photoelectric Plethysmograph (PPG) waveforms and toe systolic pressure readings are included as required and additional duplex testing as needed. Limited examinations for reoccurring indications may be performed as noted.  ABI Findings: +---------+------------------+-----+---------+--------+  Right     Rt Pressure (mmHg) Index Waveform  Comment   +---------+------------------+-----+---------+--------+  Brachial  132                                          +---------+------------------+-----+---------+--------+  ATA       129                0.98  triphasic            +---------+------------------+-----+---------+--------+  PTA       142                1.08  triphasic           +---------+------------------+-----+---------+--------+  Great Toe 151                1.14  Normal              +---------+------------------+-----+---------+--------+ +---------+------------------+-----+---------+-------+  Left      Lt Pressure (mmHg) Index Waveform  Comment  +---------+------------------+-----+---------+-------+  Brachial  130                                         +---------+------------------+-----+---------+-------+  ATA       125                0.95  triphasic          +---------+------------------+-----+---------+-------+  PTA       117                0.89  triphasic          +---------+------------------+-----+---------+-------+  Great Toe 123                0.93  Normal             +---------+------------------+-----+---------+-------+  Summary: Right: Resting right ankle-brachial index is within normal range. No evidence of significant right lower extremity arterial disease. The right toe-brachial index is normal. Left: Resting left ankle-brachial index is within normal range. No evidence of significant left lower extremity arterial disease. The left toe-brachial index is normal.  *See table(s) above for measurements and observations.  Electronically signed by Hortencia Pilar MD on 03/14/2019 at 4:54:17 PM.   Final    Vas [place] Lower Extremity Venous Reflux  Result Date: 03/14/2019  Lower Venous Reflux Study Indications: Pain, and Swelling.  Performing Technologist: Charlane Ferretti RT (R)(VS)  Examination Guidelines: A complete evaluation includes B-mode imaging, spectral Doppler, color Doppler, and power Doppler as needed of all  accessible portions of each vessel. Bilateral testing is considered an integral part of a complete examination. Limited examinations for reoccurring indications may be performed as noted. The reflux portion of the exam is performed with the patient  in reverse Trendelenburg.  +---------+---------------+---------+-----------+----------+-------+  RIGHT     Compressibility Phasicity Spontaneity Properties Summary  +---------+---------------+---------+-----------+----------+-------+  CFV       Full                                                      +---------+---------------+---------+-----------+----------+-------+  SFJ       Full                                                      +---------+---------------+---------+-----------+----------+-------+  FV Prox   Full                                                      +---------+---------------+---------+-----------+----------+-------+  FV Mid    Full                                                      +---------+---------------+---------+-----------+----------+-------+  FV Distal Full                                                      +---------+---------------+---------+-----------+----------+-------+  POP       Full                                                      +---------+---------------+---------+-----------+----------+-------+  GSV       Full                                                      +---------+---------------+---------+-----------+----------+-------+  SSV       Full                                                      +---------+---------------+---------+-----------+----------+-------+  +---------+---------------+---------+-----------+----------+-------+  LEFT      Compressibility Phasicity Spontaneity Properties Summary  +---------+---------------+---------+-----------+----------+-------+  CFV       Full                                                      +---------+---------------+---------+-----------+----------+-------+  SFJ       Full                                                      +---------+---------------+---------+-----------+----------+-------+  FV Prox   Full                                                       +---------+---------------+---------+-----------+----------+-------+  FV Mid    Full                                                      +---------+---------------+---------+-----------+----------+-------+  FV Distal Full                                                      +---------+---------------+---------+-----------+----------+-------+  POP       Full                                                      +---------+---------------+---------+-----------+----------+-------+  GSV       Full                                                      +---------+---------------+---------+-----------+----------+-------+  SSV       Full                                                      +---------+---------------+---------+-----------+----------+-------+   +------------------------------+----------+  VEIN DIAMETERS:                Right (cm)  +------------------------------+----------+  GSV at Saphenofemoral junction .56         +------------------------------+----------+  GSV at prox thigh              .24         +------------------------------+----------+  GSV at mid thigh               .20         +------------------------------+----------+  GSV at distal thigh            .27         +------------------------------+----------+  GSV at knee                    .22         +------------------------------+----------+  GSV prox calf                  .  16         +------------------------------+----------+  SSV prox                       .25         +------------------------------+----------+  Right Reflux Technical Findings: Reflux greater than 500ms in the CFV, FV and mid GSV. Left Reflux Technical Findings: Reflux greater than 500ms in the CFV, FV, popliteal vein and SFJ.  Summary: Right: There is no evidence of deep vein thrombosis in the lower extremity.There is no evidence of superficial venous thrombosis. Left: There is no evidence of deep vein thrombosis in the lower extremity.There is no evidence of superficial  venous thrombosis.  *See table(s) above for measurements and observations. Electronically signed by Levora DredgeGregory Taft Worthing MD on 03/14/2019 at 4:54:19 PM.    Final      Assessment/Plan 1. Chronic venous insufficiency No surgery or intervention at this point in time.    I have had a long discussion with the patient regarding venous insufficiency and why it  causes symptoms. I have discussed with the patient the chronic skin changes that accompany venous insufficiency and the long term sequela such as infection and ulceration.  Patient will begin wearing graduated compression stockings class 1 (20-30 mmHg) or compression wraps on a daily basis a prescription was given. The patient will put the stockings on first thing in the morning and removing them in the evening. The patient is instructed specifically not to sleep in the stockings.    In addition, behavioral modification including several periods of elevation of the lower extremities during the day will be continued. I have demonstrated that proper elevation is a position with the ankles at heart level.  The patient is instructed to begin routine exercise, especially walking on a daily basis  Patient should undergo duplex ultrasound of the venous system to ensure that DVT or reflux is not present.  Following the review of the ultrasound the patient will follow up in 2-3 months to reassess the degree of swelling and the control that graduated compression stockings or compression wraps  is offering.   The patient can be assessed for a Lymph Pump at that time - VAS US LOWER EXTREMITY VENOUS REFLUX; Future  2. Peripheral vascular disease (HCC)  Recommend:  The patient has evidence of atherosclerosis of the lower extremities with claudication.  The patient does not voice lifestyle limiting changes at this point in time.  Noninvasive studies do not suggest clinically significant change.  No invasive studies, angiography or surgery at this time The  patient should continue walking and begin a more formal exercise program.  The patient should continue antiplatelet therapy and aggressive treatment of the lipid abnormalities  No changes in the patient's medications at this time  The patient should continue wearing graduated compression socks 10-15 mmHg strength to control the mild edema.   - VAS US ABI WITH/WO TBI; Future  3. Essential hypertension Continue antihypertensive medications as already ordered, these medications have been reviewed and there are no changes at this time.   4. Lumbar facet hypertrophy Continue NSAID medications as already ordered, these medications have been reviewed and there are no changes at this time.  Continued activity and therapy was stressed.    Levora DredgeGregory Merline Perkin, MD  03/17/2019 5:00 PM

## 2019-04-03 DIAGNOSIS — E119 Type 2 diabetes mellitus without complications: Secondary | ICD-10-CM | POA: Diagnosis not present

## 2019-04-03 DIAGNOSIS — M810 Age-related osteoporosis without current pathological fracture: Secondary | ICD-10-CM | POA: Diagnosis not present

## 2019-04-04 DIAGNOSIS — Z79899 Other long term (current) drug therapy: Secondary | ICD-10-CM | POA: Diagnosis not present

## 2019-04-04 DIAGNOSIS — Z79891 Long term (current) use of opiate analgesic: Secondary | ICD-10-CM | POA: Diagnosis not present

## 2019-04-04 DIAGNOSIS — M961 Postlaminectomy syndrome, not elsewhere classified: Secondary | ICD-10-CM | POA: Diagnosis not present

## 2019-04-04 DIAGNOSIS — G894 Chronic pain syndrome: Secondary | ICD-10-CM | POA: Diagnosis not present

## 2019-05-08 DIAGNOSIS — K5900 Constipation, unspecified: Secondary | ICD-10-CM | POA: Diagnosis not present

## 2019-05-08 DIAGNOSIS — E119 Type 2 diabetes mellitus without complications: Secondary | ICD-10-CM | POA: Diagnosis not present

## 2019-05-16 DIAGNOSIS — M961 Postlaminectomy syndrome, not elsewhere classified: Secondary | ICD-10-CM | POA: Diagnosis not present

## 2019-05-16 DIAGNOSIS — E119 Type 2 diabetes mellitus without complications: Secondary | ICD-10-CM | POA: Diagnosis not present

## 2019-05-16 DIAGNOSIS — G894 Chronic pain syndrome: Secondary | ICD-10-CM | POA: Diagnosis not present

## 2019-05-16 DIAGNOSIS — Z79891 Long term (current) use of opiate analgesic: Secondary | ICD-10-CM | POA: Diagnosis not present

## 2019-05-16 DIAGNOSIS — Z79899 Other long term (current) drug therapy: Secondary | ICD-10-CM | POA: Diagnosis not present

## 2019-05-20 DIAGNOSIS — E119 Type 2 diabetes mellitus without complications: Secondary | ICD-10-CM | POA: Diagnosis not present

## 2019-06-12 DIAGNOSIS — E119 Type 2 diabetes mellitus without complications: Secondary | ICD-10-CM | POA: Diagnosis not present

## 2019-07-03 DIAGNOSIS — E119 Type 2 diabetes mellitus without complications: Secondary | ICD-10-CM | POA: Diagnosis not present

## 2019-07-04 DIAGNOSIS — Z79891 Long term (current) use of opiate analgesic: Secondary | ICD-10-CM | POA: Diagnosis not present

## 2019-07-04 DIAGNOSIS — M961 Postlaminectomy syndrome, not elsewhere classified: Secondary | ICD-10-CM | POA: Diagnosis not present

## 2019-07-04 DIAGNOSIS — G894 Chronic pain syndrome: Secondary | ICD-10-CM | POA: Diagnosis not present

## 2019-07-04 DIAGNOSIS — Z79899 Other long term (current) drug therapy: Secondary | ICD-10-CM | POA: Diagnosis not present

## 2019-07-09 DIAGNOSIS — E119 Type 2 diabetes mellitus without complications: Secondary | ICD-10-CM | POA: Diagnosis not present

## 2019-08-14 DIAGNOSIS — G8929 Other chronic pain: Secondary | ICD-10-CM | POA: Diagnosis not present

## 2019-08-14 DIAGNOSIS — E119 Type 2 diabetes mellitus without complications: Secondary | ICD-10-CM | POA: Diagnosis not present

## 2019-08-14 DIAGNOSIS — M069 Rheumatoid arthritis, unspecified: Secondary | ICD-10-CM | POA: Diagnosis not present

## 2019-10-10 ENCOUNTER — Other Ambulatory Visit: Payer: Self-pay

## 2019-10-10 NOTE — Patient Outreach (Signed)
[facility]) Care Management  10/10/2019  Byron Tipping [facility] 01/20/1961 [id number]  Medication Adherence call to Mrs. Normand Sloop HIPPA Compliant Voice message left with a call back number. Mrs. Barsamian is showing past due on Metformin 500 mg under [facility] Ins.   Lillia Abed CPhT Pharmacy Technician [facility] Care Management Direct Dial [phone]  Fax [phone] [email or web address]

## 2019-10-29 ENCOUNTER — Other Ambulatory Visit: Payer: Self-pay

## 2019-10-29 NOTE — Patient Outreach (Signed)
[facility]) Care Management  10/29/2019  Shirleyann Montero [facility] 01-19-1961 [id number]   Medication Adherence call to Mrs. [employer] Verify spoke with patient she is past due on Metformin 500 mg,patient explain she takes 1 tablet 2 times a day,patient also said she sometimes only takes 1 tablet daily.patient said she receives a pill pack every month from the pharmacy.and has received it for this month. Mrs. Homeyer is showing past due under [facility] Ins.  Lillia Abed CPhT Pharmacy Technician [facility] Management Direct Dial [phone]  Fax [phone] [email or web address]

## 2020-06-18 ENCOUNTER — Emergency Department: Payer: Medicare Other

## 2020-06-18 ENCOUNTER — Other Ambulatory Visit: Payer: Self-pay

## 2020-06-18 DIAGNOSIS — Z5321 Procedure and treatment not carried out due to patient leaving prior to being seen by health care provider: Secondary | ICD-10-CM | POA: Diagnosis not present

## 2020-06-18 DIAGNOSIS — W19XXXA Unspecified fall, initial encounter: Secondary | ICD-10-CM | POA: Diagnosis not present

## 2020-06-18 DIAGNOSIS — S41112A Laceration without foreign body of left upper arm, initial encounter: Secondary | ICD-10-CM | POA: Diagnosis not present

## 2020-06-18 DIAGNOSIS — G8929 Other chronic pain: Secondary | ICD-10-CM | POA: Diagnosis not present

## 2020-06-18 DIAGNOSIS — S4992XA Unspecified injury of left shoulder and upper arm, initial encounter: Secondary | ICD-10-CM | POA: Diagnosis present

## 2020-06-18 DIAGNOSIS — R200 Anesthesia of skin: Secondary | ICD-10-CM | POA: Diagnosis not present

## 2020-06-18 DIAGNOSIS — R0781 Pleurodynia: Secondary | ICD-10-CM | POA: Diagnosis not present

## 2020-06-18 NOTE — ED Triage Notes (Signed)
PT to ED s/p fall to left side. PT has chronic numbness to leg and tremors that causes impaired gait. PT has skin tear to left arm and Pain to left ribs.

## 2020-06-19 ENCOUNTER — Emergency Department
Admission: EM | Admit: 2020-06-19 | Discharge: 2020-06-19 | Disposition: A | Discharge status: Home / Self Care | Payer: Medicare Other | Attending: Emergency Medicine | Admitting: Emergency Medicine

## 2020-06-19 HISTORY — DX: Type 2 diabetes mellitus without complications: E11.9

## 2020-08-11 ENCOUNTER — Encounter: Payer: Self-pay | Admitting: Emergency Medicine

## 2020-08-11 ENCOUNTER — Emergency Department: Payer: Medicare Other

## 2020-08-11 ENCOUNTER — Other Ambulatory Visit: Payer: Self-pay

## 2020-08-11 DIAGNOSIS — I10 Essential (primary) hypertension: Secondary | ICD-10-CM | POA: Diagnosis not present

## 2020-08-11 DIAGNOSIS — Z7982 Long term (current) use of aspirin: Secondary | ICD-10-CM | POA: Diagnosis not present

## 2020-08-11 DIAGNOSIS — J40 Bronchitis, not specified as acute or chronic: Secondary | ICD-10-CM | POA: Diagnosis not present

## 2020-08-11 DIAGNOSIS — R062 Wheezing: Secondary | ICD-10-CM | POA: Diagnosis not present

## 2020-08-11 DIAGNOSIS — E119 Type 2 diabetes mellitus without complications: Secondary | ICD-10-CM | POA: Diagnosis not present

## 2020-08-11 DIAGNOSIS — Z20822 Contact with and (suspected) exposure to covid-19: Secondary | ICD-10-CM | POA: Insufficient documentation

## 2020-08-11 DIAGNOSIS — E876 Hypokalemia: Secondary | ICD-10-CM | POA: Diagnosis not present

## 2020-08-11 DIAGNOSIS — F17210 Nicotine dependence, cigarettes, uncomplicated: Secondary | ICD-10-CM | POA: Insufficient documentation

## 2020-08-11 DIAGNOSIS — Z8673 Personal history of transient ischemic attack (TIA), and cerebral infarction without residual deficits: Secondary | ICD-10-CM | POA: Insufficient documentation

## 2020-08-11 DIAGNOSIS — Z79899 Other long term (current) drug therapy: Secondary | ICD-10-CM | POA: Diagnosis not present

## 2020-08-11 DIAGNOSIS — M069 Rheumatoid arthritis, unspecified: Secondary | ICD-10-CM | POA: Diagnosis not present

## 2020-08-11 DIAGNOSIS — R0602 Shortness of breath: Secondary | ICD-10-CM | POA: Diagnosis present

## 2020-08-11 LAB — URINALYSIS, COMPLETE (UACMP) WITH MICROSCOPIC
Bilirubin Urine: NEGATIVE
Glucose, UA: NEGATIVE mg/dL
Hgb urine dipstick: NEGATIVE
Ketones, ur: NEGATIVE mg/dL
Nitrite: NEGATIVE
Protein, ur: NEGATIVE mg/dL
Specific Gravity, Urine: 1.008 (ref 1.005–1.030)
pH: 6 (ref 5.0–8.0)

## 2020-08-11 LAB — COMPREHENSIVE METABOLIC PANEL
ALT: 26 U/L (ref 0–44)
AST: 17 U/L (ref 15–41)
Albumin: 3.3 g/dL — ABNORMAL LOW (ref 3.5–5.0)
Alkaline Phosphatase: 97 U/L (ref 38–126)
Anion gap: 11 (ref 5–15)
BUN: 15 mg/dL (ref 6–20)
CO2: 25 mmol/L (ref 22–32)
Calcium: 8.8 mg/dL — ABNORMAL LOW (ref 8.9–10.3)
Chloride: 103 mmol/L (ref 98–111)
Creatinine, Ser: 0.77 mg/dL (ref 0.44–1.00)
GFR, Estimated: >60 mL/min (ref >60)
Glucose, Bld: 121 mg/dL — ABNORMAL HIGH (ref 70–99)
Potassium: 3.1 mmol/L — ABNORMAL LOW (ref 3.5–5.1)
Sodium: 139 mmol/L (ref 135–145)
Total Bilirubin: 0.5 mg/dL (ref 0.3–1.2)
Total Protein: 6.8 g/dL (ref 6.5–8.1)

## 2020-08-11 LAB — CBC
HCT: 41.6 % (ref 36.0–46.0)
Hemoglobin: 13.8 g/dL (ref 12.0–15.0)
MCH: 29.9 pg (ref 26.0–34.0)
MCHC: 33.2 g/dL (ref 30.0–36.0)
MCV: 90 fL (ref 80.0–100.0)
Platelets: 419 10*3/uL — ABNORMAL HIGH (ref 150–400)
RBC: 4.62 MIL/uL (ref 3.87–5.11)
RDW: 13.4 % (ref 11.5–15.5)
WBC: 13.7 10*3/uL — ABNORMAL HIGH (ref 4.0–10.5)
nRBC: 0 % (ref 0.0–0.2)

## 2020-08-11 LAB — BRAIN NATRIURETIC PEPTIDE: B Natriuretic Peptide: 205.9 pg/mL — ABNORMAL HIGH (ref 0.0–100.0)

## 2020-08-11 LAB — RESP PANEL BY RT-PCR (FLU A&B, COVID) ARPGX2
Influenza A by PCR: NEGATIVE
Influenza B by PCR: NEGATIVE
SARS Coronavirus 2 by RT PCR: NEGATIVE

## 2020-08-11 LAB — TROPONIN I (HIGH SENSITIVITY)
Troponin I (High Sensitivity): 6 ng/L (ref <18)
Troponin I (High Sensitivity): 7 ng/L (ref <18)

## 2020-08-11 LAB — LIPASE, BLOOD: Lipase: 29 U/L (ref 11–51)

## 2020-08-11 NOTE — ED Notes (Signed)
Vomited once, moderate amout brown bilious emesis in triage.  Zofran offered to patient, patient declined at this time.

## 2020-08-11 NOTE — ED Triage Notes (Signed)
°  C/O SOB x 2 days.  Tested for covid earlier this week, was negative.  Also c/o nauseated and headache.  AAOx3.  Skin warm and dry. NAD

## 2020-08-12 ENCOUNTER — Emergency Department
Admission: EM | Admit: 2020-08-12 | Discharge: 2020-08-12 | Disposition: A | Discharge status: Home / Self Care | Payer: Medicare Other | Attending: Emergency Medicine | Admitting: Emergency Medicine

## 2020-08-12 ENCOUNTER — Encounter: Payer: Self-pay | Admitting: Emergency Medicine

## 2020-08-12 DIAGNOSIS — J40 Bronchitis, not specified as acute or chronic: Secondary | ICD-10-CM

## 2020-08-12 HISTORY — DX: Chronic pain syndrome: G89.4

## 2020-08-12 MED ORDER — POTASSIUM CHLORIDE CRYS ER 20 MEQ PO TBCR
40.0000 meq | EXTENDED_RELEASE_TABLET | Freq: Once | ORAL | Status: AC
  Administered 2020-08-12: 01:00:00 40 meq via ORAL
  Filled 2020-08-12: qty 2

## 2020-08-12 MED ORDER — ALBUTEROL SULFATE HFA 108 (90 BASE) MCG/ACT IN AERS: INHALATION_SPRAY | RESPIRATORY_TRACT | 0 refills | Status: AC

## 2020-08-12 MED ORDER — HYDROCOD POLST-CPM POLST ER 10-8 MG/5ML PO SUER
5.0000 mL | Freq: Once | ORAL | Status: AC
  Administered 2020-08-12: 5 mL via ORAL
  Filled 2020-08-12: qty 5

## 2020-08-12 MED ORDER — ACETAMINOPHEN 500 MG PO TABS
1000.0000 mg | ORAL_TABLET | Freq: Once | ORAL | Status: AC
  Administered 2020-08-12: 01:00:00 1000 mg via ORAL
  Filled 2020-08-12: qty 2

## 2020-08-12 MED ORDER — PREDNISONE 20 MG PO TABS
60.0000 mg | ORAL_TABLET | ORAL | Status: AC
  Administered 2020-08-12: 01:00:00 60 mg via ORAL
  Filled 2020-08-12: qty 3

## 2020-08-12 MED ORDER — PREDNISONE 20 MG PO TABS: 60.0000 mg | ORAL_TABLET | Freq: Every day | ORAL | 0 refills | Status: AC

## 2020-08-12 MED ORDER — HYDROCOD POLST-CPM POLST ER 10-8 MG/5ML PO SUER: 5.0000 mL | Freq: Two times a day (BID) | ORAL | 0 refills | Status: AC | PRN

## 2020-08-12 NOTE — Discharge Instructions (Addendum)
Your work-up was reassuring tonight with no evidence of pneumonia on x-ray, normal EKG, and generally normal lab work.  It is important that you try to decrease or stop smoking and it is likely that you have at least a degree of chronic bronchitis (COPD).  Please take the medications as prescribed tonight and use them according to label instructions.  Follow-up with your regular doctor at the next available opportunity.  Return to the emergency department if you develop new or worsening symptoms that concern you.

## 2020-08-12 NOTE — ED Notes (Signed)
Pt signed esignature.  D/c  inst to pt.  

## 2020-08-12 NOTE — ED Notes (Signed)
Pt reports a headache since last night. Pt took motrin without relief.  Pt also reports elevated blood pressure.  Pt was taken off hctz recently.  Pt denies chest pain.  Intermittent sob for past few days.  Recent cold sx.  Pt alert  Speech clear.

## 2020-08-12 NOTE — ED Provider Notes (Signed)
[facility] Emergency Department Provider Note  ____________________________________________   Event Date/Time   First MD Initiated Contact with Patient 08/12/20 [phone]     (approximate)  I have reviewed the triage vital signs and the nursing notes.   HISTORY  Chief Complaint Shortness of Breath and Headache    HPI Karen Chen is a 59 y.o. female with medical history as listed below who presents for evaluation of about a week of respiratory symptoms including runny nose, nasal congestion,  cough, and occasional wheezing.  She said she has not felt right for about a week.  She has been tested for COVID-19 and was negative although she is unvaccinated.  She said that multiple members of her family had similar symptoms for the same time period.  She reports that she has had multiple episodes of bronchitis in the past.  She has never been told that she has COPD although she continues to smoke.  She has a headache currently. . She denies chest pain, shortness of breath, abdominal pain.  She has had some nausea but no vomiting.  Nothing particular makes her symptoms better or worse and she describes them as severe.        Past Medical History:  Diagnosis Date  . Anxiety   . Chronic lower back pain   . Chronic pain syndrome    uses Suboxone  . Diabetes mellitus without complication (HCC)   . Hypertension   . Restless leg   . Rheumatoid arthritis [facility])     Patient Active Problem List   Diagnosis Date Noted  . Chronic venous insufficiency 03/16/2019  . Encephalopathy 12/21/2017  . Myalgia 08/23/2017  . Tremor, unspecified 08/23/2017  . Insomnia due to other mental disorder 08/23/2017  . Hypotension, unspecified 08/23/2017  . Major depressive disorder, recurrent, severe with psychotic symptoms (HCC) 08/23/2017  . Wheezing 08/23/2017  . Tinea pedis 08/23/2017  . Mild intermittent asthma, uncomplicated 08/23/2017  . Primary generalized  (osteo)arthritis 08/23/2017  . Nausea 08/23/2017  . Peripheral vascular disease (HCC) 08/23/2017  . Generalized anxiety disorder 08/23/2017  . Nicotine dependence, cigarettes, uncomplicated 08/23/2017  . Hypokalemia 04/24/2017  . Vitamin D insufficiency 04/24/2017  . Failed back surgical syndrome 03/09/2017  . Grade II Anterolisthesis of L4 over L5 03/09/2017  . Epidural fibrosis 03/09/2017  . Chronic L4 bilateral pars defect 03/09/2017  . L2-3 and L3-4 intervertebral disc extrusion 03/09/2017  . Abnormal MRI, lumbar spine (09/28/2016) 03/09/2017  . Lumbar facet hypertrophy 03/09/2017  . Lumbar facet syndrome (Bilateral) (L>R) 03/09/2017  . Lumbar L3-4 lateral recess stenosis (Bilateral) (L>R) 03/09/2017  . Chronic pain syndrome 03/07/2017  . Long-term use of high-risk medication 03/07/2017  . Long term prescription benzodiazepine use 03/07/2017  . Chronic low back pain (Primary Source of Pain) (Bilateral) (L>R) 03/07/2017  . Chronic lower extremity pain (Secondary source of pain) (Bilateral) (L>R) 03/07/2017  . Chronic hand pain [facility] source of pain) (Bilateral) (R>L) 03/07/2017  . Chronic neck pain (Posterior) (Bilateral) (R>L) 03/07/2017  . Chronic shoulder pain (Bilateral) (R>L) 03/07/2017  . Chronic foot pain (Bilateral) (R>L) 03/07/2017  . TIA (transient ischemic attack) 09/16/2016  . CVA (cerebral vascular accident) (HCC) 09/15/2016  . Rheumatoid arthritis involving multiple sites with positive rheumatoid factor (HCC) 05/04/2016  . ADHD (attention deficit hyperactivity disorder) 03/25/2015  . Bite from insect 03/25/2015  . PTSD (post-traumatic stress disorder) 03/25/2015  . Anxiety 03/26/2014  . Hypertension 03/26/2014  . Restless leg 09/20/2013    Past Surgical History:  Procedure Laterality Date  . BACK SURGERY  1980    Prior to Admission medications   Medication Sig Start Date End Date Taking? Authorizing Provider  albuterol (VENTOLIN HFA) 108 (90 Base)  MCG/ACT inhaler Inhale 2-4 puffs by mouth every 4 hours as needed for wheezing, cough, and/or shortness of breath 08/12/20   Loleta Rose, MD  amoxicillin-clavulanate (AUGMENTIN) 875-125 MG tablet Take 1 tablet by mouth every 12 (twelve) hours. Patient not taking: Reported on 09/08/2018 12/26/17   Alford Highland, MD  aspirin 81 MG tablet Take 81 mg by mouth daily.    [provider]  Buprenorphine HCl-Naloxone HCl 2-0.5 MG FILM  10/19/18   [provider]  Calcium Carb-Cholecalciferol (CALCIUM PLUS D3 ABSORBABLE) [phone] MG-UNIT CAPS Take 1 capsule by mouth daily with breakfast. Patient not taking: Reported on 03/04/2019 04/24/17 07/23/17  Delano Metz, MD  chlorpheniramine-HYDROcodone [facility] PENNKINETIC ER) 10-8 MG/5ML SUER Take 5 mLs by mouth every 12 (twelve) hours as needed for cough. 08/12/20   Loleta Rose, MD  escitalopram (LEXAPRO) 20 MG tablet 2 tabs po daily 04/20/18   Carlean Jews, NP  gabapentin (NEURONTIN) 300 MG capsule Take 2 capsules (600 mg total) by mouth 2 (two) times daily. 12/26/17   Alford Highland, MD  hydrochlorothiazide (HYDRODIURIL) 25 MG tablet Take 1 tablet (25 mg total) by mouth daily. 04/23/18   Carlean Jews, NP  Multiple Vitamin (MULTIVITAMIN WITH MINERALS) TABS tablet Take 1 tablet by mouth daily. Patient not taking: Reported on 03/04/2019 12/26/17   Alford Highland, MD  ondansetron (ZOFRAN ODT) 4 MG disintegrating tablet Take 1 tablet (4 mg total) by mouth every 8 (eight) hours as needed for nausea or vomiting. Patient not taking: Reported on 03/04/2019 03/01/18   Darci Current, MD  oxyCODONE-acetaminophen (PERCOCET/ROXICET) 5-325 MG tablet Take 1 tablet by mouth every 8 (eight) hours as needed.     [provider]  potassium chloride (K-DUR) 10 MEQ tablet Take 2 tablets (20 mEq total) by mouth daily. Take two tablets. 11/07/17   Carlean Jews, NP  predniSONE (DELTASONE) 20 MG tablet Take 3 tablets (60 mg total) by mouth  daily. 08/12/20   Loleta Rose, MD  rOPINIRole (REQUIP) 1 MG tablet Take 1 tab po tid for restless leg syndrome 04/24/18   Lyndon Code, MD    Allergies Haldol [haloperidol lactate], Orencia [abatacept], Sulfa antibiotics, Azathioprine, Indocin [indomethacin], Levaquin [levofloxacin in d5w], Methotrexate derivatives, Prozac [fluoxetine hcl], Remeron [mirtazapine], Remicade [infliximab], Robaxin [methocarbamol], Tizanidine, Zanaflex [tizanidine hcl], and Adalimumab  Family History  Problem Relation Age of Onset  . Heart failure Father   . Hypertension Father   . Diabetes Sister     Social History Social History   Tobacco Use  . Smoking status: Current Every Day Smoker    Packs/day: 1.00    Years: 35.00    Pack years: 35.00  . Smokeless tobacco: Never Used  Vaping Use  . Vaping Use: Former  Substance Use Topics  . Alcohol use: No  . Drug use: No    Review of Systems Constitutional: No fever/chills Eyes: No visual changes. ENT: Nasal congestion and runny nose. Cardiovascular: Denies chest pain. Respiratory: Occasional cough and wheezing but no shortness of breath in and of itself. Gastrointestinal: No abdominal pain.  Nausea, no vomiting.  No diarrhea.  No constipation. Genitourinary: Negative for dysuria. Musculoskeletal: Generalized body aches.  Negative for neck pain.  Negative for back pain. Integumentary: Negative for rash. Neurological: Negative for headaches, focal weakness or  numbness.   ____________________________________________   PHYSICAL EXAM:  VITAL SIGNS: ED Triage Vitals  Enc Vitals Group     BP 08/11/20 1629 (!) 175/84     Pulse Rate 08/11/20 1629 64     Resp 08/11/20 1629 16     Temp 08/11/20 1629 98.1 F (36.7 C)     Temp Source 08/11/20 1629 Oral     SpO2 08/11/20 1629 97 %     Weight 08/11/20 1629 59 kg (130 lb 1.1 oz)     Height 08/11/20 1629 1.727 m (5\' 8" )     Head Circumference --      Peak Flow --      Pain Score 08/11/20 1628 7      Pain Loc --      Pain Edu? --      Excl. in GC? --     Constitutional: Alert and oriented.  Has the appearance of chronic respiratory illness (emphysema). Eyes: Conjunctivae are normal.  Head: Atraumatic. Nose: No congestion/rhinnorhea. Mouth/Throat: Patient is wearing a mask. Neck: No stridor.  No meningeal signs.   Cardiovascular: Normal rate, regular rhythm. Good peripheral circulation. Respiratory: Normal respiratory effort.  No retractions.  Frequent cough.  Mild expiratory wheezing throughout lung fields upon auscultation. Gastrointestinal: Soft and nontender. No distention.  Musculoskeletal: No lower extremity tenderness nor edema. No gross deformities of extremities. Neurologic:  Normal speech and language. No gross focal neurologic deficits are appreciated.  Patient has a tremor which is documented in her medical history as well, although at times when she is answering questions the tremor is completely suppressed. Skin:  Skin is warm, dry and intact. Psychiatric: Mood and affect are quite anxious but otherwise unremarkable.  ____________________________________________   LABS (all labs ordered are listed, but only abnormal results are displayed)  Labs Reviewed  COMPREHENSIVE METABOLIC PANEL - Abnormal; Notable for the following components:      Result Value   Potassium 3.1 (*)    Glucose, Bld 121 (*)    Calcium 8.8 (*)    Albumin 3.3 (*)    All other components within normal limits  CBC - Abnormal; Notable for the following components:   WBC 13.7 (*)    Platelets 419 (*)    All other components within normal limits  URINALYSIS, COMPLETE (UACMP) WITH MICROSCOPIC - Abnormal; Notable for the following components:   Color, Urine YELLOW (*)    APPearance HAZY (*)    Leukocytes,Ua SMALL (*)    Bacteria, UA RARE (*)    All other components within normal limits  BRAIN NATRIURETIC PEPTIDE - Abnormal; Notable for the following components:   B Natriuretic Peptide 205.9 (*)     All other components within normal limits  RESP PANEL BY RT-PCR (FLU A&B, COVID) ARPGX2  LIPASE, BLOOD  TROPONIN I (HIGH SENSITIVITY)  TROPONIN I (HIGH SENSITIVITY)   ____________________________________________  EKG  ED ECG REPORT I, 08/13/20, the attending physician, personally viewed and interpreted this ECG.  Date: 08/11/2020 EKG Time: 21: 25 Rate: 55 Rhythm: Sinus bradycardia QRS Axis: normal Intervals: normal ST/T Wave abnormalities: normal Narrative Interpretation: no evidence of acute ischemia  ____________________________________________  RADIOLOGY I, 08/13/2020, personally viewed and evaluated these images (plain radiographs) as part of my medical decision making, as well as reviewing the written report by the radiologist.  ED MD interpretation: No indication of emergent medical condition on chest x-ray  Official radiology report(s): DG Chest 2 View  Result Date: 08/11/2020 CLINICAL DATA:  Shortness of  breath for 2 days. EXAM: CHEST - 2 VIEW COMPARISON:  06/18/2020 FINDINGS: The cardiomediastinal contours are normal. Chronic bronchial thickening. Pulmonary vasculature is normal. No consolidation, pleural effusion, or pneumothorax. No acute osseous abnormalities are seen. IMPRESSION: Chronic bronchial thickening without acute abnormality. Electronically Signed   By: Narda Rutherford M.D.   On: 08/11/2020 18:11    ____________________________________________   PROCEDURES   Procedure(s) performed (including Critical Care):  Procedures   ____________________________________________   INITIAL IMPRESSION / MDM / ASSESSMENT AND PLAN / ED COURSE  As part of my medical decision making, I reviewed the following data within the electronic MEDICAL RECORD NUMBER Nursing notes reviewed and incorporated, Labs reviewed , EKG interpreted , Old chart reviewed, Radiograph reviewed , Notes from prior ED visits and reviewed [place] Controlled Substance  Database   Differential diagnosis includes, but is not limited to, COPD, acute bronchitis, wheezing associated respiratory illness, COVID-19, pneumonia, influenza, anxiety, opioid withdrawal.  The patient was on the cardiac monitor to evaluate for evidence of arrhythmia and/or significant heart rate changes.  The patient's vital signs have been stable without tachycardia for nearly 9 hours in the emergency department while awaiting evaluation due to overwhelming ED and hospital patient volumes.  EKG shows no evidence of ischemia.  I personally reviewed the patient's imaging and agree with the radiologist's interpretation that there is no evidence of any acute abnormality such as pneumonia on her chest x-ray, just evidence of chronic obstructive changes.  Lab work is generally reassuring, very minimal BNP elevation but without radiographic evidence of edema.  Comprehensive metabolic panel is normal other than a decreased potassium and I provided 40 mEq by mouth for repletion.  High-sensitivity troponin is negative x2.  Respiratory viral panel is negative for influenza and COVID-19.  Urinalysis is unremarkable other than some small leukocytes but she has no urinary symptoms.  Lipase is normal.  CBC is normal except for a mild leukocytosis of 13.7.  I reviewed the patient's medical record and see that she has multiple prior visits to her primary care doctor and urgent cares for bronchitis or bronchitis-like symptoms.  She does not have a specific diagnosis of COPD.  I also reviewed her [place] controlled substance database and it looks like she had a prescription for Suboxone that was scheduled to run out about a week ago.  This is about the time she said her symptoms began.  It is possible she is having a degree of narcotics withdrawal as well, but overall her appearance is reassuring with no evidence of an acute or emergent medical condition requiring further work-up or inpatient treatment.  I  counseled her about the need for her to stop smoking.  I also explained the relatively reassuring results of her work-up today.  She said that she does not use albuterol because she is afraid she will be allergic to it, and she also avoids taking cough suppressants.  I counseled her that she needs to follow-up with her primary care doctor and that I recommend she try using an albuterol rescue inhaler.  She has been successful on prednisone in the past according to her medical record so I am giving her a dose of prednisone 60 mg by mouth tonight as well as a dose of Tussionex and the potassium supplement as previously described.  I wrote prescriptions as listed below and I encourage close outpatient follow-up.  I gave my usual and customary return precautions.  She understands and agrees with the plan.  ____________________________________________  FINAL CLINICAL IMPRESSION(S) / ED DIAGNOSES  Final diagnoses:  Bronchitis with acute wheezing     MEDICATIONS GIVEN DURING THIS VISIT:  Medications  predniSONE (DELTASONE) tablet 60 mg (60 mg Oral Given 08/12/20 0112)  chlorpheniramine-HYDROcodone (TUSSIONEX) 10-8 MG/5ML suspension 5 mL (5 mLs Oral Given 08/12/20 0112)  acetaminophen (TYLENOL) tablet 1,000 mg (1,000 mg Oral Given 08/12/20 0112)  potassium chloride SA (KLOR-CON) CR tablet 40 mEq (40 mEq Oral Given 08/12/20 0112)     ED Discharge Orders         Ordered    albuterol (VENTOLIN HFA) 108 (90 Base) MCG/ACT inhaler       Note to Pharmacy: Pharmacy may substitute brand and size for insurance-approved equivalent   08/12/20 0107    predniSONE (DELTASONE) 20 MG tablet  Daily        08/12/20 0107    chlorpheniramine-HYDROcodone (TUSSIONEX PENNKINETIC ER) 10-8 MG/5ML SUER  Every 12 hours PRN        08/12/20 0107          *Please note:  Karen Chen was evaluated in Emergency Department on 08/12/2020 for the symptoms described in the history of present illness. She was  evaluated in the context of the global COVID-19 pandemic, which necessitated consideration that the patient might be at risk for infection with the SARS-CoV-2 virus that causes COVID-19. Institutional protocols and algorithms that pertain to the evaluation of patients at risk for COVID-19 are in a state of rapid change based on information released by regulatory bodies including the CDC and federal and state organizations. These policies and algorithms were followed during the patient's care in the ED.  Some ED evaluations and interventions may be delayed as a result of limited staffing during and after the pandemic.*  Note:  This document was prepared using Dragon voice recognition software and may include unintentional dictation errors.   Loleta Rose, MD 08/12/20 [phone]

## 2020-12-04 ENCOUNTER — Telehealth: Payer: Self-pay | Admitting: *Deleted

## 2020-12-04 DIAGNOSIS — Z87891 Personal history of nicotine dependence: Secondary | ICD-10-CM

## 2020-12-04 DIAGNOSIS — Z122 Encounter for screening for malignant neoplasm of respiratory organs: Secondary | ICD-10-CM

## 2020-12-04 DIAGNOSIS — F17200 Nicotine dependence, unspecified, uncomplicated: Secondary | ICD-10-CM

## 2020-12-04 NOTE — Telephone Encounter (Signed)
Received referral for low dose lung cancer screening CT scan. Message left at phone number listed in EMR for patient to call me back to facilitate scheduling scan.  

## 2020-12-04 NOTE — Telephone Encounter (Signed)
Patient returned my call and provided smoking history,(current every day smoker, 1 ppd x 35 years) as well as answering questions related to screening process. Patient denies signs of lung cancer such as weight loss or hemoptysis. Patient denies comorbidity that would prevent curative treatment if lung cancer were found. Patient is scheduled for shared decision making visit and CT scan on 01/06/21 @ 11:00 am.

## 2020-12-08 ENCOUNTER — Encounter: Payer: Self-pay | Admitting: *Deleted

## 2021-01-06 ENCOUNTER — Inpatient Hospital Stay: Payer: Medicare HMO | Admitting: Hospice and Palliative Medicine

## 2021-01-06 ENCOUNTER — Ambulatory Visit: Admission: RE | Admit: 2021-01-06 | Payer: Medicare HMO | Source: Ambulatory Visit

## 2021-05-12 ENCOUNTER — Other Ambulatory Visit: Payer: Self-pay | Admitting: Physician Assistant

## 2021-05-12 DIAGNOSIS — Z1231 Encounter for screening mammogram for malignant neoplasm of breast: Secondary | ICD-10-CM

## 2021-07-02 ENCOUNTER — Other Ambulatory Visit: Payer: Self-pay

## 2021-07-02 DIAGNOSIS — Z5321 Procedure and treatment not carried out due to patient leaving prior to being seen by health care provider: Secondary | ICD-10-CM | POA: Diagnosis not present

## 2021-07-02 DIAGNOSIS — U071 COVID-19: Secondary | ICD-10-CM | POA: Insufficient documentation

## 2021-07-02 DIAGNOSIS — R0981 Nasal congestion: Secondary | ICD-10-CM | POA: Diagnosis present

## 2021-07-02 DIAGNOSIS — R110 Nausea: Secondary | ICD-10-CM | POA: Insufficient documentation

## 2021-07-02 LAB — COMPREHENSIVE METABOLIC PANEL
ALT: 17 U/L (ref 0–44)
AST: 19 U/L (ref 15–41)
Albumin: 3.1 g/dL — ABNORMAL LOW (ref 3.5–5.0)
Alkaline Phosphatase: 77 U/L (ref 38–126)
Anion gap: 9 (ref 5–15)
BUN: 21 mg/dL — ABNORMAL HIGH (ref 6–20)
CO2: 21 mmol/L — ABNORMAL LOW (ref 22–32)
Calcium: 8.5 mg/dL — ABNORMAL LOW (ref 8.9–10.3)
Chloride: 106 mmol/L (ref 98–111)
Creatinine, Ser: 0.74 mg/dL (ref 0.44–1.00)
GFR, Estimated: >60 mL/min (ref >60)
Glucose, Bld: 232 mg/dL — ABNORMAL HIGH (ref 70–99)
Potassium: 3.7 mmol/L (ref 3.5–5.1)
Sodium: 136 mmol/L (ref 135–145)
Total Bilirubin: 0.6 mg/dL (ref 0.3–1.2)
Total Protein: 6.7 g/dL (ref 6.5–8.1)

## 2021-07-02 LAB — CBC
HCT: 43.9 % (ref 36.0–46.0)
Hemoglobin: 14 g/dL (ref 12.0–15.0)
MCH: 29.7 pg (ref 26.0–34.0)
MCHC: 31.9 g/dL (ref 30.0–36.0)
MCV: 93.2 fL (ref 80.0–100.0)
Platelets: 423 10*3/uL — ABNORMAL HIGH (ref 150–400)
RBC: 4.71 MIL/uL (ref 3.87–5.11)
RDW: 13.8 % (ref 11.5–15.5)
WBC: 9 10*3/uL (ref 4.0–10.5)
nRBC: 0 % (ref 0.0–0.2)

## 2021-07-02 LAB — LIPASE, BLOOD: Lipase: 39 U/L (ref 11–51)

## 2021-07-02 NOTE — ED Triage Notes (Signed)
Pt presents ambulatory to triage with c/o "illness for 3 weeks". Endorses congestion, decreased PO intake, diarrhea.   Pt states she has not taken any of her medications for past few weeks r/t nausea. Denies emesis.

## 2021-07-03 ENCOUNTER — Emergency Department
Admission: EM | Admit: 2021-07-03 | Discharge: 2021-07-03 | Disposition: A | Discharge status: Home / Self Care | Payer: Medicare Other | Attending: Emergency Medicine | Admitting: Emergency Medicine

## 2021-07-03 LAB — RESP PANEL BY RT-PCR (FLU A&B, COVID) ARPGX2
Influenza A by PCR: NEGATIVE
Influenza B by PCR: NEGATIVE
SARS Coronavirus 2 by RT PCR: POSITIVE — AB

## 2021-07-03 NOTE — ED Notes (Signed)
Pt called second time without answer

## 2021-07-03 NOTE — ED Notes (Signed)
Attempted to contact pt via phone number in chart without answer.

## 2021-07-03 NOTE — ED Notes (Signed)
Pt called no response

## 2021-07-09 ENCOUNTER — Telehealth: Payer: Self-pay | Admitting: Program of All-Inclusive Care for the Elderly (PACE) Provider Organization

## 2021-09-16 ENCOUNTER — Telehealth: Payer: Self-pay

## 2021-11-25 ENCOUNTER — Other Ambulatory Visit: Payer: Self-pay | Admitting: Student

## 2021-11-25 DIAGNOSIS — Z1231 Encounter for screening mammogram for malignant neoplasm of breast: Secondary | ICD-10-CM

## 2021-11-30 DIAGNOSIS — M069 Rheumatoid arthritis, unspecified: Secondary | ICD-10-CM | POA: Insufficient documentation

## 2021-11-30 DIAGNOSIS — I83893 Varicose veins of bilateral lower extremities with other complications: Secondary | ICD-10-CM | POA: Insufficient documentation

## 2021-12-16 NOTE — Telephone Encounter (Signed)
error 

## 2022-02-08 ENCOUNTER — Other Ambulatory Visit: Payer: Self-pay | Admitting: Nurse Practitioner

## 2022-02-08 DIAGNOSIS — Z1231 Encounter for screening mammogram for malignant neoplasm of breast: Secondary | ICD-10-CM

## 2022-04-12 NOTE — Telephone Encounter (Signed)
NA

## 2022-04-12 NOTE — Telephone Encounter (Signed)
Na

## 2022-05-05 ENCOUNTER — Other Ambulatory Visit (INDEPENDENT_AMBULATORY_CARE_PROVIDER_SITE_OTHER): Payer: Self-pay | Admitting: Nurse Practitioner

## 2022-05-05 DIAGNOSIS — R600 Localized edema: Secondary | ICD-10-CM

## 2022-05-08 NOTE — Progress Notes (Deleted)
MRN : [id number]  Karen Chen is a 61 y.o. (14-Sep-1960) female who presents with chief complaint of legs hurt and swell.  History of Present Illness:   Patient is seen for evaluation of leg swelling. The patient first noticed the swelling remotely but is now concerned because of a significant increase in the overall edema. The swelling isn't associated with significant pain.  There has been an increasing amount of  discoloration noted by the patient. The patient notes that in the morning the legs are improved but they steadily worsened throughout the course of the day. Elevation seems to make the swelling of the legs better, dependency makes them much worse.   There is no history of ulcerations associated with the swelling.   The patient denies any recent changes in their medications.  The patient has not been wearing graduated compression.  The patient has no had any past angiography, interventions or vascular surgery.  The patient denies a history of DVT or PE. There is no prior history of phlebitis. There is no history of primary lymphedema.  There is no history of radiation treatment to the groin or pelvis No history of malignancies. No history of trauma or groin or pelvic surgery. No history of foreign travel or parasitic infections area    No outpatient medications have been marked as taking for the 05/09/22 encounter (Appointment) with Gilda Crease, Latina Craver, MD.    Past Medical History:  Diagnosis Date   Anxiety    Chronic lower back pain    Chronic pain syndrome    uses Suboxone   Diabetes mellitus without complication (HCC)    Hypertension    Restless leg    Rheumatoid arthritis (HCC)     Past Surgical History:  Procedure Laterality Date   BACK SURGERY  1980    Social History Social History   Tobacco Use   Smoking status: Every Day    Packs/day: 0.25    Years: 35.00    Total pack years: 8.75    Types: Cigarettes   Smokeless tobacco: Never   Vaping Use   Vaping Use: Former  Substance Use Topics   Alcohol use: No   Drug use: No    Family History Family History  Problem Relation Age of Onset   Heart failure Father    Hypertension Father    Diabetes Sister     Allergies  Allergen Reactions   Haldol [Haloperidol Lactate]    Orencia [Abatacept] Anaphylaxis   Sulfa Antibiotics Anaphylaxis   Azathioprine Other (See Comments)   Indocin [Indomethacin]    Levaquin [Levofloxacin In D5w]    Methotrexate Derivatives    Prozac [Fluoxetine Hcl]    Remeron [Mirtazapine]    Remicade [Infliximab] Hives   Robaxin [Methocarbamol]    Tizanidine    Zanaflex [Tizanidine Hcl] Other (See Comments)    Patient states a burning sensation in her mouth 30 minutes after taking.   Adalimumab Itching and Rash     REVIEW OF SYSTEMS (Negative unless checked)  Constitutional: [] Weight loss  [] Fever  [] Chills Cardiac: [] Chest pain   [] Chest pressure   [] Palpitations   [] Shortness of breath when laying flat   [] Shortness of breath with exertion. Vascular:  [] Pain in legs with walking   [x] Pain in legs at rest  [] History of DVT   [] Phlebitis   [x] Swelling in legs   [] Varicose veins   [] Non-healing ulcers Pulmonary:   [] Uses home oxygen   []   Productive cough   [] Hemoptysis   [] Wheeze  [] COPD   [] Asthma Neurologic:  [] Dizziness   [] Seizures   [] History of stroke   [] History of TIA  [] Aphasia   [] Vissual changes   [] Weakness or numbness in arm   [] Weakness or numbness in leg Musculoskeletal:   [] Joint swelling   [] Joint pain   [] Low back pain Hematologic:  [] Easy bruising  [] Easy bleeding   [] Hypercoagulable state   [] Anemic Gastrointestinal:  [] Diarrhea   [] Vomiting  [] Gastroesophageal reflux/heartburn   [] Difficulty swallowing. Genitourinary:  [] Chronic kidney disease   [] Difficult urination  [] Frequent urination   [] Blood in urine Skin:  [] Rashes   [] Ulcers  Psychological:  [] History of anxiety   []  History of major depression.  Physical  Examination  There were no vitals filed for this visit. There is no height or weight on file to calculate BMI. Gen: WD/WN, NAD Head: [place]/AT, No temporalis wasting.  Ear/Nose/Throat: Hearing grossly intact, nares w/o erythema or drainage, pinna without lesions Eyes: PER, EOMI, sclera nonicteric.  Neck: Supple, no gross masses.  No JVD.  Pulmonary:  Good air movement, no audible wheezing, no use of accessory muscles.  Cardiac: RRR, precordium not hyperdynamic. Vascular:  scattered varicosities present bilaterally.  Moderate venous stasis changes to the legs bilaterally.  2+ soft pitting edema  Vessel Right Left  Radial Palpable Palpable  Gastrointestinal: soft, non-distended. No guarding/no peritoneal signs.  Musculoskeletal: M/S 5/5 throughout.  No deformity.  Neurologic: CN 2-12 intact. Pain and light touch intact in extremities.  Symmetrical.  Speech is fluent. Motor exam as listed above. Psychiatric: Judgment intact, Mood & affect appropriate for pt's clinical situation. Dermatologic: Venous rashes no ulcers noted.  No changes consistent with cellulitis. Lymph : No lichenification or skin changes of chronic lymphedema.  CBC Lab Results  Component Value Date   WBC 9.0 07/02/2021   HGB 14.0 07/02/2021   HCT 43.9 07/02/2021   MCV 93.2 07/02/2021   PLT 423 (H) 07/02/2021    BMET    Component Value Date/Time   NA 136 07/02/2021 2306   NA 144 03/07/2017 1321   NA 140 06/15/2014 0902   K 3.7 07/02/2021 2306   K 3.5 06/15/2014 0902   CL 106 07/02/2021 2306   CL 105 06/15/2014 0902   CO2 21 (L) 07/02/2021 2306   CO2 28 06/15/2014 0902   GLUCOSE 232 (H) 07/02/2021 2306   GLUCOSE 98 06/15/2014 0902   BUN 21 (H) 07/02/2021 2306   BUN 15 03/07/2017 1321   BUN 13 06/15/2014 0902   CREATININE 0.74 07/02/2021 2306   CREATININE 0.67 06/15/2014 0902   CALCIUM 8.5 (L) 07/02/2021 2306   CALCIUM 7.9 (L) 06/15/2014 0902   GFRNONAA >60 07/02/2021 2306   GFRNONAA >60 06/15/2014 0902    GFRNONAA >60 10/12/2012 1409   GFRAA >60 09/08/2018 0138   GFRAA >60 06/15/2014 0902   GFRAA >60 10/12/2012 1409   CrCl cannot be calculated (Patient's most recent lab result is older than the maximum 21 days allowed.).  COAG Lab Results  Component Value Date   INR 0.88 12/21/2017    Radiology No results found.   Assessment/Plan There are no diagnoses linked to this encounter.   , MD  05/08/2022 8:50 PM

## 2022-05-09 ENCOUNTER — Encounter (INDEPENDENT_AMBULATORY_CARE_PROVIDER_SITE_OTHER): Payer: Medicare Other | Admitting: Vascular Surgery

## 2022-05-09 ENCOUNTER — Encounter (INDEPENDENT_AMBULATORY_CARE_PROVIDER_SITE_OTHER): Payer: Medicare Other

## 2022-05-09 DIAGNOSIS — M5126 Other intervertebral disc displacement, lumbar region: Secondary | ICD-10-CM

## 2022-05-09 DIAGNOSIS — I10 Essential (primary) hypertension: Secondary | ICD-10-CM

## 2022-05-09 DIAGNOSIS — I872 Venous insufficiency (chronic) (peripheral): Secondary | ICD-10-CM

## 2022-05-09 DIAGNOSIS — J4520 Mild intermittent asthma, uncomplicated: Secondary | ICD-10-CM

## 2022-05-10 DIAGNOSIS — Z7952 Long term (current) use of systemic steroids: Secondary | ICD-10-CM | POA: Insufficient documentation

## 2022-06-27 ENCOUNTER — Encounter (INDEPENDENT_AMBULATORY_CARE_PROVIDER_SITE_OTHER): Payer: Self-pay

## 2022-08-07 DIAGNOSIS — I8310 Varicose veins of unspecified lower extremity with inflammation: Secondary | ICD-10-CM | POA: Insufficient documentation

## 2022-08-07 NOTE — Progress Notes (Signed)
MRN : [id number]  Karen Chen is a 61 y.o. (1961-05-15) female who presents with chief complaint of varicose veins hurt.  History of Present Illness:   The patient is seen for evaluation of leg pain associated with varicose veins. The patient relates burning and stinging which worsened steadily throughout the course of the day, particularly with standing.  Patient notes the pain is variable and not always associated with activity.  The pain is somewhat consistent day to day occurring on most days. The patient notes the pain also occurs with standing and routinely seems worse as the day wears on. The pain has been progressive over the past several years. The patient states these symptoms are causing  a negative impact on quality of life and daily activities which was a factor in the referral.  The patient has a  history of rheumatoid DJD.   The patient denies rest pain or dangling of an extremity off the side of the bed during the night for relief. No open wounds or sores at this time.  No prior vascular interventions or surgeries.  There is no history of DVT, PE or superficial thrombophlebitis.  The patient has not worn graduated compression in the past. At the present time the patient has not been using over-the-counter analgesics. There is no history of prior surgical intervention or sclerotherapy.   No outpatient medications have been marked as taking for the 08/08/22 encounter (Appointment) with Delana Meyer, Dolores Lory, MD.    Past Medical History:  Diagnosis Date   Anxiety    Chronic lower back pain    Chronic pain syndrome    uses Suboxone   Diabetes mellitus without complication (HCC)    Hypertension    Restless leg    Rheumatoid arthritis ([place])     Past Surgical History:  Procedure Laterality Date   BACK SURGERY  1980    Social History Social History   Tobacco Use   Smoking status: Every Day    Packs/day: 0.25    Years: 35.00    Total pack years: 8.75     Types: Cigarettes   Smokeless tobacco: Never  Vaping Use   Vaping Use: Former  Substance Use Topics   Alcohol use: No   Drug use: No    Family History Family History  Problem Relation Age of Onset   Heart failure Father    Hypertension Father    Diabetes Sister     Allergies  Allergen Reactions   Haldol [Haloperidol Lactate]    Orencia [Abatacept] Anaphylaxis   Sulfa Antibiotics Anaphylaxis   Azathioprine Other (See Comments)   Indocin [Indomethacin]    Levaquin [Levofloxacin In D5w]    Methotrexate Derivatives    Prozac [Fluoxetine Hcl]    Remeron [Mirtazapine]    Remicade [Infliximab] Hives   Robaxin [Methocarbamol]    Tizanidine    Zanaflex [Tizanidine Hcl] Other (See Comments)    Patient states a burning sensation in her mouth 30 minutes after taking.   Adalimumab Itching and Rash     REVIEW OF SYSTEMS (Negative unless checked)  Constitutional: [] Weight loss  [] Fever  [] Chills Cardiac: [] Chest pain   [] Chest pressure   [] Palpitations   [] Shortness of breath when laying flat   [] Shortness of breath with exertion. Vascular:  [] Pain in legs with walking   [x] Pain in legs with standing  [] History of DVT   [] Phlebitis   [] Swelling in legs   [x] Varicose veins   [] Non-healing ulcers Pulmonary:   []   Uses home oxygen   [] Productive cough   [] Hemoptysis   [] Wheeze  [] COPD   [] Asthma Neurologic:  [] Dizziness   [] Seizures   [] History of stroke   [] History of TIA  [] Aphasia   [] Vissual changes   [] Weakness or numbness in arm   [] Weakness or numbness in leg Musculoskeletal:   [] Joint swelling   [] Joint pain   [] Low back pain Hematologic:  [] Easy bruising  [] Easy bleeding   [] Hypercoagulable state   [] Anemic Gastrointestinal:  [] Diarrhea   [] Vomiting  [] Gastroesophageal reflux/heartburn   [] Difficulty swallowing. Genitourinary:  [] Chronic kidney disease   [] Difficult urination  [] Frequent urination   [] Blood in urine Skin:  [] Rashes   [] Ulcers  Psychological:  [] History of  anxiety   []  History of major depression.  Physical Examination  There were no vitals filed for this visit. There is no height or weight on file to calculate BMI. Gen: WD/WN, NAD Head: [place]/AT, No temporalis wasting.  Ear/Nose/Throat: Hearing grossly intact, nares w/o erythema or drainage, pinna without lesions Eyes: PER, EOMI, sclera nonicteric.  Neck: Supple, no gross masses.  No JVD.  Pulmonary:  Good air movement, no audible wheezing, no use of accessory muscles.  Cardiac: RRR, precordium not hyperdynamic. Vascular:   Scattered varicosities present, 4 mm bilaterally.  Veins are tender to palpation  Mild venous stasis changes to the legs bilaterally.  Trace soft pitting edema CEAP C3sEpAsPr Vessel Right Left  Radial Palpable Palpable  Gastrointestinal: soft, non-distended. No guarding/no peritoneal signs.  Musculoskeletal: M/S 5/5 throughout.  Diffuse arthritic deformity.  Neurologic: CN 2-12 intact. Pain and light touch intact in extremities.  Symmetrical.  Speech is fluent. Motor exam as listed above. Psychiatric: Judgment intact, Mood & affect appropriate for pt's clinical situation. Dermatologic: Venous rashes no ulcers noted.  No changes consistent with cellulitis. Lymph : No lichenification or skin changes of chronic lymphedema.  CBC Lab Results  Component Value Date   WBC 9.0 07/02/2021   HGB 14.0 07/02/2021   HCT 43.9 07/02/2021   MCV 93.2 07/02/2021   PLT 423 (H) 07/02/2021    BMET    Component Value Date/Time   NA 136 07/02/2021 2306   NA 144 03/07/2017 1321   NA 140 06/15/2014 0902   K 3.7 07/02/2021 2306   K 3.5 06/15/2014 0902   CL 106 07/02/2021 2306   CL 105 06/15/2014 0902   CO2 21 (L) 07/02/2021 2306   CO2 28 06/15/2014 0902   GLUCOSE 232 (H) 07/02/2021 2306   GLUCOSE 98 06/15/2014 0902   BUN 21 (H) 07/02/2021 2306   BUN 15 03/07/2017 1321   BUN 13 06/15/2014 0902   CREATININE 0.74 07/02/2021 2306   CREATININE 0.67 06/15/2014 0902   CALCIUM 8.5  (L) 07/02/2021 2306   CALCIUM 7.9 (L) 06/15/2014 0902   GFRNONAA >60 07/02/2021 2306   GFRNONAA >60 06/15/2014 0902   GFRNONAA >60 10/12/2012 1409   GFRAA >60 09/08/2018 0138   GFRAA >60 06/15/2014 0902   GFRAA >60 10/12/2012 1409   CrCl cannot be calculated (Patient's most recent lab result is older than the maximum 21 days allowed.).  COAG Lab Results  Component Value Date   INR 0.88 12/21/2017    Radiology No results found.   Assessment/Plan 1. Pain in both lower extremities Recommend:  The patient has atypical pain symptoms for vascular disease and on exam I do not find evidence of vascular pathology that would explain the patient's symptoms.  Noninvasive studies do not identify significant vascular problems  I suspect the patient is c/o pseudoclaudication.  There is L2-L3 abnormality on her studies.  Patient should have an evaluation of the LS spine which I defer to the primary service or the Spine service.  The patient should continue walking and begin a more formal exercise program. The patient should continue his antiplatelet therapy and aggressive treatment of the lipid abnormalities.  Patient will follow-up with me on a PRN basis.  2. Varicose veins with inflammation Recommend:  The patient is complaining of varicose veins.    I have had a long discussion with the patient regarding  varicose veins and why they cause symptoms.  Patient will begin wearing graduated compression stockings on a daily basis, beginning first thing in the morning and removing them in the evening. The patient is instructed specifically not to sleep in the stockings.    The patient  will also begin using over-the-counter analgesics such as Motrin 600 mg po TID to help control the symptoms as needed.    In addition, behavioral modification including elevation during the day will be initiated, utilizing a recliner was recommended.  The patient is also instructed to continue exercising such as  walking 4-5 times per week.  At this time the patient wishes to continue conservative therapy and is not interested in more invasive treatments such as laser ablation and sclerotherapy.  The Patient will follow up PRN if the symptoms worsen.  3. Primary hypertension Continue antihypertensive medications as already ordered, these medications have been reviewed and there are no changes at this time.  4. Chronic venous insufficiency Recommend:  The patient is complaining of varicose veins.    I have had a long discussion with the patient regarding  varicose veins and why they cause symptoms.  Patient will begin wearing graduated compression stockings on a daily basis, beginning first thing in the morning and removing them in the evening. The patient is instructed specifically not to sleep in the stockings.    The patient  will also begin using over-the-counter analgesics such as Motrin 600 mg po TID to help control the symptoms as needed.    In addition, behavioral modification including elevation during the day will be initiated, utilizing a recliner was recommended.  The patient is also instructed to continue exercising such as walking 4-5 times per week.  At this time the patient wishes to continue conservative therapy and is not interested in more invasive treatments such as laser ablation and sclerotherapy.  The Patient will follow up PRN if the symptoms worsen.  5. L2-3 and L3-4 intervertebral disc extrusion This is likely to be a significant factor in her leg pain symptoms.  Continue NSAID medications as already ordered, these medications have been reviewed and there are no changes at this time.  Continued activity and therapy was stressed.    Levora Dredge, MD  08/07/2022 4:03 PM

## 2022-08-08 ENCOUNTER — Ambulatory Visit (INDEPENDENT_AMBULATORY_CARE_PROVIDER_SITE_OTHER): Payer: Medicare Other | Admitting: Vascular Surgery

## 2022-08-08 ENCOUNTER — Ambulatory Visit (INDEPENDENT_AMBULATORY_CARE_PROVIDER_SITE_OTHER): Payer: Medicare Other

## 2022-08-08 ENCOUNTER — Encounter (INDEPENDENT_AMBULATORY_CARE_PROVIDER_SITE_OTHER): Payer: Self-pay | Admitting: Vascular Surgery

## 2022-08-08 VITALS — BP 147/71 | HR 80 | Resp 16 | Ht 68.0 in | Wt 129.0 lb

## 2022-08-08 DIAGNOSIS — I10 Essential (primary) hypertension: Secondary | ICD-10-CM | POA: Diagnosis not present

## 2022-08-08 DIAGNOSIS — M79604 Pain in right leg: Secondary | ICD-10-CM

## 2022-08-08 DIAGNOSIS — M5126 Other intervertebral disc displacement, lumbar region: Secondary | ICD-10-CM

## 2022-08-08 DIAGNOSIS — I8310 Varicose veins of unspecified lower extremity with inflammation: Secondary | ICD-10-CM | POA: Diagnosis not present

## 2022-08-08 DIAGNOSIS — R600 Localized edema: Secondary | ICD-10-CM

## 2022-08-08 DIAGNOSIS — I872 Venous insufficiency (chronic) (peripheral): Secondary | ICD-10-CM

## 2022-08-08 DIAGNOSIS — M79605 Pain in left leg: Secondary | ICD-10-CM

## 2022-08-14 ENCOUNTER — Encounter (INDEPENDENT_AMBULATORY_CARE_PROVIDER_SITE_OTHER): Payer: Self-pay | Admitting: Vascular Surgery

## 2022-08-14 DIAGNOSIS — M79606 Pain in leg, unspecified: Secondary | ICD-10-CM | POA: Insufficient documentation

## 2022-12-26 ENCOUNTER — Inpatient Hospital Stay: Payer: Medicare Other

## 2022-12-26 ENCOUNTER — Inpatient Hospital Stay: Payer: Medicare Other | Attending: Oncology | Admitting: Oncology

## 2022-12-26 ENCOUNTER — Encounter: Payer: Self-pay | Admitting: Oncology

## 2023-01-07 ENCOUNTER — Emergency Department
Admission: EM | Admit: 2023-01-07 | Discharge: 2023-01-07 | Discharge status: Against Medical Advice | Payer: Medicare Other | Attending: Emergency Medicine | Admitting: Emergency Medicine

## 2023-01-07 ENCOUNTER — Other Ambulatory Visit: Payer: Self-pay

## 2023-01-07 ENCOUNTER — Emergency Department: Payer: Medicare Other

## 2023-01-07 DIAGNOSIS — Z5321 Procedure and treatment not carried out due to patient leaving prior to being seen by health care provider: Secondary | ICD-10-CM | POA: Insufficient documentation

## 2023-01-07 DIAGNOSIS — F17210 Nicotine dependence, cigarettes, uncomplicated: Secondary | ICD-10-CM | POA: Diagnosis not present

## 2023-01-07 DIAGNOSIS — R0602 Shortness of breath: Secondary | ICD-10-CM | POA: Insufficient documentation

## 2023-01-07 DIAGNOSIS — J449 Chronic obstructive pulmonary disease, unspecified: Secondary | ICD-10-CM | POA: Diagnosis not present

## 2023-01-07 LAB — BASIC METABOLIC PANEL
Anion gap: 9 (ref 5–15)
BUN: 22 mg/dL (ref 8–23)
CO2: 25 mmol/L (ref 22–32)
Calcium: 8.6 mg/dL — ABNORMAL LOW (ref 8.9–10.3)
Chloride: 103 mmol/L (ref 98–111)
Creatinine, Ser: 0.75 mg/dL (ref 0.44–1.00)
GFR, Estimated: >60 mL/min (ref >60)
Glucose, Bld: 116 mg/dL — ABNORMAL HIGH (ref 70–99)
Potassium: 3.2 mmol/L — ABNORMAL LOW (ref 3.5–5.1)
Sodium: 137 mmol/L (ref 135–145)

## 2023-01-07 LAB — CBC
HCT: 39 % (ref 36.0–46.0)
Hemoglobin: 12.7 g/dL (ref 12.0–15.0)
MCH: 30 pg (ref 26.0–34.0)
MCHC: 32.6 g/dL (ref 30.0–36.0)
MCV: 92 fL (ref 80.0–100.0)
Platelets: 394 10*3/uL (ref 150–400)
RBC: 4.24 MIL/uL (ref 3.87–5.11)
RDW: 13.2 % (ref 11.5–15.5)
WBC: 12.1 10*3/uL — ABNORMAL HIGH (ref 4.0–10.5)
nRBC: 0 % (ref 0.0–0.2)

## 2023-01-07 NOTE — ED Triage Notes (Addendum)
Pt reports was dx'd with covid las Saturday. Pt reports also has COPD and is concerned about her breathing. Pt reports she feels more SOB since being dx with covid. Pt admits to still smoking cigarettes as well. Pt reports was prescribed a prednisone taper 2 days ago when she was seen at the [facility] clinic but didn't start it until yesterday and has not taken it today because she is not sure if she should or not

## 2023-01-07 NOTE — ED Notes (Addendum)
First Nurse Note;  Pt states she does not want to wait and see the doctor.

## 2023-02-17 ENCOUNTER — Other Ambulatory Visit: Payer: Self-pay | Admitting: Nurse Practitioner

## 2023-02-17 DIAGNOSIS — Z1231 Encounter for screening mammogram for malignant neoplasm of breast: Secondary | ICD-10-CM

## 2023-02-20 ENCOUNTER — Other Ambulatory Visit: Payer: Self-pay | Admitting: Nurse Practitioner

## 2023-02-20 DIAGNOSIS — Z780 Asymptomatic menopausal state: Secondary | ICD-10-CM

## 2023-03-01 ENCOUNTER — Ambulatory Visit
Admission: RE | Admit: 2023-03-01 | Discharge: 2023-03-01 | Disposition: A | Discharge status: Home / Self Care | Payer: Medicare Other | Source: Ambulatory Visit | Attending: Nurse Practitioner | Admitting: Nurse Practitioner

## 2023-03-01 DIAGNOSIS — Z1231 Encounter for screening mammogram for malignant neoplasm of breast: Secondary | ICD-10-CM | POA: Diagnosis present

## 2023-03-17 ENCOUNTER — Inpatient Hospital Stay: Payer: Medicare Other | Admitting: Oncology

## 2023-03-17 ENCOUNTER — Inpatient Hospital Stay: Payer: Medicare Other

## 2023-03-20 ENCOUNTER — Inpatient Hospital Stay: Payer: Medicare Other | Admitting: Oncology

## 2023-03-20 ENCOUNTER — Encounter: Payer: Self-pay | Admitting: Oncology

## 2023-03-20 ENCOUNTER — Inpatient Hospital Stay: Payer: Medicare Other

## 2023-05-03 ENCOUNTER — Other Ambulatory Visit: Payer: Medicare Other

## 2023-09-08 ENCOUNTER — Other Ambulatory Visit: Payer: Self-pay

## 2023-09-08 ENCOUNTER — Emergency Department
Admission: EM | Admit: 2023-09-08 | Discharge: 2023-09-08 | Disposition: A | Discharge status: Home / Self Care | Payer: Medicare Other | Attending: Emergency Medicine | Admitting: Emergency Medicine

## 2023-09-08 DIAGNOSIS — L03116 Cellulitis of left lower limb: Secondary | ICD-10-CM | POA: Insufficient documentation

## 2023-09-08 DIAGNOSIS — M79662 Pain in left lower leg: Secondary | ICD-10-CM | POA: Diagnosis present

## 2023-09-08 LAB — BASIC METABOLIC PANEL
Anion gap: 8 (ref 5–15)
BUN: 15 mg/dL (ref 8–23)
CO2: 25 mmol/L (ref 22–32)
Calcium: 8.3 mg/dL — ABNORMAL LOW (ref 8.9–10.3)
Chloride: 105 mmol/L (ref 98–111)
Creatinine, Ser: 0.73 mg/dL (ref 0.44–1.00)
GFR, Estimated: >60 mL/min (ref >60)
Glucose, Bld: 165 mg/dL — ABNORMAL HIGH (ref 70–99)
Potassium: 3.9 mmol/L (ref 3.5–5.1)
Sodium: 138 mmol/L (ref 135–145)

## 2023-09-08 LAB — CBC
HCT: 37.9 % (ref 36.0–46.0)
Hemoglobin: 12.1 g/dL (ref 12.0–15.0)
MCH: 30.3 pg (ref 26.0–34.0)
MCHC: 31.9 g/dL (ref 30.0–36.0)
MCV: 95 fL (ref 80.0–100.0)
Platelets: 317 10*3/uL (ref 150–400)
RBC: 3.99 MIL/uL (ref 3.87–5.11)
RDW: 13.5 % (ref 11.5–15.5)
WBC: 9.7 10*3/uL (ref 4.0–10.5)
nRBC: 0 % (ref 0.0–0.2)

## 2023-09-08 MED ORDER — CEPHALEXIN 500 MG PO CAPS: 500.0000 mg | ORAL_CAPSULE | Freq: Three times a day (TID) | ORAL | 0 refills | Status: AC

## 2023-09-08 MED ORDER — DOXYCYCLINE HYCLATE 100 MG PO TABS: 100.0000 mg | ORAL_TABLET | Freq: Two times a day (BID) | ORAL | 0 refills | Status: AC

## 2023-09-08 MED ORDER — DOXYCYCLINE HYCLATE 100 MG PO TABS
100.0000 mg | ORAL_TABLET | Freq: Once | ORAL | Status: AC
  Administered 2023-09-08: 100 mg via ORAL
  Filled 2023-09-08: qty 1

## 2023-09-08 MED ORDER — CEPHALEXIN 500 MG PO CAPS
500.0000 mg | ORAL_CAPSULE | Freq: Once | ORAL | Status: AC
  Administered 2023-09-08: 500 mg via ORAL
  Filled 2023-09-08: qty 1

## 2023-09-08 NOTE — ED Triage Notes (Signed)
 Pt to ED For wound check to left lower leg x3 days after hitting it on something. Reports is more swollen that normal. Redness noted to lower left leg. +DM
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
## 2023-09-08 NOTE — Discharge Instructions (Addendum)
 I suspect this is mostly related to a cellulitis but this is not getting better you may need an ultrasound to rule out any type of blood clot.  We are starting you on antibiotics to help fight the infection.  Return to the ER if you develop worsening symptoms or any other concerns

## 2023-10-19 ENCOUNTER — Other Ambulatory Visit: Payer: Medicare Other

## 2023-10-27 ENCOUNTER — Ambulatory Visit
Admission: RE | Admit: 2023-10-27 | Discharge: 2023-10-27 | Disposition: A | Discharge status: Home / Self Care | Payer: Medicare Other | Source: Ambulatory Visit | Attending: Nurse Practitioner | Admitting: Nurse Practitioner

## 2023-10-27 DIAGNOSIS — Z780 Asymptomatic menopausal state: Secondary | ICD-10-CM | POA: Diagnosis present

## 2023-12-11 ENCOUNTER — Inpatient Hospital Stay: Attending: Oncology | Admitting: Oncology

## 2023-12-11 ENCOUNTER — Inpatient Hospital Stay

## 2023-12-11 ENCOUNTER — Encounter: Payer: Self-pay | Admitting: Oncology

## 2023-12-11 VITALS — BP 141/85 | HR 73 | Temp 97.0°F | Resp 18 | Ht 68.0 in | Wt 134.2 lb

## 2023-12-11 DIAGNOSIS — Z79899 Other long term (current) drug therapy: Secondary | ICD-10-CM | POA: Diagnosis not present

## 2023-12-11 DIAGNOSIS — Z8739 Personal history of other diseases of the musculoskeletal system and connective tissue: Secondary | ICD-10-CM

## 2023-12-11 DIAGNOSIS — M069 Rheumatoid arthritis, unspecified: Secondary | ICD-10-CM | POA: Insufficient documentation

## 2023-12-11 DIAGNOSIS — E611 Iron deficiency: Secondary | ICD-10-CM | POA: Insufficient documentation

## 2023-12-11 DIAGNOSIS — Z8669 Personal history of other diseases of the nervous system and sense organs: Secondary | ICD-10-CM

## 2023-12-11 DIAGNOSIS — G2581 Restless legs syndrome: Secondary | ICD-10-CM | POA: Insufficient documentation

## 2023-12-11 DIAGNOSIS — F17210 Nicotine dependence, cigarettes, uncomplicated: Secondary | ICD-10-CM | POA: Insufficient documentation

## 2023-12-11 LAB — CBC WITH DIFFERENTIAL/PLATELET
Abs Immature Granulocytes: 0.07 10*3/uL (ref 0.00–0.07)
Basophils Absolute: 0.1 10*3/uL (ref 0.0–0.1)
Basophils Relative: 1 %
Eosinophils Absolute: 0.3 10*3/uL (ref 0.0–0.5)
Eosinophils Relative: 3 %
HCT: 40 % (ref 36.0–46.0)
Hemoglobin: 12.7 g/dL (ref 12.0–15.0)
Immature Granulocytes: 1 %
Lymphocytes Relative: 30 %
Lymphs Abs: 3 10*3/uL (ref 0.7–4.0)
MCH: 29.5 pg (ref 26.0–34.0)
MCHC: 31.8 g/dL (ref 30.0–36.0)
MCV: 92.8 fL (ref 80.0–100.0)
Monocytes Absolute: 0.6 10*3/uL (ref 0.1–1.0)
Monocytes Relative: 6 %
Neutro Abs: 6.1 10*3/uL (ref 1.7–7.7)
Neutrophils Relative %: 59 %
Platelets: 313 10*3/uL (ref 150–400)
RBC: 4.31 MIL/uL (ref 3.87–5.11)
RDW: 13.4 % (ref 11.5–15.5)
WBC: 10.2 10*3/uL (ref 4.0–10.5)
nRBC: 0 % (ref 0.0–0.2)

## 2023-12-11 LAB — IRON AND TIBC
Iron: 52 ug/dL (ref 28–170)
Saturation Ratios: 19 % (ref 10.4–31.8)
TIBC: 277 ug/dL (ref 250–450)
UIBC: 225 ug/dL

## 2023-12-11 LAB — FERRITIN: Ferritin: 21 ng/mL (ref 11–307)

## 2023-12-11 LAB — VITAMIN B12: Vitamin B-12: 319 pg/mL (ref 180–914)

## 2023-12-11 LAB — FOLATE: Folate: 11.9 ng/mL (ref >5.9)

## 2023-12-11 NOTE — Progress Notes (Signed)
 Hematology/Oncology Consult note [facility] Telephone:([phone] [phone]  Patient Care Team: [facility] as PCP - General Hermenia Fiscal, Kathlynn Grate, NP as Nurse Practitioner (Family Medicine) Kandyce Rud., MD (Rheumatology)   Name of the patient: Karen Chen  [id number]  28-Mar-1961    Reason for referral-history of restless leg syndrome   Referring physician-Lars Carmela Hurt, NP  Date of visit: 12/11/23   History of presenting illness- patient is a 64 year old female with a past medical history significant for hypertension type 2 diabetes rheumatoid arthritis who has been referred for anemia.  Labs from 11/30/2023 which showed a normal CMP.  Hemoglobin A1c 8.5%.  HIV testing negative.  I do not see any CBC or iron studies checked at that time.  Patient has symptoms of restless legs at night.Last CBC from 09/08/2023 showed an H&H of 12.1/37.9 with an MCV of 95 with a normal white count and platelet count.  Patient has been referred to hematology for evaluation of iron deficiency to possibly treat her restless leg syndrome.  Patient is currently on Suboxone, gabapentin and recommended for restless legs and the symptoms continue to bother her.  She has history of rheumatoid arthritis for which she is on prednisone.  She has been unable to tolerate other medications  ECOG PS- 1  Pain scale- 3   Review of systems- Review of Systems  Constitutional:  Positive for malaise/fatigue. Negative for chills, fever and weight loss.  HENT:  Negative for congestion, ear discharge and nosebleeds.   Eyes:  Negative for blurred vision.  Respiratory:  Negative for cough, hemoptysis, sputum production, shortness of breath and wheezing.   Cardiovascular:  Negative for chest pain, palpitations, orthopnea and claudication.  Gastrointestinal:  Negative for abdominal pain, blood in stool, constipation, diarrhea, heartburn, melena, nausea and vomiting.   Genitourinary:  Negative for dysuria, flank pain, frequency, hematuria and urgency.  Musculoskeletal:  Positive for joint pain. Negative for back pain and myalgias.  Skin:  Negative for rash.  Neurological:  Negative for dizziness, tingling, focal weakness, seizures, weakness and headaches.  Endo/Heme/Allergies:  Does not bruise/bleed easily.  Psychiatric/Behavioral:  Negative for depression and suicidal ideas. The patient does not have insomnia.     Allergies  Allergen Reactions   Haldol [Haloperidol Lactate]    Orencia [Abatacept] Anaphylaxis   Sulfa Antibiotics Anaphylaxis   Azathioprine Other (See Comments)   Indocin [Indomethacin]    Levaquin [Levofloxacin In D5w]    Methotrexate Derivatives    Prozac [Fluoxetine Hcl]    Remeron [Mirtazapine]    Remicade [Infliximab] Hives   Robaxin [Methocarbamol]    Tizanidine    Zanaflex [Tizanidine Hcl] Other (See Comments)    Patient states a burning sensation in her mouth 30 minutes after taking.   Adalimumab Itching and Rash    Patient Active Problem List   Diagnosis Date Noted   Leg pain 08/14/2022   Varicose veins with inflammation 08/07/2022   Long term current use of systemic steroids 05/10/2022   Rheumatoid arthritis involving both hands (HCC) 11/30/2021   Varicose veins of both legs with edema 11/30/2021   Chronic venous insufficiency 03/16/2019   Tremor of right hand 10/29/2018   Encephalopathy 12/21/2017   Myalgia 08/23/2017   Tremor, unspecified 08/23/2017   Insomnia due to other mental disorder 08/23/2017   Hypotension, unspecified 08/23/2017   Major depressive disorder, recurrent, severe with psychotic symptoms (HCC) 08/23/2017   Wheezing 08/23/2017   Tinea pedis 08/23/2017   Mild intermittent asthma,  uncomplicated 08/23/2017   Primary generalized (osteo)arthritis 08/23/2017   Nausea 08/23/2017   Peripheral vascular disease (HCC) 08/23/2017   Generalized anxiety disorder 08/23/2017   Nicotine dependence,  cigarettes, uncomplicated 08/23/2017   Lower extremity numbness 05/08/2017   Polyneuropathy 05/03/2017   Radiculopathy of lumbar region 05/03/2017   Hypokalemia 04/24/2017   Vitamin D insufficiency 04/24/2017   Failed back surgical syndrome 03/09/2017   Grade II Anterolisthesis of L4 over L5 03/09/2017   Epidural fibrosis 03/09/2017   Chronic L4 bilateral pars defect 03/09/2017   L2-3 and L3-4 intervertebral disc extrusion 03/09/2017   Abnormal MRI, lumbar spine (09/28/2016) 03/09/2017   Lumbar facet hypertrophy 03/09/2017   Lumbar facet syndrome (Bilateral) (L>R) 03/09/2017   Lumbar L3-4 lateral recess stenosis (Bilateral) (L>R) 03/09/2017   Chronic pain syndrome 03/07/2017   Long-term use of high-risk medication 03/07/2017   Long term prescription benzodiazepine use 03/07/2017   Chronic low back pain (Primary Source of Pain) (Bilateral) (L>R) 03/07/2017   Chronic lower extremity pain (Secondary source of pain) (Bilateral) (L>R) 03/07/2017   Chronic hand pain [facility] source of pain) (Bilateral) (R>L) 03/07/2017   Chronic neck pain (Posterior) (Bilateral) (R>L) 03/07/2017   Chronic shoulder pain (Bilateral) (R>L) 03/07/2017   Chronic foot pain (Bilateral) (R>L) 03/07/2017   TIA (transient ischemic attack) 09/16/2016   CVA (cerebral vascular accident) (HCC) 09/15/2016   Rheumatoid arthritis involving multiple sites with positive rheumatoid factor (HCC) 05/04/2016   ADHD (attention deficit hyperactivity disorder) 03/25/2015   Bite from insect 03/25/2015   PTSD (post-traumatic stress disorder) 03/25/2015   Rheumatoid arthritis with rheumatoid factor (HCC) 06/18/2014   Anxiety 03/26/2014   Hypertension 03/26/2014   Restless leg 09/20/2013     Past Medical History:  Diagnosis Date   Anxiety    Chronic lower back pain    Chronic pain syndrome    uses Suboxone   Diabetes mellitus without complication (HCC)    Hypertension    Restless leg    Rheumatoid arthritis (HCC)       Past Surgical History:  Procedure Laterality Date   BACK SURGERY  1980    Social History   Socioeconomic History   Marital status: Married    Spouse name: Not on file   Number of children: Not on file   Years of education: Not on file   Highest education level: Not on file  Occupational History   Not on file  Tobacco Use   Smoking status: Every Day    Current packs/day: 0.25    Average packs/day: 0.3 packs/day for 35.0 years (8.8 ttl pk-yrs)    Types: Cigarettes   Smokeless tobacco: Never  Vaping Use   Vaping status: Former  Substance and Sexual Activity   Alcohol use: No   Drug use: No   Sexual activity: Never  Other Topics Concern   Not on file  Social History Narrative   Not on file   Social Drivers of Health   Financial Resource Strain: Not on file  Food Insecurity: Not on file  Transportation Needs: Not on file  Physical Activity: Not on file  Stress: Not on file  Social Connections: Not on file  Intimate Partner Violence: Not on file     Family History  Problem Relation Age of Onset   Breast cancer Mother 89   Heart failure Father    Hypertension Father    Diabetes Sister      Current Outpatient Medications:    albuterol (VENTOLIN HFA) 108 (90 Base) MCG/ACT inhaler, Inhale  2-4 puffs by mouth every 4 hours as needed for wheezing, cough, and/or shortness of breath, Disp: 6.7 g, Rfl: 0   amoxicillin-clavulanate (AUGMENTIN) 875-125 MG tablet, Take 1 tablet by mouth every 12 (twelve) hours. (Patient not taking: Reported on 09/08/2018), Disp: 12 tablet, Rfl: 0   aspirin 81 MG tablet, Take 81 mg by mouth daily., Disp: , Rfl:    Buprenorphine HCl-Naloxone HCl 2-0.5 MG FILM, , Disp: , Rfl:    Calcium Carb-Cholecalciferol (CALCIUM PLUS D3 ABSORBABLE) [phone] MG-UNIT CAPS, Take 1 capsule by mouth daily with breakfast. (Patient not taking: Reported on 03/04/2019), Disp: 90 capsule, Rfl: 0   chlorpheniramine-HYDROcodone (TUSSIONEX PENNKINETIC ER) 10-8 MG/5ML  SUER, Take 5 mLs by mouth every 12 (twelve) hours as needed for cough., Disp: 115 mL, Rfl: 0   escitalopram (LEXAPRO) 20 MG tablet, 2 tabs po daily, Disp: 30 tablet, Rfl: 0   gabapentin (NEURONTIN) 300 MG capsule, Take 2 capsules (600 mg total) by mouth 2 (two) times daily., Disp: , Rfl:    hydrochlorothiazide (HYDRODIURIL) 25 MG tablet, Take 1 tablet (25 mg total) by mouth daily., Disp: 30 tablet, Rfl: 0   Multiple Vitamin (MULTIVITAMIN WITH MINERALS) TABS tablet, Take 1 tablet by mouth daily. (Patient not taking: Reported on 03/04/2019), Disp: 30 tablet, Rfl: 0   ondansetron (ZOFRAN ODT) 4 MG disintegrating tablet, Take 1 tablet (4 mg total) by mouth every 8 (eight) hours as needed for nausea or vomiting. (Patient not taking: Reported on 03/04/2019), Disp: 20 tablet, Rfl: 0   oxyCODONE-acetaminophen (PERCOCET/ROXICET) 5-325 MG tablet, Take 1 tablet by mouth every 8 (eight) hours as needed. , Disp: , Rfl:    potassium chloride (K-DUR) 10 MEQ tablet, Take 2 tablets (20 mEq total) by mouth daily. Take two tablets., Disp: 180 tablet, Rfl: 1   predniSONE (DELTASONE) 20 MG tablet, Take 3 tablets (60 mg total) by mouth daily., Disp: 15 tablet, Rfl: 0   rOPINIRole (REQUIP) 1 MG tablet, Take 1 tab po tid for restless leg syndrome, Disp: 90 tablet, Rfl: 0   Physical exam: There were no vitals filed for this visit. Physical Exam Cardiovascular:     Rate and Rhythm: Normal rate and regular rhythm.     Heart sounds: Normal heart sounds.  Pulmonary:     Effort: Pulmonary effort is normal.     Breath sounds: Normal breath sounds.  Abdominal:     General: Bowel sounds are normal.     Palpations: Abdomen is soft.  Musculoskeletal:     Comments: Chronic joint deformities noted in bilateral hands and feet due to rheumatoid arthritis.  Trace bilateral edema  Skin:    General: Skin is warm and dry.  Neurological:     Mental Status: She is alert and oriented to person, place, and time.           Latest  Ref Rng & Units 09/08/2023    2:56 PM  CMP  Glucose 70 - 99 mg/dL 540   BUN 8 - 23 mg/dL 15   Creatinine 0.86 - 1.00 mg/dL 7.61   Sodium 950 - 932 mmol/L 138   Potassium 3.5 - 5.1 mmol/L 3.9   Chloride 98 - 111 mmol/L 105   CO2 22 - 32 mmol/L 25   Calcium 8.9 - 10.3 mg/dL 8.3       Latest Ref Rng & Units 09/08/2023    2:56 PM  CBC  WBC 4.0 - 10.5 K/uL 9.7   Hemoglobin 12.0 - 15.0 g/dL 12.1  Hematocrit 36.0 - 46.0 % 37.9   Platelets 150 - 400 K/uL 317      Assessment and plan- Patient is a 63 y.o. female referred for evaluation of iron deficiency in the setting of restless leg syndrome  Patient is not anemic based on her labs in January 2025.  No recent iron studies have been checked.  Discussed with the patient that ferritin levels less than 75 or symptoms of iron deficiency could be related to restless leg.  We will check CBC ferritin and iron studies and B12 levels today.  If ferritin levels are less than 75 or labs indicated of iron deficiency we will plan to give her IV iron.  Discussed acetaminophens of IV iron including all but not limited to possible risk of infusion anaphylactic reaction.  Given her history of rheumatoid arthritis and multiple allergies to medications I will offer her Solu-Medrol as a premedication for IV iron if need be.  I will see her back in 2 weeks time to discuss the results of blood work   Thank you for this kind referral and the opportunity to participate in the care of this patient   Visit Diagnosis 1. History of restless legs syndrome   2. History of rheumatoid arthritis     Dr. Seretha Dance, MD, MPH [facility] [phone] 12/11/2023

## 2023-12-19 ENCOUNTER — Encounter: Payer: Self-pay | Admitting: Oncology

## 2023-12-19 ENCOUNTER — Inpatient Hospital Stay: Admitting: Oncology

## 2023-12-19 ENCOUNTER — Telehealth: Payer: Self-pay | Admitting: *Deleted

## 2023-12-19 NOTE — Telephone Encounter (Signed)
 Patient called to get labs to see about her iron check. I do not see that there is a telephone note about this and I can send to the team to see about this. I called her back and she has voicemail that is full and can't leave a message

## 2023-12-20 NOTE — Telephone Encounter (Signed)
 She would qualify for IV iron. She was willing to take it when I saw her. Megan please schedule asap and call her

## 2023-12-28 ENCOUNTER — Other Ambulatory Visit: Payer: Self-pay | Admitting: Oncology

## 2023-12-28 ENCOUNTER — Telehealth: Payer: Self-pay

## 2023-12-28 DIAGNOSIS — D509 Iron deficiency anemia, unspecified: Secondary | ICD-10-CM | POA: Insufficient documentation

## 2023-12-28 DIAGNOSIS — D508 Other iron deficiency anemias: Secondary | ICD-10-CM

## 2023-12-28 NOTE — Telephone Encounter (Signed)
 Called patient to inform her that Dr. Randy Buttery said she qualifies for IV Iron. Patient did verbally agree to get scheduled for an infusion, but expressed concerns that she is worried she will have some type of reaction to the infusion due to a previous medical issue she encountered awhile back.  I advise patient that I will address her concerns with Dr. Randy Buttery and that she would be receiving a call back shortly from our scheduling team to get her scheduled for the infusion.

## 2024-01-04 ENCOUNTER — Inpatient Hospital Stay: Attending: Oncology

## 2024-01-04 VITALS — BP 136/61 | HR 88 | Temp 97.4°F | Resp 18

## 2024-01-04 DIAGNOSIS — D508 Other iron deficiency anemias: Secondary | ICD-10-CM

## 2024-01-04 DIAGNOSIS — E611 Iron deficiency: Secondary | ICD-10-CM | POA: Diagnosis not present

## 2024-01-04 MED ORDER — SODIUM CHLORIDE 0.9 % IV SOLN
INTRAVENOUS | Status: DC
  Filled 2024-01-04: qty 250

## 2024-01-04 MED ORDER — IRON SUCROSE 20 MG/ML IV SOLN
200.0000 mg | INTRAVENOUS | Status: DC
  Administered 2024-01-04: 200 mg via INTRAVENOUS
  Filled 2024-01-04: qty 10

## 2024-01-04 MED ORDER — FAMOTIDINE IN NACL 20-0.9 MG/50ML-% IV SOLN
20.0000 mg | INTRAVENOUS | Status: DC
  Administered 2024-01-04: 20 mg via INTRAVENOUS
  Filled 2024-01-04: qty 50

## 2024-01-04 MED ORDER — METHYLPREDNISOLONE SODIUM SUCC 125 MG IJ SOLR
40.0000 mg | INTRAMUSCULAR | Status: DC
  Administered 2024-01-04: 40 mg via INTRAVENOUS
  Filled 2024-01-04: qty 2

## 2024-01-11 ENCOUNTER — Inpatient Hospital Stay

## 2024-01-11 ENCOUNTER — Other Ambulatory Visit: Payer: Self-pay | Admitting: Oncology

## 2024-01-11 VITALS — BP 105/59 | HR 74 | Temp 97.8°F | Resp 18

## 2024-01-11 DIAGNOSIS — E611 Iron deficiency: Secondary | ICD-10-CM | POA: Diagnosis not present

## 2024-01-11 DIAGNOSIS — D508 Other iron deficiency anemias: Secondary | ICD-10-CM

## 2024-01-11 MED ORDER — IRON SUCROSE 20 MG/ML IV SOLN
200.0000 mg | INTRAVENOUS | Status: DC
  Administered 2024-01-11: 200 mg via INTRAVENOUS
  Filled 2024-01-11: qty 10

## 2024-01-11 MED ORDER — PREDNISONE 20 MG PO TABS: 20.0000 mg | ORAL_TABLET | Freq: Every day | ORAL | 0 refills | Status: AC

## 2024-01-11 MED ORDER — METHYLPREDNISOLONE SODIUM SUCC 40 MG IJ SOLR
20.0000 mg | Freq: Once | INTRAMUSCULAR | Status: AC
  Administered 2024-01-11: 20 mg via INTRAVENOUS
  Filled 2024-01-11: qty 1

## 2024-01-11 MED ORDER — FAMOTIDINE IN NACL 20-0.9 MG/50ML-% IV SOLN
20.0000 mg | INTRAVENOUS | Status: DC
  Administered 2024-01-11: 20 mg via INTRAVENOUS

## 2024-01-11 NOTE — Progress Notes (Signed)
 Patient here for second dose of venofer . Per pt after first infusion she was unable to sleep any that night and was restless. Randy Buttery, MD notified. IV solumedrol decreased from 40 mg to 20 mg. Randy Buttery, MD also called in a prescription for 10 mg prednisone . Pt educated to take the prednisone  an hour before her infusion appointments to hopefully help with the restlessness. Pt verified understanding. Pt stable at discharge.

## 2024-01-18 ENCOUNTER — Inpatient Hospital Stay

## 2024-01-18 VITALS — BP 128/53 | HR 69 | Temp 97.3°F | Resp 18

## 2024-01-18 DIAGNOSIS — D508 Other iron deficiency anemias: Secondary | ICD-10-CM

## 2024-01-18 DIAGNOSIS — E611 Iron deficiency: Secondary | ICD-10-CM | POA: Diagnosis not present

## 2024-01-18 MED ORDER — IRON SUCROSE 20 MG/ML IV SOLN
200.0000 mg | INTRAVENOUS | Status: DC
  Administered 2024-01-18: 200 mg via INTRAVENOUS
  Filled 2024-01-18: qty 10

## 2024-01-18 MED ORDER — SODIUM CHLORIDE 0.9 % IV SOLN
INTRAVENOUS | Status: DC
  Filled 2024-01-18: qty 250

## 2024-01-18 MED ORDER — METHYLPREDNISOLONE SODIUM SUCC 125 MG IJ SOLR: 40.0000 mg | INTRAMUSCULAR | Status: DC

## 2024-01-18 MED ORDER — FAMOTIDINE IN NACL 20-0.9 MG/50ML-% IV SOLN
20.0000 mg | INTRAVENOUS | Status: DC
  Administered 2024-01-18: 20 mg via INTRAVENOUS
  Filled 2024-01-18: qty 50

## 2024-01-18 NOTE — Progress Notes (Signed)
 Patient takes prednisone  at home and only wants to take Pepcid .

## 2024-01-25 ENCOUNTER — Inpatient Hospital Stay

## 2024-01-25 VITALS — BP 146/71 | HR 69 | Temp 98.0°F | Resp 18

## 2024-01-25 DIAGNOSIS — D508 Other iron deficiency anemias: Secondary | ICD-10-CM

## 2024-01-25 DIAGNOSIS — E611 Iron deficiency: Secondary | ICD-10-CM | POA: Diagnosis not present

## 2024-01-25 MED ORDER — METHYLPREDNISOLONE SODIUM SUCC 125 MG IJ SOLR: 40.0000 mg | INTRAMUSCULAR | Status: DC

## 2024-01-25 MED ORDER — IRON SUCROSE 20 MG/ML IV SOLN
200.0000 mg | INTRAVENOUS | Status: DC
  Administered 2024-01-25: 200 mg via INTRAVENOUS
  Filled 2024-01-25: qty 10

## 2024-01-25 MED ORDER — FAMOTIDINE IN NACL 20-0.9 MG/50ML-% IV SOLN
20.0000 mg | INTRAVENOUS | Status: DC
  Administered 2024-01-25: 20 mg via INTRAVENOUS

## 2024-01-25 MED ORDER — SODIUM CHLORIDE 0.9 % IV SOLN
INTRAVENOUS | Status: DC | PRN
  Filled 2024-01-25: qty 250

## 2024-01-25 NOTE — Progress Notes (Signed)
 Patient was given Pepcid  IV as a pre med for her iron . She called me over 20 minutes after the Pepcid  was done and said she does not wait the full 30 minutes after pre-med, and wanted the iron  to be given now.  Iron  was given at 20 minutes post Pepcid  infusion, at the patient's request. Patient declined to wait 30 minutes for post iron  infusion observation today. Tolerated infusion well. VSS.

## 2024-02-01 ENCOUNTER — Inpatient Hospital Stay: Attending: Oncology

## 2024-02-01 VITALS — BP 152/73 | HR 74 | Temp 97.2°F | Resp 18

## 2024-02-01 DIAGNOSIS — E611 Iron deficiency: Secondary | ICD-10-CM | POA: Insufficient documentation

## 2024-02-01 DIAGNOSIS — D508 Other iron deficiency anemias: Secondary | ICD-10-CM

## 2024-02-01 MED ORDER — SODIUM CHLORIDE 0.9 % IV SOLN
Freq: Once | INTRAVENOUS | Status: AC
  Filled 2024-02-01: qty 250

## 2024-02-01 MED ORDER — METHYLPREDNISOLONE SODIUM SUCC 125 MG IJ SOLR: 40.0000 mg | INTRAMUSCULAR | Status: DC

## 2024-02-01 MED ORDER — FAMOTIDINE IN NACL 20-0.9 MG/50ML-% IV SOLN
20.0000 mg | INTRAVENOUS | Status: DC
  Administered 2024-02-01: 20 mg via INTRAVENOUS
  Filled 2024-02-01: qty 50

## 2024-02-01 MED ORDER — IRON SUCROSE 20 MG/ML IV SOLN
200.0000 mg | INTRAVENOUS | Status: DC
  Administered 2024-02-01: 200 mg via INTRAVENOUS
  Filled 2024-02-01: qty 10

## 2024-04-05 ENCOUNTER — Other Ambulatory Visit: Payer: Self-pay | Admitting: *Deleted

## 2024-04-05 DIAGNOSIS — D508 Other iron deficiency anemias: Secondary | ICD-10-CM

## 2024-04-08 ENCOUNTER — Inpatient Hospital Stay: Attending: Oncology

## 2024-04-10 ENCOUNTER — Inpatient Hospital Stay: Admitting: Oncology

## 2024-04-12 ENCOUNTER — Encounter: Payer: Self-pay | Admitting: Oncology

## 2024-04-12 ENCOUNTER — Inpatient Hospital Stay: Admitting: Oncology

## 2024-06-28 ENCOUNTER — Encounter: Payer: Self-pay | Admitting: Oncology

## 2024-07-12 ENCOUNTER — Encounter: Payer: Self-pay | Admitting: *Deleted

## 2024-07-30 ENCOUNTER — Telehealth: Payer: Self-pay | Admitting: Acute Care

## 2024-07-30 DIAGNOSIS — F17210 Nicotine dependence, cigarettes, uncomplicated: Secondary | ICD-10-CM

## 2024-07-30 DIAGNOSIS — Z122 Encounter for screening for malignant neoplasm of respiratory organs: Secondary | ICD-10-CM

## 2024-07-30 DIAGNOSIS — Z87891 Personal history of nicotine dependence: Secondary | ICD-10-CM

## 2024-07-30 NOTE — Telephone Encounter (Signed)
 Lung Cancer Screening Narrative/Criteria Questionnaire (Cigarette Smokers Only- No Cigars/Pipes/vapes)   Karen Chen [facility]   SDMV:08/09/24@1030 Mindi                                           May 20, 1961              LDCT: 08/12/24 @ 330p/Opic    63 y.o.   Phone: [phone]  Lung Screening Narrative (confirm age 24-77 yrs Medicare / 50-80 yrs Private pay insurance)   Insurance information:[employer]   Referring Provider: Osa   This screening involves an initial phone call with a team member from our program. It is called a shared decision making visit. The initial meeting is required by insurance and Medicare to make sure you understand the program. This appointment takes about 15-20 minutes to complete. The CT scan will completed at a separate date/time. This scan takes about 5-10 minutes to complete and you may eat and drink before and after the scan.  Criteria questions for Lung Cancer Screening:   Are you a current or former smoker? Current Age began smoking: 14y   If you are a former smoker, what year did you quit smoking? NA   To calculate your smoking history, I need an accurate estimate of how many packs of cigarettes you smoked per day and for how many years. (Not just the number of PPD you are now smoking)   Years smoking 49 x Packs per day 1-2 = Pack years 74   (at least 20 pack yrs)   (Make sure they understand that we need to know how much they have smoked in the past, not just the number of PPD they are smoking now)  Do you have a personal history of cancer?  No    Do you have a family history of cancer? Yes  (cancer type and and relative) mother/female?type  Are you coughing up blood?  No  Have you had unexplained weight loss of 15 lbs or more in the last 6 months? No  It looks like you meet all criteria.     Additional information: NA

## 2024-08-09 ENCOUNTER — Ambulatory Visit: Admitting: *Deleted

## 2024-08-09 ENCOUNTER — Encounter: Payer: Self-pay | Admitting: *Deleted

## 2024-08-09 DIAGNOSIS — F17210 Nicotine dependence, cigarettes, uncomplicated: Secondary | ICD-10-CM | POA: Diagnosis not present

## 2024-08-09 NOTE — Progress Notes (Signed)
 Virtual Visit via Telephone Note  I connected with [name] on 08/09/2024 at 10:30 AM EST by telephone and verified that I am speaking with the correct person using two identifiers.  Location: Patient: at home Provider: [address], [place], [place], [address]    I discussed the limitations, risks, security and privacy concerns of performing an evaluation and management service by telephone and the availability of in person appointments. I also discussed with the patient that there may be a patient responsible charge related to this service. The patient expressed understanding and agreed to proceed.   Shared Decision Making Visit Lung Cancer Screening Program [phone])   Eligibility: Age 63 y.o. Pack Years Smoking History Calculation 22 (# packs/per year x # years smoked) Recent History of coughing up blood  no Unexplained weight loss? no ( >Than 15 pounds within the last 6 months ) Prior History Lung / other cancer no (Diagnosis within the last 5 years already requiring surveillance chest CT Scans). Smoking Status Current Smoker Former Smokers: Years since quit: n/a  Quit Date: n/a  Visit Components: Discussion included one or more decision making aids. yes Discussion included risk/benefits of screening. yes Discussion included potential follow up diagnostic testing for abnormal scans. yes Discussion included meaning and risk of over diagnosis. yes Discussion included meaning and risk of False Positives. yes Discussion included meaning of total radiation exposure. yes  Counseling Included: Importance of adherence to annual lung cancer LDCT screening. yes Impact of comorbidities on ability to participate in the program. yes Ability and willingness to under diagnostic treatment. yes  Smoking Cessation Counseling: Current Smokers:  Discussed importance of smoking cessation. yes Information about tobacco cessation classes and interventions provided to patient.  yes Patient provided with ticket for LDCT Scan. no Symptomatic Patient. no   Diagnosis Code: Tobacco Use Z72.0 Asymptomatic Patient yes  Smoking/Tobacco Cessation Counseling Karen Chen is a current user of tobacco or nicotine products. She is considering quitting at this time. Counseling provided today addressed the risks of continued use and the benefits of cessation. Discussed tobacco/nicotine use history, readiness to quit, and evidence-based treatment options including behavioral strategies, support resources, and pharmacologic therapies. Provided encouragement and educational materials on steps and resources to quit smoking. Patient questions were addressed, and follow-up recommended for continued support. Total time spent on counseling: 4 minutes.   Former Smokers:  Discussed the importance of maintaining cigarette abstinence. yes Diagnosis Code: Personal History of Nicotine Dependence. S12.108 Information about tobacco cessation classes and interventions provided to patient. Yes Patient provided with ticket for LDCT Scan. no Written Order for Lung Cancer Screening with LDCT placed in Epic. Yes (CT Chest Lung Cancer Screening Low Dose W/O CM) [id number] Z12.2-Screening of respiratory organs Z87.891-Personal history of nicotine dependence   Laneta Speaks, RN

## 2024-08-09 NOTE — Patient Instructions (Signed)

## 2024-08-12 ENCOUNTER — Ambulatory Visit: Admission: RE | Admit: 2024-08-12

## 2024-09-04 ENCOUNTER — Ambulatory Visit: Admission: RE | Admit: 2024-09-04 | Source: Ambulatory Visit

## 2024-09-06 ENCOUNTER — Ambulatory Visit: Admission: EM | Admit: 2024-09-06 | Discharge: 2024-09-06 | Disposition: A | Discharge status: Home / Self Care
